# Patient Record
Sex: Female | Born: 1940
Health system: Southern US, Community
[De-identification: ages and names within clinical notes are randomized; demographics above are authoritative.]

## PROBLEM LIST (undated history)

## (undated) DIAGNOSIS — E785 Hyperlipidemia, unspecified: Secondary | ICD-10-CM

## (undated) DIAGNOSIS — I679 Cerebrovascular disease, unspecified: Secondary | ICD-10-CM

## (undated) DIAGNOSIS — I10 Essential (primary) hypertension: Secondary | ICD-10-CM

## (undated) DIAGNOSIS — Z8489 Family history of other specified conditions: Secondary | ICD-10-CM

## (undated) DIAGNOSIS — I2089 Other forms of angina pectoris: Secondary | ICD-10-CM

## (undated) DIAGNOSIS — K640 First degree hemorrhoids: Secondary | ICD-10-CM

## (undated) DIAGNOSIS — I6529 Occlusion and stenosis of unspecified carotid artery: Secondary | ICD-10-CM

## (undated) DIAGNOSIS — H547 Unspecified visual loss: Secondary | ICD-10-CM

## (undated) DIAGNOSIS — M51369 Other intervertebral disc degeneration, lumbar region without mention of lumbar back pain or lower extremity pain: Secondary | ICD-10-CM

## (undated) DIAGNOSIS — I208 Other forms of angina pectoris: Secondary | ICD-10-CM

## (undated) DIAGNOSIS — E7030 Albinism, unspecified: Secondary | ICD-10-CM

## (undated) DIAGNOSIS — Q2112 Patent foramen ovale: Secondary | ICD-10-CM

## (undated) DIAGNOSIS — M5136 Other intervertebral disc degeneration, lumbar region: Secondary | ICD-10-CM

## (undated) DIAGNOSIS — I639 Cerebral infarction, unspecified: Secondary | ICD-10-CM

## (undated) DIAGNOSIS — M5430 Sciatica, unspecified side: Secondary | ICD-10-CM

## (undated) DIAGNOSIS — F419 Anxiety disorder, unspecified: Secondary | ICD-10-CM

## (undated) DIAGNOSIS — I2510 Atherosclerotic heart disease of native coronary artery without angina pectoris: Secondary | ICD-10-CM

## (undated) DIAGNOSIS — M8580 Other specified disorders of bone density and structure, unspecified site: Secondary | ICD-10-CM

## (undated) DIAGNOSIS — G609 Hereditary and idiopathic neuropathy, unspecified: Secondary | ICD-10-CM

## (undated) DIAGNOSIS — K5732 Diverticulitis of large intestine without perforation or abscess without bleeding: Secondary | ICD-10-CM

## (undated) DIAGNOSIS — Q211 Atrial septal defect: Secondary | ICD-10-CM

## (undated) HISTORY — DX: Diverticulitis of large intestine without perforation or abscess without bleeding: K57.32

## (undated) HISTORY — PX: TUBAL LIGATION: SHX77

## (undated) HISTORY — DX: Patent foramen ovale: Q21.12

## (undated) HISTORY — DX: Albinism, unspecified: E70.30

## (undated) HISTORY — DX: Atrial septal defect: Q21.1

## (undated) HISTORY — DX: Other forms of angina pectoris: I20.8

## (undated) HISTORY — DX: Essential (primary) hypertension: I10

## (undated) HISTORY — PX: TONSILLECTOMY: SUR1361

## (undated) HISTORY — DX: Anxiety disorder, unspecified: F41.9

## (undated) HISTORY — DX: First degree hemorrhoids: K64.0

## (undated) HISTORY — DX: Hereditary and idiopathic neuropathy, unspecified: G60.9

## (undated) HISTORY — DX: Occlusion and stenosis of unspecified carotid artery: I65.29

## (undated) HISTORY — DX: Cerebrovascular disease, unspecified: I67.9

## (undated) HISTORY — DX: Other forms of angina pectoris: I20.89

## (undated) HISTORY — DX: Other specified disorders of bone density and structure, unspecified site: M85.80

---

## 1998-04-15 HISTORY — PX: OTHER SURGICAL HISTORY: SHX169

## 2002-07-05 ENCOUNTER — Ambulatory Visit (HOSPITAL_BASED_OUTPATIENT_CLINIC_OR_DEPARTMENT_OTHER): Admission: RE | Admit: 2002-07-05 | Discharge: 2002-07-05 | Payer: Self-pay | Admitting: Otolaryngology

## 2003-01-06 ENCOUNTER — Ambulatory Visit (HOSPITAL_COMMUNITY): Admission: RE | Admit: 2003-01-06 | Discharge: 2003-01-06 | Payer: Self-pay | Admitting: Family Medicine

## 2003-01-06 ENCOUNTER — Encounter: Payer: Self-pay | Admitting: Family Medicine

## 2004-02-21 ENCOUNTER — Other Ambulatory Visit: Admission: RE | Admit: 2004-02-21 | Discharge: 2004-02-21 | Payer: Self-pay | Admitting: Family Medicine

## 2007-10-13 ENCOUNTER — Other Ambulatory Visit: Admission: RE | Admit: 2007-10-13 | Discharge: 2007-10-13 | Payer: Self-pay | Admitting: Family Medicine

## 2010-08-31 NOTE — Op Note (Signed)
NAME:  Kerri Snyder, Kerri Snyder                          ACCOUNT NO.:  [id number]   MEDICAL RECORD NO.:  [id number]                   PATIENT TYPE:  AMB   LOCATION:  DSC                                  FACILITY:  [facility]   PHYSICIAN:  Jefry H. Pollyann Kennedy, M.D.                DATE OF BIRTH:  [birth year]   DATE OF PROCEDURE:  07/05/2002  DATE OF DISCHARGE:                                 OPERATIVE REPORT   PREOPERATIVE DIAGNOSIS:  Right tympanic membrane perforation with conductive  hearing loss.   POSTOPERATIVE DIAGNOSIS:  Right tympanic membrane perforation with  conductive hearing loss.   PROCEDURE:  Right tympanoplasty.   SURGEON:  Jefry H. Pollyann Kennedy, M.D.   ANESTHESIA:  General endotracheal anesthesia.   COMPLICATIONS:  None.   ESTIMATED BLOOD LOSS:  10 cc.   FINDINGS:  Approximately 40% central perforation of the posteroinferior pars  tensa and tympanic membrane. The middle ear was healthy and clear and the  ossicular chamber was intact. The patient tolerated the procedure well, was  awakened, extubated and transferred to the recovery room in stable  condition.   INDICATIONS:  The patient is a 70 year old lady with a long history of  intermittent drainage and significant hearing loss from the right ear.  The  risks, benefits and alternatives and complications of the procedure were  explained to the patient and she seemed to understand.   DESCRIPTION OF PROCEDURE:  The patient was taken to the operating room and  placed on the operating table in the supine position. Following induction of  general endotracheal anesthesia the table was turned 90 degrees. The patient  was prepped and draped in the standard fashion.   The right ear canal was injected with 1% Xylocaine with epinephrine in 4  quadrants with good blanching of  the tympanic membrane.  The postauricular  sulcus was injected as well. A vascular strip was created posteriorly as was  a tympanomeatal flap. The edges of  the  perforation were freshened with an  otologic pick and cup forceps to remove the curled under epithelium.   The postauricular incision was created with a #15 scalpel. The temporalis  areolar loose tissue was harvested, pressed and dried  on the back table. A  self-retaining retractor was used to hold the auricle forward. The linea  temporalis and maxillary periosteum were incised and dissected off of the  bone. This was brought forward with the auricle and lymphovascularly was  stripped.   The ear canal and tympanic membrane were now nicely visualized from the  postauricular  approach. The tympanomeatal flap was developed anteriorly and  cleaned off of the bony annulus. The chorda tympani nerve was identified and  preserved. The flap including the tympanic membrane was dissected sharply  forward off the chorda tympani and off the lateral process of the malleus.  The ossicular chain was inspected and was intact with good mobility.  There  was no erosion. The middle ear was otherwise clear.   The middle ear was packed with saline soaked Gelfoam including the  eustachian tube orifice. The dried graft was trimmed and cut with a notch to  wrap around the malleus. It was then inserted in a medial position and well  suited up against the undersurface of the tympanic membrane remnant. The  remainder of the middle ear was packed with more saline soaked Gelfoam to  provide good support of the graft.   The graft and the tympanomeatal flap were brought back to their normal  position. The graft was well seated with all edges of the perforation in  contact. The ear canal was packed with Cortisporin soaked Gelfoam. The  postauricular incision was reapproximated with subcuticular chromic suture  and Benzoin and Steri-Strips and a mastoid dressing was applied. The patient  was  then awakened, extubated and transferred to the recovery room.                                               Jefry H. Pollyann Kennedy,  M.D.    JHR/MEDQ  D:  07/05/2002  T:  07/05/2002  Job:  [id number]   cc:   Dario Guardian, M.D.  [address]  [place]  [place] [address]  Fax: [phone]

## 2011-03-06 ENCOUNTER — Other Ambulatory Visit: Payer: Self-pay | Admitting: Neurological Surgery

## 2011-03-06 DIAGNOSIS — M5410 Radiculopathy, site unspecified: Secondary | ICD-10-CM

## 2011-03-06 DIAGNOSIS — M47819 Spondylosis without myelopathy or radiculopathy, site unspecified: Secondary | ICD-10-CM

## 2011-03-06 NOTE — Patient Instructions (Signed)

## 2011-03-08 ENCOUNTER — Ambulatory Visit
Admission: RE | Admit: 2011-03-08 | Discharge: 2011-03-08 | Disposition: A | Discharge status: Home / Self Care | Payer: Self-pay | Source: Ambulatory Visit | Attending: Neurological Surgery | Admitting: Neurological Surgery

## 2011-03-08 DIAGNOSIS — M5410 Radiculopathy, site unspecified: Secondary | ICD-10-CM

## 2011-03-08 DIAGNOSIS — M47819 Spondylosis without myelopathy or radiculopathy, site unspecified: Secondary | ICD-10-CM

## 2011-03-08 MED ORDER — DIAZEPAM 5 MG PO TABS
5.0000 mg | ORAL_TABLET | Freq: Once | ORAL | Status: AC
  Administered 2011-03-08: 5 mg via ORAL

## 2011-03-08 MED ORDER — IOHEXOL 180 MG/ML  SOLN
17.0000 mL | Freq: Once | INTRAMUSCULAR | Status: AC | PRN
  Administered 2011-03-08: 17 mL via INTRAVENOUS

## 2011-03-08 NOTE — Progress Notes (Signed)
Son at bedside.  Patient resting quietly without complaint.  jkl

## 2012-04-12 ENCOUNTER — Ambulatory Visit (INDEPENDENT_AMBULATORY_CARE_PROVIDER_SITE_OTHER): Payer: Medicare Other | Admitting: Family Medicine

## 2012-04-12 VITALS — BP 161/81 | HR 89 | Temp 97.3°F | Resp 16 | Ht 64.0 in | Wt 136.0 lb

## 2012-04-12 DIAGNOSIS — M5430 Sciatica, unspecified side: Secondary | ICD-10-CM

## 2012-04-12 DIAGNOSIS — M791 Myalgia, unspecified site: Secondary | ICD-10-CM

## 2012-04-12 DIAGNOSIS — IMO0001 Reserved for inherently not codable concepts without codable children: Secondary | ICD-10-CM

## 2012-04-12 MED ORDER — HYDROCODONE-ACETAMINOPHEN 5-325 MG PO TABS: 1.0000 | ORAL_TABLET | Freq: Four times a day (QID) | ORAL | Status: DC | PRN

## 2012-04-12 MED ORDER — METHOCARBAMOL 500 MG PO TABS: 500.0000 mg | ORAL_TABLET | Freq: Four times a day (QID) | ORAL | Status: DC | PRN

## 2012-04-12 MED ORDER — NAPROXEN 500 MG PO TABS: 500.0000 mg | ORAL_TABLET | Freq: Two times a day (BID) | ORAL | Status: DC
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
## 2012-04-15 DIAGNOSIS — Z0271 Encounter for disability determination: Secondary | ICD-10-CM

## 2013-06-13 DIAGNOSIS — I639 Cerebral infarction, unspecified: Secondary | ICD-10-CM

## 2013-06-13 HISTORY — DX: Cerebral infarction, unspecified: I63.9

## 2013-06-14 ENCOUNTER — Encounter (HOSPITAL_COMMUNITY): Payer: Self-pay | Admitting: Emergency Medicine

## 2013-06-14 ENCOUNTER — Inpatient Hospital Stay (HOSPITAL_COMMUNITY): Payer: Medicare Other

## 2013-06-14 ENCOUNTER — Inpatient Hospital Stay (HOSPITAL_COMMUNITY)
Admission: EM | Admit: 2013-06-14 | Discharge: 2013-06-17 | DRG: 066 | Disposition: A | Discharge status: Home / Self Care | Payer: Medicare Other | Attending: Internal Medicine | Admitting: Internal Medicine

## 2013-06-14 ENCOUNTER — Emergency Department (HOSPITAL_COMMUNITY): Payer: Medicare Other

## 2013-06-14 DIAGNOSIS — M51379 Other intervertebral disc degeneration, lumbosacral region without mention of lumbar back pain or lower extremity pain: Secondary | ICD-10-CM | POA: Diagnosis present

## 2013-06-14 DIAGNOSIS — R791 Abnormal coagulation profile: Secondary | ICD-10-CM | POA: Diagnosis present

## 2013-06-14 DIAGNOSIS — G459 Transient cerebral ischemic attack, unspecified: Secondary | ICD-10-CM | POA: Diagnosis present

## 2013-06-14 DIAGNOSIS — R4789 Other speech disturbances: Secondary | ICD-10-CM | POA: Diagnosis present

## 2013-06-14 DIAGNOSIS — R4781 Slurred speech: Secondary | ICD-10-CM | POA: Diagnosis present

## 2013-06-14 DIAGNOSIS — R030 Elevated blood-pressure reading, without diagnosis of hypertension: Secondary | ICD-10-CM | POA: Diagnosis present

## 2013-06-14 DIAGNOSIS — I6529 Occlusion and stenosis of unspecified carotid artery: Secondary | ICD-10-CM | POA: Diagnosis present

## 2013-06-14 DIAGNOSIS — Z79899 Other long term (current) drug therapy: Secondary | ICD-10-CM

## 2013-06-14 DIAGNOSIS — E785 Hyperlipidemia, unspecified: Secondary | ICD-10-CM | POA: Diagnosis present

## 2013-06-14 DIAGNOSIS — I6350 Cerebral infarction due to unspecified occlusion or stenosis of unspecified cerebral artery: Principal | ICD-10-CM | POA: Diagnosis present

## 2013-06-14 DIAGNOSIS — Z7982 Long term (current) use of aspirin: Secondary | ICD-10-CM

## 2013-06-14 DIAGNOSIS — Z833 Family history of diabetes mellitus: Secondary | ICD-10-CM

## 2013-06-14 DIAGNOSIS — IMO0001 Reserved for inherently not codable concepts without codable children: Secondary | ICD-10-CM | POA: Diagnosis present

## 2013-06-14 DIAGNOSIS — H548 Legal blindness, as defined in USA: Secondary | ICD-10-CM | POA: Diagnosis present

## 2013-06-14 DIAGNOSIS — Z8249 Family history of ischemic heart disease and other diseases of the circulatory system: Secondary | ICD-10-CM

## 2013-06-14 DIAGNOSIS — I739 Peripheral vascular disease, unspecified: Secondary | ICD-10-CM | POA: Diagnosis present

## 2013-06-14 DIAGNOSIS — M5137 Other intervertebral disc degeneration, lumbosacral region: Secondary | ICD-10-CM | POA: Diagnosis present

## 2013-06-14 DIAGNOSIS — E782 Mixed hyperlipidemia: Secondary | ICD-10-CM | POA: Diagnosis present

## 2013-06-14 DIAGNOSIS — I639 Cerebral infarction, unspecified: Secondary | ICD-10-CM

## 2013-06-14 HISTORY — DX: Hyperlipidemia, unspecified: E78.5

## 2013-06-14 HISTORY — DX: Family history of other specified conditions: Z84.89

## 2013-06-14 HISTORY — DX: Other intervertebral disc degeneration, lumbar region without mention of lumbar back pain or lower extremity pain: M51.369

## 2013-06-14 HISTORY — DX: Unspecified visual loss: H54.7

## 2013-06-14 HISTORY — DX: Sciatica, unspecified side: M54.30

## 2013-06-14 HISTORY — DX: Other intervertebral disc degeneration, lumbar region: M51.36

## 2013-06-14 LAB — URINALYSIS, ROUTINE W REFLEX MICROSCOPIC
BILIRUBIN URINE: NEGATIVE
GLUCOSE, UA: NEGATIVE mg/dL
HGB URINE DIPSTICK: NEGATIVE
KETONES UR: NEGATIVE mg/dL
Leukocytes, UA: NEGATIVE
Nitrite: NEGATIVE
PH: 7 (ref 5.0–8.0)
PROTEIN: NEGATIVE mg/dL
Specific Gravity, Urine: 1.016 (ref 1.005–1.030)
Urobilinogen, UA: 0.2 mg/dL (ref 0.0–1.0)

## 2013-06-14 LAB — COMPREHENSIVE METABOLIC PANEL
ALT: 16 U/L (ref 0–35)
AST: 21 U/L (ref 0–37)
Albumin: 4 g/dL (ref 3.5–5.2)
Alkaline Phosphatase: 100 U/L (ref 39–117)
BUN: 19 mg/dL (ref 6–23)
CALCIUM: 9.9 mg/dL (ref 8.4–10.5)
CO2: 24 mEq/L (ref 19–32)
CREATININE: 0.72 mg/dL (ref 0.50–1.10)
Chloride: 103 mEq/L (ref 96–112)
GFR calc non Af Amer: 83 mL/min — ABNORMAL LOW (ref >90)
GLUCOSE: 104 mg/dL — AB (ref 70–99)
Potassium: 4.3 mEq/L (ref 3.7–5.3)
SODIUM: 141 meq/L (ref 137–147)
TOTAL PROTEIN: 7.6 g/dL (ref 6.0–8.3)
Total Bilirubin: 0.3 mg/dL (ref 0.3–1.2)

## 2013-06-14 LAB — DIFFERENTIAL
Basophils Absolute: 0 10*3/uL (ref 0.0–0.1)
Basophils Relative: 0 % (ref 0–1)
EOS ABS: 0.1 10*3/uL (ref 0.0–0.7)
EOS PCT: 1 % (ref 0–5)
LYMPHS ABS: 2.1 10*3/uL (ref 0.7–4.0)
Lymphocytes Relative: 26 % (ref 12–46)
MONO ABS: 0.8 10*3/uL (ref 0.1–1.0)
Monocytes Relative: 10 % (ref 3–12)
Neutro Abs: 5.1 10*3/uL (ref 1.7–7.7)
Neutrophils Relative %: 63 % (ref 43–77)

## 2013-06-14 LAB — RAPID URINE DRUG SCREEN, HOSP PERFORMED
AMPHETAMINES: NOT DETECTED
BENZODIAZEPINES: NOT DETECTED
Barbiturates: NOT DETECTED
COCAINE: NOT DETECTED
OPIATES: NOT DETECTED
Tetrahydrocannabinol: NOT DETECTED

## 2013-06-14 LAB — CBC
HCT: 42.6 % (ref 36.0–46.0)
Hemoglobin: 14.5 g/dL (ref 12.0–15.0)
MCH: 30.1 pg (ref 26.0–34.0)
MCHC: 34 g/dL (ref 30.0–36.0)
MCV: 88.6 fL (ref 78.0–100.0)
Platelets: 281 10*3/uL (ref 150–400)
RBC: 4.81 MIL/uL (ref 3.87–5.11)
RDW: 12.8 % (ref 11.5–15.5)
WBC: 8.1 10*3/uL (ref 4.0–10.5)

## 2013-06-14 LAB — APTT
APTT: 30 s (ref 24–37)
aPTT: >200 seconds (ref 24–37)

## 2013-06-14 LAB — I-STAT CHEM 8, ED
BUN: 20 mg/dL (ref 6–23)
Calcium, Ion: 1.21 mmol/L (ref 1.13–1.30)
Chloride: 107 mEq/L (ref 96–112)
Creatinine, Ser: 0.8 mg/dL (ref 0.50–1.10)
Glucose, Bld: 105 mg/dL — ABNORMAL HIGH (ref 70–99)
HEMATOCRIT: 43 % (ref 36.0–46.0)
HEMOGLOBIN: 14.6 g/dL (ref 12.0–15.0)
POTASSIUM: 3.9 meq/L (ref 3.7–5.3)
SODIUM: 143 meq/L (ref 137–147)
TCO2: 26 mmol/L (ref 0–100)

## 2013-06-14 LAB — PROTIME-INR
INR: >10 (ref 0.00–1.49)
INR: 0.93 (ref 0.00–1.49)
PROTHROMBIN TIME: 12.3 s (ref 11.6–15.2)

## 2013-06-14 LAB — I-STAT TROPONIN, ED: Troponin i, poc: 0 ng/mL (ref 0.00–0.08)

## 2013-06-14 LAB — CBG MONITORING, ED: Glucose-Capillary: 111 mg/dL — ABNORMAL HIGH (ref 70–99)

## 2013-06-14 LAB — ETHANOL: Alcohol, Ethyl (B): <11 mg/dL (ref 0–11)

## 2013-06-14 MED ORDER — ASPIRIN 325 MG PO TABS
325.0000 mg | ORAL_TABLET | Freq: Every day | ORAL | Status: DC
  Administered 2013-06-14 – 2013-06-15 (×2): 325 mg via ORAL
  Filled 2013-06-14 (×2): qty 1

## 2013-06-14 MED ORDER — ACETAMINOPHEN 325 MG PO TABS: 650.0000 mg | ORAL_TABLET | ORAL | Status: DC | PRN

## 2013-06-14 MED ORDER — SENNOSIDES-DOCUSATE SODIUM 8.6-50 MG PO TABS
1.0000 | ORAL_TABLET | Freq: Every evening | ORAL | Status: DC | PRN
  Filled 2013-06-14: qty 1

## 2013-06-14 MED ORDER — ACETAMINOPHEN 650 MG RE SUPP: 650.0000 mg | RECTAL | Status: DC | PRN

## 2013-06-14 MED ORDER — SODIUM CHLORIDE 0.9 % IV SOLN
INTRAVENOUS | Status: DC
  Administered 2013-06-14: 1000 mL via INTRAVENOUS

## 2013-06-14 MED ORDER — ASPIRIN 300 MG RE SUPP
300.0000 mg | Freq: Every day | RECTAL | Status: DC
  Filled 2013-06-14 (×2): qty 1

## 2013-06-14 MED ORDER — ATORVASTATIN CALCIUM 20 MG PO TABS
20.0000 mg | ORAL_TABLET | Freq: Every day | ORAL | Status: DC
  Filled 2013-06-14 (×2): qty 1

## 2013-06-14 NOTE — ED Provider Notes (Signed)
CSN: [id number]     Arrival date & time 06/14/13  1537 History   First MD Initiated Contact with Patient 06/14/13 1559     Chief Complaint  Patient presents with  . Stroke Symptoms     HPI Pt was seen at 1600. Per pt, c/o gradual onset and persistence of constant "slurred speech" since waking up this morning approx 0500/0530. States she noticed her "tongue wasn't working right" when she was saying her morning prayers. States she went to bed last night approx 2230 "just fine." Denies hx of same. Denies focal motor weakness, no tingling/numbness in extremities, no dysphagia/choking, no CP/palpitations, no SOB/cough, no abd pain, no N/V/D.    Past Medical History  Diagnosis Date  . Blind   . Hyperlipidemia   . DDD (degenerative disc disease), lumbar   . Sciatica    Past Surgical History  Procedure Laterality Date  . Tubal ligation    . Cesarean section     Family History  Problem Relation Age of Onset  . Diabetes Father   . Heart disease Father    History  Substance Use Topics  . Smoking status: Never Smoker   . Smokeless tobacco: Not on file  . Alcohol Use: No    Review of Systems ROS: Statement: All systems negative except as marked or noted in the HPI; Constitutional: Negative for fever and chills. ; ; Eyes: Negative for eye pain, redness and discharge. ; ; ENMT: Negative for ear pain, hoarseness, nasal congestion, sinus pressure and sore throat. ; ; Cardiovascular: Negative for chest pain, palpitations, diaphoresis, dyspnea and peripheral edema. ; ; Respiratory: Negative for cough, wheezing and stridor. ; ; Gastrointestinal: Negative for nausea, vomiting, diarrhea, abdominal pain, blood in stool, hematemesis, jaundice and rectal bleeding. . ; ; Genitourinary: Negative for dysuria, flank pain and hematuria. ; ; Musculoskeletal: Negative for back pain and neck pain. Negative for swelling and trauma.; ; Skin: Negative for pruritus, rash, abrasions, blisters, bruising and skin  lesion.; ; Neuro: +slurred speech. Negative for headache, lightheadedness and neck stiffness. Negative for weakness, altered level of consciousness , altered mental status, extremity weakness, paresthesias, involuntary movement, seizure and syncope.     Allergies  Review of patient's allergies indicates no known allergies.  Home Medications   Current Outpatient Rx  Name  Route  Sig  Dispense  Refill  . HYDROcodone-acetaminophen (NORCO/VICODIN) 5-325 MG per tablet   Oral   Take 1 tablet by mouth every 6 (six) hours as needed for pain.   15 tablet   0   . methocarbamol (ROBAXIN) 500 MG tablet   Oral   Take 1 tablet (500 mg total) by mouth 4 (four) times daily as needed (muscle spasm).   60 tablet   0   . naproxen (NAPROSYN) 500 MG tablet   Oral   Take 1 tablet (500 mg total) by mouth 2 (two) times daily with a meal.   60 tablet   0   . rosuvastatin (CRESTOR) 10 MG tablet   Oral   Take 10 mg by mouth daily.          BP 162/77  Pulse 98  Temp(Src) 97.9 F (36.6 C)  Resp 15  SpO2 100% Physical Exam 1605: Physical examination:  Nursing notes reviewed; Vital signs and O2 SAT reviewed;  Constitutional: Well developed, Well nourished, Well hydrated, In no acute distress; Head:  Normocephalic, atraumatic; Eyes: EOMI, PERRL, No scleral icterus; ENMT: Mouth and pharynx normal, Mucous membranes moist; Neck: Supple, Full  range of motion, No lymphadenopathy; Cardiovascular: Regular rate and rhythm, No gallop; Respiratory: Breath sounds clear & equal bilaterally, No rales, rhonchi, wheezes.  Speaking full sentences with ease, Normal respiratory effort/excursion; Chest: Nontender, Movement normal; Abdomen: Soft, Nontender, Nondistended, Normal bowel sounds; Genitourinary: No CVA tenderness; Extremities: Pulses normal, No tenderness, No edema, No calf edema or asymmetry.; Neuro: AA&Ox3, +blind per hx, otherwise major CN grossly intact. Speech mildly slurred.  No facial droop.  +bilateral  nystagmus. Grips unequal: right weaker than left. Strength 4/5 RUE, strength bilat LE's and LLE.  DTR 2/4 equal bilat UE's and LE's.  No gross sensory deficits.  Normal cerebellar testing bilat UE's (finger-nose) and LE's (heel-shin).; Skin: Color normal, Warm, Dry.   ED Course  Procedures     EKG Interpretation None      MDM  MDM Reviewed: previous chart, nursing note and vitals Reviewed previous: labs and ECG Interpretation: labs, ECG, x-ray and CT scan     Results for orders placed during the hospital encounter of 06/14/13  ETHANOL      Result Value Ref Range   Alcohol, Ethyl (B) <11  0 - 11 mg/dL  PROTIME-INR      Result Value Ref Range   Prothrombin Time >90.0 (*) 11.6 - 15.2 seconds   INR >10.00 (*) 0.00 - 1.49  APTT      Result Value Ref Range   aPTT >200 (*) 24 - 37 seconds  CBC      Result Value Ref Range   WBC 8.1  4.0 - 10.5 K/uL   RBC 4.81  3.87 - 5.11 MIL/uL   Hemoglobin 14.5  12.0 - 15.0 g/dL   HCT 16.1  09.6 - 04.5 %   MCV 88.6  78.0 - 100.0 fL   MCH 30.1  26.0 - 34.0 pg   MCHC 34.0  30.0 - 36.0 g/dL   RDW 40.9  81.1 - 91.4 %   Platelets 281  150 - 400 K/uL  DIFFERENTIAL      Result Value Ref Range   Neutrophils Relative % 63  43 - 77 %   Neutro Abs 5.1  1.7 - 7.7 K/uL   Lymphocytes Relative 26  12 - 46 %   Lymphs Abs 2.1  0.7 - 4.0 K/uL   Monocytes Relative 10  3 - 12 %   Monocytes Absolute 0.8  0.1 - 1.0 K/uL   Eosinophils Relative 1  0 - 5 %   Eosinophils Absolute 0.1  0.0 - 0.7 K/uL   Basophils Relative 0  0 - 1 %   Basophils Absolute 0.0  0.0 - 0.1 K/uL  COMPREHENSIVE METABOLIC PANEL      Result Value Ref Range   Sodium 141  137 - 147 mEq/L   Potassium 4.3  3.7 - 5.3 mEq/L   Chloride 103  96 - 112 mEq/L   CO2 24  19 - 32 mEq/L   Glucose, Bld 104 (*) 70 - 99 mg/dL   BUN 19  6 - 23 mg/dL   Creatinine, Ser 7.82  0.50 - 1.10 mg/dL   Calcium 9.9  8.4 - 95.6 mg/dL   Total Protein 7.6  6.0 - 8.3 g/dL   Albumin 4.0  3.5 - 5.2 g/dL   AST  21  0 - 37 U/L   ALT 16  0 - 35 U/L   Alkaline Phosphatase 100  39 - 117 U/L   Total Bilirubin 0.3  0.3 - 1.2 mg/dL   GFR calc  non Af Amer 83 (*) >90 mL/min   GFR calc Af Amer >90  >90 mL/min  I-STAT CHEM 8, ED      Result Value Ref Range   Sodium 143  137 - 147 mEq/L   Potassium 3.9  3.7 - 5.3 mEq/L   Chloride 107  96 - 112 mEq/L   BUN 20  6 - 23 mg/dL   Creatinine, Ser 0.980.80  0.50 - 1.10 mg/dL   Glucose, Bld 119105 (*) 70 - 99 mg/dL   Calcium, Ion 1.471.21  8.291.13 - 1.30 mmol/L   TCO2 26  0 - 100 mmol/L   Hemoglobin 14.6  12.0 - 15.0 g/dL   HCT 56.243.0  13.036.0 - 86.546.0 %  I-STAT TROPOININ, ED      Result Value Ref Range   Troponin i, poc 0.00  0.00 - 0.08 ng/mL   Comment 3           CBG MONITORING, ED      Result Value Ref Range   Glucose-Capillary 111 (*) 70 - 99 mg/dL   Ct Head Wo Contrast 7/8/46963/05/2013   CLINICAL DATA:  Onset of slurred speech this morning  EXAM: CT HEAD WITHOUT CONTRAST  TECHNIQUE: Contiguous axial images were obtained from the base of the skull through the vertex without intravenous contrast.  COMPARISON:  None.  FINDINGS: The ventricles are normal in size and position. There is mild age appropriate diffuse cerebral atrophy. There is no shift of the midline. There is no evidence of an acute intracranial hemorrhage nor of an evolving ischemic infarction. There are no abnormal intracranial calcifications. There are punctate basal ganglia calcifications bilaterally.  At bone window settings there are air-fluid levels within the maxillary and sphenoid sinuses as well as a left ethmoid sinus cell. The frontals sinuses are clear. The mastoid air cells are well pneumatized. There is no evidence of an acute skull fracture.  IMPRESSION: 1. There is mild age appropriate diffuse cerebral and cerebellar atrophy. 2. There is no evidence of an acute ischemic or hemorrhagic infarction. 3. The air-fluid levels and at mucoperiosteal thickening within the maxillary and sphenoid and left ethmoid sinus  cells is consistent with acute sinusitis.   Electronically Signed   By: David  [place]   On: 06/14/2013 16:40    1725:  Pt not code stroke or TPA candidate due to delay in presentation and pt woke up with symptoms/LKW last night.  Neuro exam unchanged. Pt ambulatory with steady gait, easy resps, NAD.  PT/INR/PTT elevated, but pt does not take coumadin; will re-check. MRI brain ordered. Dx and testing d/w pt.  Questions answered.  Verb understanding, agreeable to admit.T/C to [facility] Dr. Janee Mornhompson, case discussed, including:  HPI, pertinent PM/SHx, VS/PE, dx testing, ED course and treatment:  Agreeable to observation admit, requests to write temporary orders, call Neuro MD for consult, obtain tele bed to team 10.  T/C to Neuro MD, case discussed, including:  HPI, pertinent PM/SHx, VS/PE, dx testing, ED course and treatment:  Agreeable to consult.        Laray AngerKathleen M Caedan Sumler, DO 06/16/13 2018

## 2013-06-14 NOTE — ED Notes (Signed)
Pt ambulated to restroom without any difficulty.  Gait steady.

## 2013-06-14 NOTE — ED Notes (Signed)
Dr. Thompson at bedside. 

## 2013-06-14 NOTE — Consult Note (Addendum)
Referring Physician: Janee Mornhompson    Chief Complaint: Slurred speech  HPI: Kerri Snyder is an 73 y.o. female who reports going to bed normal last evening.  She awakened this morning at 0530 and noted slurred speech.  She describes it as feeling as if her tongue would not move fast enough.  She was saying her prayers and noticed that she sounded funny. Patient had breakfast and subsequently went to work.  At work her supervisor also noticed her slurred speech and recommended she present to the ED.  Her symptoms have improved somewhat since presentation.  She does not report any weakness or numbness.  Date last known well: Date: 06/13/2013 Time last known well: Time: 22:30 tPA Given: No: Outside time window  Past Medical History  Diagnosis Date  . Blind   . Hyperlipidemia   . DDD (degenerative disc disease), lumbar   . Sciatica     Past Surgical History  Procedure Laterality Date  . Tubal ligation    . Cesarean section      Family History  Problem Relation Age of Onset  . Diabetes Father   . Heart disease Father    Social History:  reports that she has never smoked. She does not have any smokeless tobacco history on file. She reports that she does not drink alcohol or use illicit drugs.  Allergies: No Known Allergies  Medications:  I have reviewed the patient's current medications. Prior to Admission:  Prescriptions prior to admission  Medication Sig Dispense Refill  . Ascorbic Acid (VITAMIN C PO) Take 1 tablet by mouth every morning.      Marland Kitchen. aspirin 81 MG tablet Take 81 mg by mouth every morning.      . Cholecalciferol (VITAMIN D PO) Take 1 tablet by mouth every morning.      . Cyanocobalamin (B-12 PO) Take 1 tablet by mouth every morning.      . pseudoephedrine (SUDAFED) 30 MG tablet Take 30 mg by mouth every 4 (four) hours as needed for congestion.      . rosuvastatin (CRESTOR) 10 MG tablet Take 10 mg by mouth at bedtime.        Scheduled:  ROS: History obtained from the  patient  General ROS: negative for - chills, fatigue, fever, night sweats, weight gain or weight loss Psychological ROS: negative for - behavioral disorder, hallucinations, memory difficulties, mood swings or suicidal ideation Ophthalmic ROS: poor vision ENT ROS: negative for - epistaxis, nasal discharge, oral lesions, sore throat, tinnitus or vertigo Allergy and Immunology ROS: negative for - hives or itchy/watery eyes Hematological and Lymphatic ROS: negative for - bleeding problems, bruising or swollen lymph nodes Endocrine ROS: negative for - galactorrhea, hair pattern changes, polydipsia/polyuria or temperature intolerance Respiratory ROS: negative for - cough, hemoptysis, shortness of breath or wheezing Cardiovascular ROS: negative for - chest pain, dyspnea on exertion, edema or irregular heartbeat Gastrointestinal ROS: negative for - abdominal pain, diarrhea, hematemesis, nausea/vomiting or stool incontinence Genito-Urinary ROS: negative for - dysuria, hematuria, incontinence or urinary frequency/urgency Musculoskeletal ROS: negative for - joint swelling or muscular weakness Neurological ROS: as noted in HPI Dermatological ROS: negative for rash and skin lesion changes  Physical Examination: Blood pressure 165/69, pulse 87, temperature 98.1 F (36.7 C), temperature source Oral, resp. rate 16, SpO2 99.00%.  Neurologic Examination: Mental Status: Alert, oriented, thought content appropriate.  Speech fluent without evidence of aphasia.  Speech mildly slurred.  Able to follow 3 step commands without difficulty. Cranial Nerves: II: Discs flat  bilaterally; Visual fields grossly normal, pupils equal, round, reactive to light and accommodation III,IV, VI: left ptosis, extra-ocular motions intact bilaterally with nystagmus in all positions V,VII: smile symmetric, facial light touch sensation normal bilaterally VIII: hearing normal bilaterally IX,X: gag reflex present XI: bilateral  shoulder shrug XII: midline tongue extension Motor: Right : Upper extremity   5/5 with pronator drift    Left:     Upper extremity   5/5  Lower extremity   5/5        Lower extremity   5/5 Tone and bulk:normal tone throughout; no atrophy noted Sensory: Pinprick and light touch intact throughout, bilaterally Deep Tendon Reflexes: 3+ and symmetric throughout Plantars: Right: mute   Left: mute Cerebellar: normal finger-to-nose and normal heel-to-shin test Gait: Unable to test CV: pulses palpable throughout    Laboratory Studies:  Basic Metabolic Panel:  Recent Labs Lab 06/14/13 1612 06/14/13 1631  NA 141 143  K 4.3 3.9  CL 103 107  CO2 24  --   GLUCOSE 104* 105*  BUN 19 20  CREATININE 0.72 0.80  CALCIUM 9.9  --     Liver Function Tests:  Recent Labs Lab 06/14/13 1612  AST 21  ALT 16  ALKPHOS 100  BILITOT 0.3  PROT 7.6  ALBUMIN 4.0   No results found for this basename: LIPASE, AMYLASE,  in the last 168 hours No results found for this basename: AMMONIA,  in the last 168 hours  CBC:  Recent Labs Lab 06/14/13 1612 06/14/13 1631  WBC 8.1  --   NEUTROABS 5.1  --   HGB 14.5 14.6  HCT 42.6 43.0  MCV 88.6  --   PLT 281  --     Cardiac Enzymes: No results found for this basename: CKTOTAL, CKMB, CKMBINDEX, TROPONINI,  in the last 168 hours  BNP: No components found with this basename: POCBNP,   CBG:  Recent Labs Lab 06/14/13 1551  GLUCAP 111*    Microbiology: No results found for this or any previous visit.  Coagulation Studies:  Recent Labs  06/14/13 1612 06/14/13 1724  LABPROT >90.0* 12.3  INR >10.00* 0.93    Urinalysis:  Recent Labs Lab 06/14/13 1727  COLORURINE YELLOW  LABSPEC 1.016  PHURINE 7.0  GLUCOSEU NEGATIVE  HGBUR NEGATIVE  BILIRUBINUR NEGATIVE  KETONESUR NEGATIVE  PROTEINUR NEGATIVE  UROBILINOGEN 0.2  NITRITE NEGATIVE  LEUKOCYTESUR NEGATIVE    Lipid Panel: No results found for this basename: chol, trig, hdl,  cholhdl, vldl, ldlcalc    HgbA1C:  No results found for this basename: HGBA1C    Urine Drug Screen:     Component Value Date/Time   LABOPIA NONE DETECTED 06/14/2013 1727   COCAINSCRNUR NONE DETECTED 06/14/2013 1727   LABBENZ NONE DETECTED 06/14/2013 1727   AMPHETMU NONE DETECTED 06/14/2013 1727   THCU NONE DETECTED 06/14/2013 1727   LABBARB NONE DETECTED 06/14/2013 1727    Alcohol Level:  Recent Labs Lab 06/14/13 1612  ETH <11    Other results: EKG: sinus rhythm at 94 bpm.  Imaging: Ct Head Wo Contrast  06/14/2013   CLINICAL DATA:  Onset of slurred speech this morning  EXAM: CT HEAD WITHOUT CONTRAST  TECHNIQUE: Contiguous axial images were obtained from the base of the skull through the vertex without intravenous contrast.  COMPARISON:  None.  FINDINGS: The ventricles are normal in size and position. There is mild age appropriate diffuse cerebral atrophy. There is no shift of the midline. There is no evidence of an acute  intracranial hemorrhage nor of an evolving ischemic infarction. There are no abnormal intracranial calcifications. There are punctate basal ganglia calcifications bilaterally.  At bone window settings there are air-fluid levels within the maxillary and sphenoid sinuses as well as a left ethmoid sinus cell. The frontals sinuses are clear. The mastoid air cells are well pneumatized. There is no evidence of an acute skull fracture.  IMPRESSION: 1. There is mild age appropriate diffuse cerebral and cerebellar atrophy. 2. There is no evidence of an acute ischemic or hemorrhagic infarction. 3. The air-fluid levels and at mucoperiosteal thickening within the maxillary and sphenoid and left ethmoid sinus cells is consistent with acute sinusitis.   Electronically Signed   By: David  [place]   On: 06/14/2013 16:40   Mr Maxine Glenn Head Wo Contrast  06/14/2013   CLINICAL DATA:  Slurred speech and right upper extremity weakness.  EXAM: MRI HEAD WITHOUT CONTRAST  MRA HEAD WITHOUT CONTRAST  TECHNIQUE:  Multiplanar, multiecho pulse sequences of the brain and surrounding structures were obtained without intravenous contrast. Angiographic images of the head were obtained using MRA technique without contrast.  COMPARISON:  Head CT 06/14/2013  FINDINGS: MRI HEAD FINDINGS  There is a small, acute infarct involving the left lentiform nucleus and corona radiata. Linear susceptibility artifact along the superior right cerebellar folia may reflect remote hemorrhage, possibly subarachnoid. There is no other evidence of intracranial hemorrhage.  Mild age-related cerebral atrophy is noted. Small, scattered foci of T2 hyperintensity within the subcortical and deep cerebral white matter are nonspecific but compatible with mild chronic small vessel ischemic disease. There is no evidence of mass, midline shift, or extra-axial fluid collection.  Major intracranial vascular flow voids appear preserved. There are bilateral mastoid effusions. A small amount of fluid is present within both maxillary sinuses. Mild mucosal thickening is present in the maxillary sinuses, sphenoid sinuses, and ethmoid air cells bilaterally. There is slight anterolisthesis of C3 on C4 and C4 on C5.  MRA HEAD FINDINGS  The visualized distal vertebral arteries are patent with the left being dominant. Right PICA origin is patent. Left PICA is not clearly identified. The left AICA is dominant. SCAs are patent. The proximal right PCA is tortuous but otherwise unremarkable. The left P1 segment is hypoplastic with a prominent left posterior communicating artery identified. The left PCA is otherwise unremarkable.  Internal carotid arteries are patent from skullbase to carotid termini. Small inferiorly directed outpouching from the supraclinoid right ICA measuring approximately 2 mm likely represents a P-comm infundibulum. ACAs and MCAs are unremarkable. There is a small, patent anterior communicating artery.  IMPRESSION: 1. Small, acute left basal ganglia infarct.  2. Mild chronic small vessel ischemic disease. 3. Bilateral mastoid effusions and bilateral paranasal sinus mucosal thickening and fluid as above. 4. No evidence of major intracranial arterial occlusion or flow limiting stenosis. 2 mm right P-comm infundibulum favored over small aneurysm.   Electronically Signed   By: Sebastian Ache   On: 06/14/2013 19:50   Mr Brain Wo Contrast  06/14/2013   CLINICAL DATA:  Slurred speech and right upper extremity weakness.  EXAM: MRI HEAD WITHOUT CONTRAST  MRA HEAD WITHOUT CONTRAST  TECHNIQUE: Multiplanar, multiecho pulse sequences of the brain and surrounding structures were obtained without intravenous contrast. Angiographic images of the head were obtained using MRA technique without contrast.  COMPARISON:  Head CT 06/14/2013  FINDINGS: MRI HEAD FINDINGS  There is a small, acute infarct involving the left lentiform nucleus and corona radiata. Linear susceptibility artifact along the  superior right cerebellar folia may reflect remote hemorrhage, possibly subarachnoid. There is no other evidence of intracranial hemorrhage.  Mild age-related cerebral atrophy is noted. Small, scattered foci of T2 hyperintensity within the subcortical and deep cerebral white matter are nonspecific but compatible with mild chronic small vessel ischemic disease. There is no evidence of mass, midline shift, or extra-axial fluid collection.  Major intracranial vascular flow voids appear preserved. There are bilateral mastoid effusions. A small amount of fluid is present within both maxillary sinuses. Mild mucosal thickening is present in the maxillary sinuses, sphenoid sinuses, and ethmoid air cells bilaterally. There is slight anterolisthesis of C3 on C4 and C4 on C5.  MRA HEAD FINDINGS  The visualized distal vertebral arteries are patent with the left being dominant. Right PICA origin is patent. Left PICA is not clearly identified. The left AICA is dominant. SCAs are patent. The proximal right PCA is  tortuous but otherwise unremarkable. The left P1 segment is hypoplastic with a prominent left posterior communicating artery identified. The left PCA is otherwise unremarkable.  Internal carotid arteries are patent from skullbase to carotid termini. Small inferiorly directed outpouching from the supraclinoid right ICA measuring approximately 2 mm likely represents a P-comm infundibulum. ACAs and MCAs are unremarkable. There is a small, patent anterior communicating artery.  IMPRESSION: 1. Small, acute left basal ganglia infarct. 2. Mild chronic small vessel ischemic disease. 3. Bilateral mastoid effusions and bilateral paranasal sinus mucosal thickening and fluid as above. 4. No evidence of major intracranial arterial occlusion or flow limiting stenosis. 2 mm right P-comm infundibulum favored over small aneurysm.   Electronically Signed   By: Sebastian Ache   On: 06/14/2013 19:50    Assessment: 73 y.o. female presenting with a complaint of slurred speech.  Neurological examination is otherwise unremarkable other than a right pronator drift.  Patient outside time window for tPA.  Head CT reviewed and shows no acute changes.  MRI of the brain reviewed as well and shows an acute left basal ganglia infarct.  MRA shows no significant stenosis.  Further work up recommended.  Stroke Risk Factors - hyperlipidemia  Plan: 1. HgbA1c, fasting lipid panel 2. PT consult, OT consult, Speech consult 3. Echocardiogram 4. Carotid dopplers 5. Prophylactic therapy-Antiplatelet med: Aspirin - dose 325mg  daily 6. Risk factor modification 7. Telemetry monitoring 8. Frequent neuro checks   Thana Farr, MD [facility] [phone] 06/14/2013, 8:38 PM

## 2013-06-14 NOTE — ED Notes (Signed)
Kim RN from floor aware pt will go to MRI then to floor. MRI aware to contact ED staff when pt completed MRI to take to floor. Pt belongings with patient to MRI. Pt tele transported to MRI by Lafe GarinBarb J. RN

## 2013-06-14 NOTE — ED Notes (Signed)
CBG 111 

## 2013-06-14 NOTE — ED Notes (Signed)
Lab called for redraw PT/PTT/INR results sts appx 20 min to result. Dr. Janee Mornhompson made aware sts patient can still go up to floor. sts patient can have heart healthy diet.   MRI called sts can send for patient now.

## 2013-06-14 NOTE — ED Notes (Signed)
CRITICAL LAB VALUES  PT >90  INR >10  PTT >200  Dr. Clarene DukeMcmanus made aware.

## 2013-06-14 NOTE — H&P (Signed)
[facility] History and Physical  Kerri CaffeyDoris J Snyder ZOX:[id number] DOB: Aug 17, 1940 DOA: 06/14/2013  Referring physician: Dr. Clarene DukeMcManus PCP: Allean FoundSMITH,CANDACE THIELE, MD   Chief Complaint: Slurred speech  HPI: Kerri CaffeyDoris J Snyder is a 73 y.o. female  With history of legal blindness, hyperlipidemia, degenerative disc disease who presents to the ED with slurred speech. The patient stated she went to bed around 10:30 PM the night prior to admission and awoke up around 5 AM on the morning of admission. Patient stated that was saying her prayers she noticed she had slurred speech and sounded funny. Patient stated she breakfast subsequently went to work and was at work the supervisor also noticed a slurred speech and recommended she present to the ED. Patient denies any asymmetric weakness. No facial droop. No weakness. No other associated symptoms. Patient denies any fevers, no chills, no chest pain, no shortness of breath, no nausea, no vomiting, no abdominal pain, no diarrhea, no constipation, no dysuria, no weakness, no cough. Patient states he takes a baby aspirin intermittently. Patient states has had some improvement with speech. Patient was seen in the ED secondary to delay in presentation was deemed not a TPA candidate. CT of the head which was obtained was negative for any acute abnormalities. Compressive metabolic profile obtained was unremarkable. CBC obtained was unremarkable. INR obtained was greater than 10. APTT was greater than 200. Urinalysis was negative. We were called to admit the patient for further evaluation and management.   Review of Systems: As per history of present illness otherwise negative. Constitutional:  No weight loss, night sweats, Fevers, chills, fatigue.  HEENT:  No headaches, Difficulty swallowing,Tooth/dental problems,Sore throat,  No sneezing, itching, ear ache, nasal congestion, post nasal drip,  Cardio-vascular:  No chest pain, Orthopnea, PND, swelling in lower  extremities, anasarca, dizziness, palpitations  GI:  No heartburn, indigestion, abdominal pain, nausea, vomiting, diarrhea, change in bowel habits, loss of appetite  Resp:  No shortness of breath with exertion or at rest. No excess mucus, no productive cough, No non-productive cough, No coughing up of blood.No change in color of mucus.No wheezing.No chest wall deformity  Skin:  no rash or lesions.  GU:  no dysuria, change in color of urine, no urgency or frequency. No flank pain.  Musculoskeletal:  No joint pain or swelling. No decreased range of motion. No back pain.  Psych:  No change in mood or affect. No depression or anxiety. No memory loss.   Past Medical History  Diagnosis Date  . Blind   . Hyperlipidemia   . DDD (degenerative disc disease), lumbar   . Sciatica    Past Surgical History  Procedure Laterality Date  . Tubal ligation    . Cesarean section     Social History:  reports that she has never smoked. She does not have any smokeless tobacco history on file. She reports that she does not drink alcohol or use illicit drugs.  No Known Allergies  Family History  Problem Relation Age of Onset  . Diabetes Father   . Heart disease Father      Prior to Admission medications   Medication Sig Start Date End Date Taking? Authorizing Provider  Ascorbic Acid (VITAMIN C PO) Take 1 tablet by mouth every morning.   Yes Historical Provider, MD  aspirin 81 MG tablet Take 81 mg by mouth every morning.   Yes Historical Provider, MD  Cholecalciferol (VITAMIN D PO) Take 1 tablet by mouth every morning.   Yes Historical Provider, MD  Cyanocobalamin (B-12 PO) Take 1 tablet by mouth every morning.   Yes Historical Provider, MD  pseudoephedrine (SUDAFED) 30 MG tablet Take 30 mg by mouth every 4 (four) hours as needed for congestion.   Yes Historical Provider, MD  rosuvastatin (CRESTOR) 10 MG tablet Take 10 mg by mouth at bedtime.    Yes Historical Provider, MD   Physical Exam: Filed  Vitals:   06/14/13 1816  BP: 165/69  Pulse:   Temp:   Resp: 16    BP 165/69  Pulse 87  Temp(Src) 98.1 F (36.7 C) (Oral)  Resp 16  SpO2 99%  General:  Appears calm and comfortable. Sitting on gurney in no acute distress with some slight dysarthria. Eyes: PERRLA, EOMI, normal lids, irises & conjunctiva ENT: grossly normal hearing, lips & tongue Neck: no LAD, masses or thyromegaly Cardiovascular: RRR, no m/r/g. No LE edema. Telemetry: SR, no arrhythmias  Respiratory: CTA bilaterally, no w/r/r. Normal respiratory effort. Abdomen: soft, ntnd, positive bowel sounds, no rebound, no guarding. Skin: no rash or induration seen on limited exam Musculoskeletal: grossly normal tone BUE/BLE Psychiatric: grossly normal mood and affect, speech fluent and appropriate Neurologic: Alert and oriented x3. Cranial nerves II through XII are grossly intact. Sensation is intact. 2+ bilateral brachial radialis reflexes. 2+ bilateral radial reflexes. Patellar reflexes are hyperactive. Unable to obtain Achilles reflexes symmetrically. Unable to assess visual field secondary to patient's blindness. Gait not tested secondary to safety.          Labs on Admission:  Basic Metabolic Panel:  Recent Labs Lab 06/14/13 1612 06/14/13 1631  NA 141 143  K 4.3 3.9  CL 103 107  CO2 24  --   GLUCOSE 104* 105*  BUN 19 20  CREATININE 0.72 0.80  CALCIUM 9.9  --    Liver Function Tests:  Recent Labs Lab 06/14/13 1612  AST 21  ALT 16  ALKPHOS 100  BILITOT 0.3  PROT 7.6  ALBUMIN 4.0   No results found for this basename: LIPASE, AMYLASE,  in the last 168 hours No results found for this basename: AMMONIA,  in the last 168 hours CBC:  Recent Labs Lab 06/14/13 1612 06/14/13 1631  WBC 8.1  --   NEUTROABS 5.1  --   HGB 14.5 14.6  HCT 42.6 43.0  MCV 88.6  --   PLT 281  --    Cardiac Enzymes: No results found for this basename: CKTOTAL, CKMB, CKMBINDEX, TROPONINI,  in the last 168 hours  BNP  (last 3 results) No results found for this basename: PROBNP,  in the last 8760 hours CBG:  Recent Labs Lab 06/14/13 1551  GLUCAP 111*    Radiological Exams on Admission: Ct Head Wo Contrast  06/14/2013   CLINICAL DATA:  Onset of slurred speech this morning  EXAM: CT HEAD WITHOUT CONTRAST  TECHNIQUE: Contiguous axial images were obtained from the base of the skull through the vertex without intravenous contrast.  COMPARISON:  None.  FINDINGS: The ventricles are normal in size and position. There is mild age appropriate diffuse cerebral atrophy. There is no shift of the midline. There is no evidence of an acute intracranial hemorrhage nor of an evolving ischemic infarction. There are no abnormal intracranial calcifications. There are punctate basal ganglia calcifications bilaterally.  At bone window settings there are air-fluid levels within the maxillary and sphenoid sinuses as well as a left ethmoid sinus cell. The frontals sinuses are clear. The mastoid air cells are well pneumatized. There is no  evidence of an acute skull fracture.  IMPRESSION: 1. There is mild age appropriate diffuse cerebral and cerebellar atrophy. 2. There is no evidence of an acute ischemic or hemorrhagic infarction. 3. The air-fluid levels and at mucoperiosteal thickening within the maxillary and sphenoid and left ethmoid sinus cells is consistent with acute sinusitis.   Electronically Signed   By: David  [place]   On: 06/14/2013 16:40    EKG: Independently reviewed. NSR  Assessment/Plan Principal Problem:   Slurred speech Active Problems:   TIA (transient ischemic attack)   Elevated blood pressure   Hyperlipidemia   Supratherapeutic INR  #1 slurred speech Patient presented with slurred speech with delayed presentation and a such not a TPA candidate. Patient with history of hyperlipidemia. CT of the head was negative. We'll admit patient to telemetry floor. On the motor stroke workup including MRI MRA of the head.  Carotid Dopplers. 2-D echo. PT/OT/ST. Check a fasting lipid panel. Check a hemoglobin A1c. Continue home dose statin. Place on full dose aspirin. Neurology consultation pending.  #2 hyperlipidemia Check a fasting lipid panel. Continue home dose statin.  #3 supratherapeutic INR Questionable etiology. Likely an error. Patient is not on anti-coagulation. Patient's LFTs are within normal limits. Repeat PT/INR.  #4 elevated blood pressure Patient with no history of hypertension. We'll monitor for now. No need for any antihypertensives at this time.  #5 prophylaxis SCDs for DVT prophylaxis.  Code Status: Full Family Communication: Updated patient no family present. Disposition Plan: Admit to telemetry  Time spent: 47 MINS  [facility] MD [facility] Pager [phone]

## 2013-06-14 NOTE — ED Notes (Signed)
Pt sts she went to bed last night at about 1030 with normal speech and awoke with slurred speech at 5 am.  Pt sts speech still does not sound normal.  Pt with slurred speech on arrival.  Nystagmus noted by EMS, pt is legally blind.

## 2013-06-15 ENCOUNTER — Observation Stay (HOSPITAL_COMMUNITY): Payer: Medicare Other

## 2013-06-15 DIAGNOSIS — I059 Rheumatic mitral valve disease, unspecified: Secondary | ICD-10-CM

## 2013-06-15 DIAGNOSIS — I6350 Cerebral infarction due to unspecified occlusion or stenosis of unspecified cerebral artery: Secondary | ICD-10-CM

## 2013-06-15 DIAGNOSIS — R4789 Other speech disturbances: Secondary | ICD-10-CM

## 2013-06-15 DIAGNOSIS — G459 Transient cerebral ischemic attack, unspecified: Secondary | ICD-10-CM

## 2013-06-15 LAB — CBC
HCT: 37.8 % (ref 36.0–46.0)
HEMOGLOBIN: 12.7 g/dL (ref 12.0–15.0)
MCH: 29.7 pg (ref 26.0–34.0)
MCHC: 33.6 g/dL (ref 30.0–36.0)
MCV: 88.5 fL (ref 78.0–100.0)
Platelets: 258 10*3/uL (ref 150–400)
RBC: 4.27 MIL/uL (ref 3.87–5.11)
RDW: 12.8 % (ref 11.5–15.5)
WBC: 6.2 10*3/uL (ref 4.0–10.5)

## 2013-06-15 LAB — COMPREHENSIVE METABOLIC PANEL
ALT: 11 U/L (ref 0–35)
AST: 16 U/L (ref 0–37)
Albumin: 3.2 g/dL — ABNORMAL LOW (ref 3.5–5.2)
Alkaline Phosphatase: 81 U/L (ref 39–117)
BILIRUBIN TOTAL: 0.4 mg/dL (ref 0.3–1.2)
BUN: 15 mg/dL (ref 6–23)
CALCIUM: 9.2 mg/dL (ref 8.4–10.5)
CHLORIDE: 107 meq/L (ref 96–112)
CO2: 23 mEq/L (ref 19–32)
Creatinine, Ser: 0.63 mg/dL (ref 0.50–1.10)
GFR, EST NON AFRICAN AMERICAN: 86 mL/min — AB (ref >90)
GLUCOSE: 91 mg/dL (ref 70–99)
Potassium: 3.9 mEq/L (ref 3.7–5.3)
Sodium: 144 mEq/L (ref 137–147)
Total Protein: 6.5 g/dL (ref 6.0–8.3)

## 2013-06-15 LAB — LIPID PANEL
CHOLESTEROL: 176 mg/dL (ref 0–200)
HDL: 59 mg/dL (ref >39)
LDL Cholesterol: 104 mg/dL — ABNORMAL HIGH (ref 0–99)
Total CHOL/HDL Ratio: 3 RATIO
Triglycerides: 64 mg/dL (ref <150)
VLDL: 13 mg/dL (ref 0–40)

## 2013-06-15 LAB — PROTIME-INR
INR: 1.04 (ref 0.00–1.49)
Prothrombin Time: 13.4 seconds (ref 11.6–15.2)

## 2013-06-15 LAB — HEMOGLOBIN A1C
HEMOGLOBIN A1C: 5.8 % — AB (ref <5.7)
MEAN PLASMA GLUCOSE: 120 mg/dL — AB (ref <117)

## 2013-06-15 MED ORDER — ENOXAPARIN SODIUM 40 MG/0.4ML ~~LOC~~ SOLN
40.0000 mg | SUBCUTANEOUS | Status: DC
  Administered 2013-06-15 – 2013-06-16 (×2): 40 mg via SUBCUTANEOUS
  Filled 2013-06-15 (×3): qty 0.4

## 2013-06-15 MED ORDER — CLOPIDOGREL BISULFATE 75 MG PO TABS
75.0000 mg | ORAL_TABLET | Freq: Every day | ORAL | Status: DC
  Administered 2013-06-16 – 2013-06-17 (×2): 75 mg via ORAL
  Filled 2013-06-15 (×2): qty 1

## 2013-06-15 MED ORDER — STROKE: EARLY STAGES OF RECOVERY BOOK
Freq: Once | Status: AC
  Administered 2013-06-15: 08:00:00
  Filled 2013-06-15: qty 1

## 2013-06-15 NOTE — Progress Notes (Signed)
  Echocardiogram 2D Echocardiogram has been performed.  Kerri Snyder [name] 06/15/2013, 11:04 AM

## 2013-06-15 NOTE — Evaluation (Signed)
Speech Language Pathology Evaluation Patient Details Name: Kerri CaffeyDoris J Eardley MRN: [id number] DOB: 05/24/1940 Today's Date: 06/15/2013 Time: 1530-     Problem List:  Patient Active Problem List   Diagnosis Date Noted  . TIA (transient ischemic attack) 06/14/2013  . Slurred speech 06/14/2013  . Elevated blood pressure 06/14/2013  . Hyperlipidemia 06/14/2013  . Supratherapeutic INR 06/14/2013  . CVA (cerebral infarction) 06/14/2013   Past Medical History:  Past Medical History  Diagnosis Date  . Blind   . Hyperlipidemia   . DDD (degenerative disc disease), lumbar   . Sciatica    Past Surgical History:  Past Surgical History  Procedure Laterality Date  . Tubal ligation    . Cesarean section     HPI:   Kerri CaffeyDoris J Mun is an 73 y.o. female who reports going to bed normal last evening.  She awakened this morning at 0530 and noted slurred speech.  She describes it as feeling as if her tongue would not move fast enough.  She was saying her prayers and noticed that she sounded funny. Patient had breakfast and subsequently went to work.  At work her supervisor also noticed her slurred speech and recommended she present to the ED.  Her symptoms have improved somewhat since presentation.  She does not report any weakness or numbness   Assessment / Plan / Recommendation Clinical Impression  Patient with only occassional mild slurring noted during conversational speech, which appears to be resolving.  Speech is 100% intelligible in conversation, and no receptive or expressive aphasia is evident.  Pt. is very independent, despite being legally blind.  She works 40 hours per week for [employer], with her son driving her to work.  Pt. takes the bus home.  Compensatory strategies were reviewed, and pt. was able to demonstrate understanding (slow rate, over-articulate, and increase volume).  Pt. appears to be near baseline at this time.  No f/u ST needed (patient agrees).    SLP  Assessment  Patient does not need any further Speech Lanaguage Pathology Services    Follow Up Recommendations       Frequency and Duration        Pertinent Vitals/Pain n/a   SLP Goals     SLP Evaluation Prior Functioning  Cognitive/Linguistic Baseline: Within functional limits Type of Home: House Available Help at Discharge: Family;Available PRN/intermittently Vocation: Full time employment   Cognition  Overall Cognitive Status: Within Functional Limits for tasks assessed Arousal/Alertness: Awake/alert Orientation Level: Oriented X4 Memory: Appears intact Awareness: Appears intact Problem Solving: Appears intact Safety/Judgment: Appears intact    Comprehension  Auditory Comprehension Overall Auditory Comprehension: Appears within functional limits for tasks assessed    Expression Expression Primary Mode of Expression: Verbal Verbal Expression Overall Verbal Expression: Appears within functional limits for tasks assessed Initiation: No impairment Repetition: No impairment Naming: No impairment Pragmatics: No impairment Effective Techniques: Open ended questions Non-Verbal Means of Communication: Not applicable Written Expression Dominant Hand: Right Written Expression: Not tested   Oral / Motor Oral Motor/Sensory Function Overall Oral Motor/Sensory Function: Appears within functional limits for tasks assessed Motor Speech Overall Motor Speech: Impaired Respiration: Within functional limits Phonation: Normal Resonance: Within functional limits Articulation: Impaired Level of Impairment: Conversation Intelligibility: Intelligible Motor Planning: Witnin functional limits Motor Speech Errors: Not applicable Effective Techniques: Slow rate;Increased vocal intensity;Over-articulate   GO     Maryjo RochesterWillis, Analynn Daum T 06/15/2013, 4:02 PM

## 2013-06-15 NOTE — Progress Notes (Signed)
Utilization review completed.  

## 2013-06-15 NOTE — Progress Notes (Addendum)
Stroke Team Progress Note  HISTORY Kerri Snyder is an 73 y.o. female who reports going to bed normal last evening. She awakened this morning at 0530 and noted slurred speech. She describes it as feeling as if her tongue would not move fast enough. She was saying her prayers and noticed that she sounded funny. Patient had breakfast and subsequently went to work. At work her supervisor also noticed her slurred speech and recommended she present to the ED. Her symptoms have improved somewhat since presentation. She does not report any weakness or numbness.  last known well: 06/13/2013 at 22:30 Patient was not administerd TPA secondary to delay in arrival. She was admitted for further evaluation and treatment.  SUBJECTIVE No family is at the bedside.  Overall she feels her condition is stable. She woke with slurred speech and did not come to the ED. Her supervisor talked her into coming to the hospital.  OBJECTIVE Most recent Vital Signs: Filed Vitals:   06/15/13 0000 06/15/13 0200 06/15/13 0355 06/15/13 0600  BP: 129/93 142/60 151/73 144/56  Pulse: 79 75 78 85  Temp: 97.9 F (36.6 C) 98.2 F (36.8 C) 98 F (36.7 C) 98 F (36.7 C)  TempSrc:      Resp: 16 16 16 16   Height:      Weight:      SpO2: 99% 98% 98% 98%   CBG (last 3)   Recent Labs  06/14/13 1551  GLUCAP 111*    IV Fluid Intake:   . sodium chloride 1,000 mL (06/14/13 2231)    MEDICATIONS  .  stroke: mapping our early stages of recovery book   Does not apply Once  . aspirin  300 mg Rectal Daily   Or  . aspirin  325 mg Oral Daily  . atorvastatin  20 mg Oral q1800   PRN:  acetaminophen, acetaminophen, senna-docusate  Diet:  Cardiac thin liquids Activity:  OOB with assistance DVT Prophylaxis:  Lovenox 40 mg sq daily   CLINICALLY SIGNIFICANT STUDIES Basic Metabolic Panel:  Recent Labs Lab 06/14/13 1612 06/14/13 1631 06/15/13 0250  NA 141 143 144  K 4.3 3.9 3.9  CL 103 107 107  CO2 24  --  23  GLUCOSE 104*  105* 91  BUN 19 20 15   CREATININE 0.72 0.80 0.63  CALCIUM 9.9  --  9.2   Liver Function Tests:  Recent Labs Lab 06/14/13 1612 06/15/13 0250  AST 21 16  ALT 16 11  ALKPHOS 100 81  BILITOT 0.3 0.4  PROT 7.6 6.5  ALBUMIN 4.0 3.2*   CBC:  Recent Labs Lab 06/14/13 1612 06/14/13 1631 06/15/13 0250  WBC 8.1  --  6.2  NEUTROABS 5.1  --   --   HGB 14.5 14.6 12.7  HCT 42.6 43.0 37.8  MCV 88.6  --  88.5  PLT 281  --  258   Coagulation:  Recent Labs Lab 06/14/13 1612 06/14/13 1724 06/15/13 0250  LABPROT >90.0* 12.3 13.4  INR >10.00* 0.93 1.04   Cardiac Enzymes: No results found for this basename: CKTOTAL, CKMB, CKMBINDEX, TROPONINI,  in the last 168 hours Urinalysis:  Recent Labs Lab 06/14/13 1727  COLORURINE YELLOW  LABSPEC 1.016  PHURINE 7.0  GLUCOSEU NEGATIVE  HGBUR NEGATIVE  BILIRUBINUR NEGATIVE  KETONESUR NEGATIVE  PROTEINUR NEGATIVE  UROBILINOGEN 0.2  NITRITE NEGATIVE  LEUKOCYTESUR NEGATIVE   Lipid Panel    Component Value Date/Time   CHOL 176 06/15/2013 0250   TRIG 64 06/15/2013 0250  HDL 59 06/15/2013 0250   CHOLHDL 3.0 06/15/2013 0250   VLDL 13 06/15/2013 0250   LDLCALC 104* 06/15/2013 0250   HgbA1C  No results found for this basename: HGBA1C    Urine Drug Screen:     Component Value Date/Time   LABOPIA NONE DETECTED 06/14/2013 1727   COCAINSCRNUR NONE DETECTED 06/14/2013 1727   LABBENZ NONE DETECTED 06/14/2013 1727   AMPHETMU NONE DETECTED 06/14/2013 1727   THCU NONE DETECTED 06/14/2013 1727   LABBARB NONE DETECTED 06/14/2013 1727    Alcohol Level:  Recent Labs Lab 06/14/13 1612  ETH <11    CT of the brain  06/14/2013   1. There is mild age appropriate diffuse cerebral and cerebellar atrophy. 2. There is no evidence of an acute ischemic or hemorrhagic infarction. 3. The air-fluid levels and at mucoperiosteal thickening within the maxillary and sphenoid and left ethmoid sinus cells is consistent with acute sinusitis.     MRI of the brain  06/14/2013   1.  Small, acute left basal ganglia infarct. 2. Mild chronic small vessel ischemic disease. 3. Bilateral mastoid effusions and bilateral paranasal sinus mucosal thickening and fluid   MRA of the brain  06/14/2013   No evidence of major intracranial arterial occlusion or flow limiting stenosis. 2 mm right P-comm infundibulum favored over small aneurysm.     2D Echocardiogram    Carotid Doppler    CXR  06/15/2013   There is hyperinflation consistent with COPD. There is no evidence of atelectasis, pneumonia, CHF, or other acute cardiopulmonary disease.     EKG  normal sinus rhythm. For complete results please see formal report.   Therapy Recommendations no PT  Physical Exam   Awake alert. Afebrile. Head is nontraumatic. Neck is supple without bruit. Hearing is normal. Cardiac exam no murmur or gallop. Lungs are clear to auscultation. Distal pulses are well felt. Neurological Exam ;  Awake  Alert oriented x 3. Normal speech and language.eye movements full without nystagmus.fundi were not visualized. Vision acuity and fields appear normal. Hearing is normal. Palatal movements are normal. Face symmetric. Tongue midline. Normal strength, tone, reflexes and coordination. Normal sensation. Gait deferred. ASSESSMENT Ms. Kerri CaffeyDoris J Snyder is a 73 y.o. female presenting with slurred speech.  Imaging confirms a left basal ganglia infarct. Given location, infarct most likely thrombotic secondary to small vessel disease.  On aspirin 81 mg x 2 po daily prior to admission. Now on aspirin 325 mg orally every day for secondary stroke prevention. Patient with resultant slurred speech resolved. Work up underway.  Hyperlipidemia, LDL 104, on crestor PTA, now on crestor, goal LDL < 100   Legally blind 16/10920/200  [place]  Hospital day # 1  TREATMENT/PLAN  Change aspirin to clopidogrel 75 mg orally every day for secondary stroke prevention.  Follow up 2D echo and carotid doppler  Therapy evals  Annie MainSHARON BIBY, MSN, RN,  ANVP-BC, ANP-BC, GNP-BC [facility] Pager: [phone] 06/15/2013 9:41 AM  I have personally obtained a history, examined the patient, evaluated imaging results, and formulated the assessment and plan of care. I agree with the above. Delia HeadyPramod Izayiah Tibbitts, MD

## 2013-06-15 NOTE — Progress Notes (Signed)
VASCULAR LAB PRELIMINARY  PRELIMINARY  PRELIMINARY  PRELIMINARY  Carotid duplex completed.    Preliminary report:  Right:  40-59% internal carotid artery stenosis.  Left:  60-79% internal carotid artery stenosis.  Bilateral vertebral artery flow is antegrade.  Leslye Puccini, RVS 06/15/2013, 4:56 PM

## 2013-06-15 NOTE — Progress Notes (Signed)
Attempted to see pt.  Pt at baseline except for slurred speech.  No OT needs.  Family and pt educated on stroke signs and symptoms Tory EmeraldHolly Monya Kozakiewicz, OTR/L [phone]

## 2013-06-15 NOTE — Progress Notes (Signed)
[facility] PROGRESS NOTE  Kerri CaffeyDoris J Snyder [id number] DOB: [birth year] DOA: 06/14/2013 PCP: Allean FoundSMITH,CANDACE THIELE, MD  Assessment/Plan: 1. CVA -Small, acute left basal ganglia infarct on MRI brain -MRA unremarkable -ECHO/carotid duplex pending -LDL 104, continue crestor, now atorvastatin -Hbaic 5.8 -Neuro following  2. Dyslipidemia -continue statin  DVT  proph: lovenox  Code Status: Full Code Family Communication: no family at bedside Disposition Plan: home pending complete workup   Consultants:  Neuro  HPI/Subjective: Feels like her speech is close to baseline now  Objective: Filed Vitals:   06/15/13 1100  BP: 156/66  Pulse: 82  Temp: 98.2 F (36.8 C)  Resp: 14   No intake or output data in the 24 hours ending 06/15/13 1544 Filed Weights   06/14/13 2000  Weight: 63.64 kg (140 lb 4.8 oz)    Exam:   General:  AAOx3, no distress, no dysarthria   Cardiovascular: S1S2/RRR  Respiratory: CTAB  Abdomen: soft, Nt, BS present  Musculoskeletal: no edema c/c  Neuro: non focal   Data Reviewed: Basic Metabolic Panel:  Recent Labs Lab 06/14/13 1612 06/14/13 1631 06/15/13 0250  NA 141 143 144  K 4.3 3.9 3.9  CL 103 107 107  CO2 24  --  23  GLUCOSE 104* 105* 91  BUN 19 20 15   CREATININE 0.72 0.80 0.63  CALCIUM 9.9  --  9.2   Liver Function Tests:  Recent Labs Lab 06/14/13 1612 06/15/13 0250  AST 21 16  ALT 16 11  ALKPHOS 100 81  BILITOT 0.3 0.4  PROT 7.6 6.5  ALBUMIN 4.0 3.2*   No results found for this basename: LIPASE, AMYLASE,  in the last 168 hours No results found for this basename: AMMONIA,  in the last 168 hours CBC:  Recent Labs Lab 06/14/13 1612 06/14/13 1631 06/15/13 0250  WBC 8.1  --  6.2  NEUTROABS 5.1  --   --   HGB 14.5 14.6 12.7  HCT 42.6 43.0 37.8  MCV 88.6  --  88.5  PLT 281  --  258   Cardiac Enzymes: No results found for this basename: CKTOTAL, CKMB, CKMBINDEX, TROPONINI,  in the last 168 hours BNP  (last 3 results) No results found for this basename: PROBNP,  in the last 8760 hours CBG:  Recent Labs Lab 06/14/13 1551  GLUCAP 111*    No results found for this or any previous visit (from the past 240 hour(s)).   Studies: Dg Chest 2 View  06/15/2013   CLINICAL DATA:  History of a stroke and mild weakness  EXAM: CHEST  2 VIEW  COMPARISON:  None.  FINDINGS: The lungs are hyperinflated and clear. There is an increased AP dimension of the thorax. The cardiac silhouette is normal in size. The pulmonary vascularity is not engorged. There is no pleural effusion. The observed portions of the bony thorax exhibit no acute abnormalities.  IMPRESSION: There is hyperinflation consistent with COPD. There is no evidence of atelectasis, pneumonia, CHF, or other acute cardiopulmonary disease.   Electronically Signed   By: David  Swazil[place]   On: 06/15/2013 09:00   Ct Head Wo Contrast  06/14/2013   CLINICAL DATA:  Onset of slurred speech this morning  EXAM: CT HEAD WITHOUT CONTRAST  TECHNIQUE: Contiguous axial images were obtained from the base of the skull through the vertex without intravenous contrast.  COMPARISON:  None.  FINDINGS: The ventricles are normal in size and position. There is mild age appropriate diffuse cerebral atrophy. There is no shift  of the midline. There is no evidence of an acute intracranial hemorrhage nor of an evolving ischemic infarction. There are no abnormal intracranial calcifications. There are punctate basal ganglia calcifications bilaterally.  At bone window settings there are air-fluid levels within the maxillary and sphenoid sinuses as well as a left ethmoid sinus cell. The frontals sinuses are clear. The mastoid air cells are well pneumatized. There is no evidence of an acute skull fracture.  IMPRESSION: 1. There is mild age appropriate diffuse cerebral and cerebellar atrophy. 2. There is no evidence of an acute ischemic or hemorrhagic infarction. 3. The air-fluid levels and at  mucoperiosteal thickening within the maxillary and sphenoid and left ethmoid sinus cells is consistent with acute sinusitis.   Electronically Signed   By: David  [place]   On: 06/14/2013 16:40   Mr Maxine Glenn Head Wo Contrast  06/14/2013   CLINICAL DATA:  Slurred speech and right upper extremity weakness.  EXAM: MRI HEAD WITHOUT CONTRAST  MRA HEAD WITHOUT CONTRAST  TECHNIQUE: Multiplanar, multiecho pulse sequences of the brain and surrounding structures were obtained without intravenous contrast. Angiographic images of the head were obtained using MRA technique without contrast.  COMPARISON:  Head CT 06/14/2013  FINDINGS: MRI HEAD FINDINGS  There is a small, acute infarct involving the left lentiform nucleus and corona radiata. Linear susceptibility artifact along the superior right cerebellar folia may reflect remote hemorrhage, possibly subarachnoid. There is no other evidence of intracranial hemorrhage.  Mild age-related cerebral atrophy is noted. Small, scattered foci of T2 hyperintensity within the subcortical and deep cerebral white matter are nonspecific but compatible with mild chronic small vessel ischemic disease. There is no evidence of mass, midline shift, or extra-axial fluid collection.  Major intracranial vascular flow voids appear preserved. There are bilateral mastoid effusions. A small amount of fluid is present within both maxillary sinuses. Mild mucosal thickening is present in the maxillary sinuses, sphenoid sinuses, and ethmoid air cells bilaterally. There is slight anterolisthesis of C3 on C4 and C4 on C5.  MRA HEAD FINDINGS  The visualized distal vertebral arteries are patent with the left being dominant. Right PICA origin is patent. Left PICA is not clearly identified. The left AICA is dominant. SCAs are patent. The proximal right PCA is tortuous but otherwise unremarkable. The left P1 segment is hypoplastic with a prominent left posterior communicating artery identified. The left PCA is otherwise  unremarkable.  Internal carotid arteries are patent from skullbase to carotid termini. Small inferiorly directed outpouching from the supraclinoid right ICA measuring approximately 2 mm likely represents a P-comm infundibulum. ACAs and MCAs are unremarkable. There is a small, patent anterior communicating artery.  IMPRESSION: 1. Small, acute left basal ganglia infarct. 2. Mild chronic small vessel ischemic disease. 3. Bilateral mastoid effusions and bilateral paranasal sinus mucosal thickening and fluid as above. 4. No evidence of major intracranial arterial occlusion or flow limiting stenosis. 2 mm right P-comm infundibulum favored over small aneurysm.   Electronically Signed   By: Sebastian Ache   On: 06/14/2013 19:50   Mr Brain Wo Contrast  06/14/2013   CLINICAL DATA:  Slurred speech and right upper extremity weakness.  EXAM: MRI HEAD WITHOUT CONTRAST  MRA HEAD WITHOUT CONTRAST  TECHNIQUE: Multiplanar, multiecho pulse sequences of the brain and surrounding structures were obtained without intravenous contrast. Angiographic images of the head were obtained using MRA technique without contrast.  COMPARISON:  Head CT 06/14/2013  FINDINGS: MRI HEAD FINDINGS  There is a small, acute infarct involving the left  lentiform nucleus and corona radiata. Linear susceptibility artifact along the superior right cerebellar folia may reflect remote hemorrhage, possibly subarachnoid. There is no other evidence of intracranial hemorrhage.  Mild age-related cerebral atrophy is noted. Small, scattered foci of T2 hyperintensity within the subcortical and deep cerebral white matter are nonspecific but compatible with mild chronic small vessel ischemic disease. There is no evidence of mass, midline shift, or extra-axial fluid collection.  Major intracranial vascular flow voids appear preserved. There are bilateral mastoid effusions. A small amount of fluid is present within both maxillary sinuses. Mild mucosal thickening is present in  the maxillary sinuses, sphenoid sinuses, and ethmoid air cells bilaterally. There is slight anterolisthesis of C3 on C4 and C4 on C5.  MRA HEAD FINDINGS  The visualized distal vertebral arteries are patent with the left being dominant. Right PICA origin is patent. Left PICA is not clearly identified. The left AICA is dominant. SCAs are patent. The proximal right PCA is tortuous but otherwise unremarkable. The left P1 segment is hypoplastic with a prominent left posterior communicating artery identified. The left PCA is otherwise unremarkable.  Internal carotid arteries are patent from skullbase to carotid termini. Small inferiorly directed outpouching from the supraclinoid right ICA measuring approximately 2 mm likely represents a P-comm infundibulum. ACAs and MCAs are unremarkable. There is a small, patent anterior communicating artery.  IMPRESSION: 1. Small, acute left basal ganglia infarct. 2. Mild chronic small vessel ischemic disease. 3. Bilateral mastoid effusions and bilateral paranasal sinus mucosal thickening and fluid as above. 4. No evidence of major intracranial arterial occlusion or flow limiting stenosis. 2 mm right P-comm infundibulum favored over small aneurysm.   Electronically Signed   By: Sebastian Ache   On: 06/14/2013 19:50    Scheduled Meds: . aspirin  300 mg Rectal Daily   Or  . aspirin  325 mg Oral Daily  . atorvastatin  20 mg Oral q1800   Continuous Infusions: . sodium chloride 1,000 mL (06/14/13 2231)   Antibiotics Given (last 72 hours)   None      Principal Problem:   Slurred speech Active Problems:   TIA (transient ischemic attack)   Elevated blood pressure   Hyperlipidemia   Supratherapeutic INR   CVA (cerebral infarction)    Time spent:    [facility]  [facility] Pager [phone]. If 7PM-7AM, please contact night-coverage at [email or web address], password [facility] 06/15/2013, 3:44 PM  LOS: 1 day

## 2013-06-15 NOTE — Progress Notes (Signed)
Pt would not take Lipitor, she said that she was on it before and it was giving her muscles and stomach pain. Because of that her PCP switched her to Crestor. Md notified, Lipitor held.  Colleen Canesar Ruthetta Koopmann, RN

## 2013-06-15 NOTE — Evaluation (Signed)
Physical Therapy Evaluation Patient Details Name: Kerri Snyder MRN: [id number] DOB: 11/28/1940 Today's Date: 06/15/2013 Time: 8295-6213 PT Time Calculation (min): 18 min  PT Assessment / Plan / Recommendation History of Present Illness  73 y.o. female presenting with a complaint of slurred speech.  Neurological examination is otherwise unremarkable other than a right pronator drift.  Patient outside time window for tPA.  Head CT reviewed and shows no acute changes.  MRI of the brain reviewed as well and shows an acute left basal ganglia infarct.  MRA shows no significant stenosis.    Clinical Impression  Pt. Admitted for slurred speech.  MRI revealed L basal ganglia infarct.  Pt. At baseline for functional mobility with no further PT needs indicated.  PT to sign off.    PT Assessment  Patent does not need any further PT services    Follow Up Recommendations  No PT follow up;Supervision - Intermittent    Does the patient have the potential to tolerate intense rehabilitation      Barriers to Discharge        Equipment Recommendations  None recommended by PT    Recommendations for Other Services     Frequency      Precautions / Restrictions Precautions Precautions: Other (comment) (legally blind) Restrictions Weight Bearing Restrictions: No   Pertinent Vitals/Pain No c/o pain      Mobility  Bed Mobility Overal bed mobility: Independent General bed mobility comments: bed flat, no rails Transfers Overall transfer level: Independent Equipment used: None Ambulation/Gait Ambulation/Gait assistance: Independent (only needed cues 2/2 visual impairment) Ambulation Distance (Feet): 300 Feet Assistive device: None Gait Pattern/deviations: WFL(Within Functional Limits) Gait velocity interpretation: at or above normal speed for age/gender Modified Rankin (Stroke Patients Only) Pre-Morbid Rankin Score: No symptoms Modified Rankin: Slight disability    Exercises     PT  Diagnosis:    PT Problem List:   PT Treatment Interventions:       PT Goals(Current goals can be found in the care plan section) Acute Rehab PT Goals Patient Stated Goal: ready to go home; wants to improve speech PT Goal Formulation: No goals set, d/c therapy  Visit Information  Last PT Received On: 06/15/13 Assistance Needed: +1 History of Present Illness: 73 y.o. female presenting with a complaint of slurred speech.  Neurological examination is otherwise unremarkable other than a right pronator drift.  Patient outside time window for tPA.  Head CT reviewed and shows no acute changes.  MRI of the brain reviewed as well and shows an acute left basal ganglia infarct.  MRA shows no significant stenosis.         Prior Functioning  Home Living Family/patient expects to be discharged to:: Private residence Living Arrangements: Children Available Help at Discharge: Family;Available PRN/intermittently Type of Home: House Home Access: Stairs to enter [employer] of Steps: 1 Entrance Stairs-Rails: None Home Layout: One level Home Equipment: None Prior Function Level of Independence: Independent Comments: pt. legally blind however able to see well enough to function without assistance Communication Communication: Expressive difficulties (mild) Dominant Hand: Right    Cognition  Cognition Arousal/Alertness: Awake/alert Behavior During Therapy: WFL for tasks assessed/performed Overall Cognitive Status: Within Functional Limits for tasks assessed    Extremity/Trunk Assessment Upper Extremity Assessment Upper Extremity Assessment: Defer to OT evaluation Lower Extremity Assessment Lower Extremity Assessment: Overall WFL for tasks assessed Cervical / Trunk Assessment Cervical / Trunk Assessment: Normal   Balance Balance Overall balance assessment: Independent;No apparent balance deficits (not formally  assessed)  End of Session PT - End of Session Equipment Utilized During  Treatment: Gait belt Activity Tolerance: Patient tolerated treatment well Patient left: in chair;with call bell/phone within reach Nurse Communication: Mobility status  GP Functional Assessment Tool Used: clinical judgement Functional Limitation: Mobility: Walking and moving around Mobility: Walking and Moving Around Current Status ([id number]([id number]): 0 percent impaired, limited or restricted Mobility: Walking and Moving Around Goal Status [phone](G8979): 0 percent impaired, limited or restricted Mobility: Walking and Moving Around Discharge Status [phone](G8980): 0 percent impaired, limited or restricted   Moshe CiproMatthews, Ishmeal Rorie K 06/15/2013, 8:22 AM  Clarita CraneStephanie F Rafe Mackowski, PT, DPT [phone]

## 2013-06-16 ENCOUNTER — Inpatient Hospital Stay (HOSPITAL_COMMUNITY): Payer: Medicare Other

## 2013-06-16 DIAGNOSIS — I6529 Occlusion and stenosis of unspecified carotid artery: Secondary | ICD-10-CM

## 2013-06-16 DIAGNOSIS — I6350 Cerebral infarction due to unspecified occlusion or stenosis of unspecified cerebral artery: Principal | ICD-10-CM

## 2013-06-16 MED ORDER — ATORVASTATIN CALCIUM 10 MG PO TABS
10.0000 mg | ORAL_TABLET | Freq: Every day | ORAL | Status: DC
  Filled 2013-06-16 (×2): qty 1

## 2013-06-16 MED ORDER — IOHEXOL 350 MG/ML SOLN
50.0000 mL | Freq: Once | INTRAVENOUS | Status: AC | PRN
  Administered 2013-06-16: 50 mL via INTRAVENOUS

## 2013-06-16 NOTE — Consult Note (Signed)
[facility] NOTE   MRN : [id number]  Reason for Consult: Kerri Snyder, carotid stenosis Referring Physician: Dr. Flora Snyder  History of Present Illness: 73 y/o female presented with slurred speech 06/14/2013.  She denies other symptoms such as weakness in extremities or difficulty swallowing.   She is legal blind.  Her medical history includes hyperlipidemia. She takes crestor daily.      Current Facility-Administered Medications  Medication Dose Route Frequency Provider Last Rate Last Dose  . acetaminophen (TYLENOL) tablet 650 mg  650 mg Oral Q4H PRN Rodolph Bong, MD       Or  . acetaminophen (TYLENOL) suppository 650 mg  650 mg Rectal Q4H PRN Rodolph Bong, MD      . atorvastatin (LIPITOR) tablet 10 mg  10 mg Oral q1800 Belkys A Regalado, MD      . clopidogrel (PLAVIX) tablet 75 mg  75 mg Oral Q breakfast Layne Benton, NP   75 mg at 06/16/13 0755  . enoxaparin (LOVENOX) injection 40 mg  40 mg Subcutaneous Q24H Zannie Cove, MD   40 mg at 06/15/13 1747  . senna-docusate (Senokot-S) tablet 1 tablet  1 tablet Oral QHS PRN Rodolph Bong, MD        Pt meds include: Statin :Yes Betablocker: No ASA: Yes Other anticoagulants/antiplatelets:   Past Medical History  Diagnosis Date  . Blind   . Hyperlipidemia   . DDD (degenerative disc disease), lumbar   . Sciatica     Past Surgical History  Procedure Laterality Date  . Tubal ligation    . Cesarean section      Social History History  Substance Use Topics  . Smoking status: Never Smoker   . Smokeless tobacco: Not on file  . Alcohol Use: No    Family History Family History  Problem Relation Age of Onset  . Diabetes Father   . Heart disease Father     No Known Allergies   REVIEW OF SYSTEMS  General: [ ]  Weight loss, [ ]  Fever, [ ]  chills Neurologic: [ ]  Dizziness, [ ]  Blackouts, [ ]  Seizure [ ]  Stroke, [ ]  "Mini stroke", [x ] Slurred speech, [ ]  Temporary blindness; [ ]   weakness in arms or legs, [ ]  Hoarseness [ ]  Dysphagia [x]  leaglly blind Cardiac: [ ]  Chest pain/pressure, [ ]  Shortness of breath at rest [ ]  Shortness of breath with exertion, [ ]  Atrial fibrillation or irregular heartbeat  Vascular: [ ]  Pain in legs with walking, [ ]  Pain in legs at rest, [ ]  Pain in legs at night,  [ ]  Non-healing ulcer, [ ]  Blood clot in vein/DVT,   Pulmonary: [ ]  Home oxygen, [ ]  Productive cough, [ ]  Coughing up blood, [ ]  Asthma,  [ ]  Wheezing [ ]  COPD Musculoskeletal:  [ ]  Arthritis, [ ]  Low back pain, [ ]  Joint pain Hematologic: [ ]  Easy Bruising, [ ]  Anemia; [ ]  Hepatitis Gastrointestinal: [ ]  Blood in stool, [ ]  Gastroesophageal Reflux/heartburn, Urinary: [ ]  chronic Kidney disease, [ ]  on HD - [ ]  MWF or [ ]  TTHS, [ ]  Burning with urination, [ ]  Difficulty urinating Skin: [ ]  Rashes, [ ]  Wounds Psychological: [ ]  Anxiety, [ ]  Depression  Physical Examination Filed Vitals:   06/16/13 0004 06/16/13 0415 06/16/13 0818 06/16/13 0900  BP: 122/44 134/62 127/45 99/71  Pulse: 81 74 84   Temp: 97.8 F (36.6 C) 97.8 F (36.6 C) 97.8 F (36.6  C)   TempSrc: Oral Oral Oral   Resp: 18 18 18    Height:      Weight:      SpO2: 97% 95% 96%    Body mass index is 23.35 kg/(m^2).  General:  WDWN in NAD Gait: Normal HENT: WNL Eyes: Pupils equal Pulmonary: normal non-labored breathing , without Rales, rhonchi,  wheezing Cardiac: RRR, without  Murmurs, rubs or gallops; Positive bil. carotid bruits Abdomen: soft, NT, no masses Skin: no rashes, ulcers noted;  no Gangrene , no cellulitis; no open wounds;   Vascular Exam/Pulses:Palpable radial, brachial, femoral, DP/PT pulses intact equal bil.   Musculoskeletal: no muscle wasting or atrophy; no edema  Neurologic: A&O X 3; Appropriate Affect ;  SENSATION: normal; MOTOR FUNCTION: 5/5 Symmetric Speech is fluent/normal   Significant Diagnostic Studies: CBC Lab Results  Component Value Date   WBC 6.2 06/15/2013   HGB  12.7 06/15/2013   HCT 37.8 06/15/2013   MCV 88.5 06/15/2013   PLT 258 06/15/2013    BMET    Component Value Date/Time   NA 144 06/15/2013 0250   K 3.9 06/15/2013 0250   CL 107 06/15/2013 0250   CO2 23 06/15/2013 0250   GLUCOSE 91 06/15/2013 0250   BUN 15 06/15/2013 0250   CREATININE 0.63 06/15/2013 0250   CALCIUM 9.2 06/15/2013 0250   GFRNONAA 86* 06/15/2013 0250   GFRAA >90 06/15/2013 0250   Estimated Creatinine Clearance: 55.5 ml/min (by C-G formula based on Cr of 0.63).  COAG Lab Results  Component Value Date   INR 1.04 06/15/2013   INR 0.93 06/14/2013   INR >10.00* 06/14/2013     Non-Invasive Vascular Imaging:  CTA w/ contrast Right carotid: Calcified plaque in the right common carotid artery  without significant stenosis. Circumferential calcification  involving the proximal right internal carotid artery. There is a  critical stenosis at the upper aspect of this plaque narrowing the  lumen by approximately 90% diameter stenosis. No dissection.  Remainder of the right internal carotid artery is patent to the  skullbase. Atherosclerotic calcification in the cavernous carotid is  noted. 85% diameter stenosis of the proximal right external carotid  artery.  Left carotid: Calcified plaque along the common carotid artery  without significant stenosis. Atherosclerotic plaque in the carotid  bulb narrowing the lumen by approximately 70% diameter stenosis. 70%  diameter stenosis left external carotid artery at the origin.   ASSESSMENT/PLAN:  Carotid artery stenosis  She has narrowing in right > left carotid arteries. She will need carotid endarterectomy surgery in the future  Her stroke was on the left MRI Small, acute left basal ganglia infarct.    Kerri GallantCOLLINS, Kerri Snyder [facility] HospitalMAUREEN 06/16/2013 3:18 PM

## 2013-06-16 NOTE — Progress Notes (Signed)
    [facility] has been requested to perform a transesophageal echocardiogram on 3/5 for stroke and mitral valve abnormality on 2D echocardiogram.  After careful review of history and examination, the risks and benefits of transesophageal echocardiogram have been explained including risks of esophageal damage, perforation (1:10,000 risk), bleeding, pharyngeal hematoma as well as other potential complications associated with conscious sedation including aspiration, arrhythmia, respiratory failure and death. Alternatives to treatment were discussed, questions were answered. Patient is willing to proceed. Scheduled for 9am tomorrow with Dr. Elease HashimotoNahser.  Ronie Spiesayna Jamaurion Slemmer, PA-C 06/16/2013 5:06 PM

## 2013-06-16 NOTE — Progress Notes (Signed)
Stroke Team Progress Note  HISTORY Kerri Snyder is an 73 y.o. female who reports going to bed normal last evening. She awakened this morning at 0530 and noted slurred speech. She describes it as feeling as if her tongue would not move fast enough. She was saying her prayers and noticed that she sounded funny. Patient had breakfast and subsequently went to work. At work her supervisor also noticed her slurred speech and recommended she present to the ED. Her symptoms have improved somewhat since presentation. She does not report any weakness or numbness.  last known well: 06/13/2013 at 22:30 Patient was not administerd TPA secondary to delay in arrival. She was admitted for further evaluation and treatment.  SUBJECTIVE No family is at the bedside.  Overall she feels her condition is stable. She woke with slurred speech and did not come to the ED. Her supervisor talked her into coming to the hospital.  OBJECTIVE Most recent Vital Signs: Filed Vitals:   06/15/13 1631 06/15/13 2018 06/16/13 0004 06/16/13 0415  BP: 161/71 155/74 122/44 134/62  Pulse: 81 94 81 74  Temp: 98.3 F (36.8 C) 97.7 F (36.5 C) 97.8 F (36.6 C) 97.8 F (36.6 C)  TempSrc: Oral Oral Oral Oral  Resp: 17 20 18 18   Height:      Weight:      SpO2: 100% 96% 97% 95%   CBG (last 3)   Recent Labs  06/14/13 1551  GLUCAP 111*    IV Fluid Intake:      MEDICATIONS  . atorvastatin  20 mg Oral q1800  . clopidogrel  75 mg Oral Q breakfast  . enoxaparin (LOVENOX) injection  40 mg Subcutaneous Q24H   PRN:  acetaminophen, acetaminophen, senna-docusate  Diet:  Cardiac thin liquids Activity:  OOB with assistance DVT Prophylaxis:  Lovenox 40 mg sq daily   CLINICALLY SIGNIFICANT STUDIES Basic Metabolic Panel:   Recent Labs Lab 06/14/13 1612 06/14/13 1631 06/15/13 0250  NA 141 143 144  K 4.3 3.9 3.9  CL 103 107 107  CO2 24  --  23  GLUCOSE 104* 105* 91  BUN 19 20 15   CREATININE 0.72 0.80 0.63  CALCIUM 9.9  --   9.2   Liver Function Tests:   Recent Labs Lab 06/14/13 1612 06/15/13 0250  AST 21 16  ALT 16 11  ALKPHOS 100 81  BILITOT 0.3 0.4  PROT 7.6 6.5  ALBUMIN 4.0 3.2*   CBC:   Recent Labs Lab 06/14/13 1612 06/14/13 1631 06/15/13 0250  WBC 8.1  --  6.2  NEUTROABS 5.1  --   --   HGB 14.5 14.6 12.7  HCT 42.6 43.0 37.8  MCV 88.6  --  88.5  PLT 281  --  258   Coagulation:   Recent Labs Lab 06/14/13 1612 06/14/13 1724 06/15/13 0250  LABPROT >90.0* 12.3 13.4  INR >10.00* 0.93 1.04   Cardiac Enzymes: No results found for this basename: CKTOTAL, CKMB, CKMBINDEX, TROPONINI,  in the last 168 hours Urinalysis:   Recent Labs Lab 06/14/13 1727  COLORURINE YELLOW  LABSPEC 1.016  PHURINE 7.0  GLUCOSEU NEGATIVE  HGBUR NEGATIVE  BILIRUBINUR NEGATIVE  KETONESUR NEGATIVE  PROTEINUR NEGATIVE  UROBILINOGEN 0.2  NITRITE NEGATIVE  LEUKOCYTESUR NEGATIVE   Lipid Panel    Component Value Date/Time   CHOL 176 06/15/2013 0250   TRIG 64 06/15/2013 0250   HDL 59 06/15/2013 0250   CHOLHDL 3.0 06/15/2013 0250   VLDL 13 06/15/2013 0250   LDLCALC  104* 06/15/2013 0250   HgbA1C  Lab Results  Component Value Date   HGBA1C 5.8* 06/15/2013    Urine Drug Screen:     Component Value Date/Time   LABOPIA NONE DETECTED 06/14/2013 1727   COCAINSCRNUR NONE DETECTED 06/14/2013 1727   LABBENZ NONE DETECTED 06/14/2013 1727   AMPHETMU NONE DETECTED 06/14/2013 1727   THCU NONE DETECTED 06/14/2013 1727   LABBARB NONE DETECTED 06/14/2013 1727    Alcohol Level:   Recent Labs Lab 06/14/13 1612  ETH <11    CT of the brain  06/14/2013   1. There is mild age appropriate diffuse cerebral and cerebellar atrophy. 2. There is no evidence of an acute ischemic or hemorrhagic infarction. 3. The air-fluid levels and at mucoperiosteal thickening within the maxillary and sphenoid and left ethmoid sinus cells is consistent with acute sinusitis.     MRI of the brain  06/14/2013   1. Small, acute left basal ganglia infarct. 2.  Mild chronic small vessel ischemic disease. 3. Bilateral mastoid effusions and bilateral paranasal sinus mucosal thickening and fluid   MRA of the brain  06/14/2013   No evidence of major intracranial arterial occlusion or flow limiting stenosis. 2 mm right P-comm infundibulum favored over small aneurysm.     2D Echocardiogram  EF 60-65% with no source of embolus. . There is a focal rounded density on the tip of the  \anterior mitral valve leaflet that most likely represents focal calcification but cannot rule out vegetation by this study.  Carotid Doppler  Right: 40-59% internal carotid artery stenosis. Left: 60-79% internal carotid artery stenosis. Bilateral vertebral artery flow is antegrade.  CXR  06/15/2013   There is hyperinflation consistent with COPD. There is no evidence of atelectasis, pneumonia, CHF, or other acute cardiopulmonary disease.     EKG  normal sinus rhythm. For complete results please see formal report.   Therapy Recommendations no PT or OT  Physical Exam   Awake alert. Afebrile. Head is nontraumatic. Neck is supple without bruit. Hearing is normal. Cardiac exam no murmur or gallop. Lungs are clear to auscultation. Distal pulses are well felt. Neurological Exam ;  Awake  Alert oriented x 3. Normal speech and language.eye movements full without nystagmus.fundi were not visualized. Vision acuity and fields appear normal. Hearing is normal. Palatal movements are normal. Face symmetric. Tongue midline. Normal strength, tone, reflexes and coordination. Normal sensation. Gait deferred.  ASSESSMENT [name] is a 73 y.o. female presenting with slurred speech.  Imaging confirms a small left basal ganglia infarct. Given location, infarct most likely thrombotic secondary to small vessel disease. Do not feel abnormal finding on 2D echo is associated with current stroke.  On aspirin 81 mg x 2 po daily prior to admission. Now on aspirin 325 mg orally every day for secondary stroke  prevention. Patient with resultant slurred speech resolved. Work up underway.  Hyperlipidemia, LDL 104, on crestor PTA, now on crestor, goal LDL < 100   Legally blind 40/98120/200  [place]  ICA stenosis: Right: 40-59%, Left: 60-79%   Mitral valve with focal rounded density on the tip of the anterior leaflet, likely calcification  Hospital day # 2  TREATMENT/PLAN  Change aspirin to clopidogrel 75 mg orally every day for secondary stroke prevention.  Last ate at 8a.  CT angio neck to look at left ICA stenosis to eval for need for possible OR TEE to evaluate mitral valve abnormality. Arranged with [facility] for tomorrow.  If  positive for PFO (patent foramen ovale), check bilateral lower extremity venous dopplers to rule out DVT as possible source of stroke. (I have made patient NPO after midnight tonight).  Discussed with pt.  Annie Main, MSN, RN, ANVP-BC, ANP-BC, [facility] Pager: [phone] 06/16/2013 8:17 AM  I have personally obtained a history, examined the patient, evaluated imaging results, and formulated the assessment and plan of care. I agree with the above. Delia Heady, MD

## 2013-06-16 NOTE — Progress Notes (Signed)
[facility] PROGRESS NOTE  Kerri Snyder ZOX:[id number] DOB: 02/10/1941 DOA: 06/14/2013 PCP: Allean Found, MD  Assessment/Plan: 1. CVA -Small, acute left basal ganglia infarct on MRI brain -MRA unremarkable -ECHO; There is a focal rounded density on the tip of the anterior mitral valve leaflet that most likely represents focal calcification but cannot rule out vegetation by this study. -carotid duplex: Right: 40-59% internal carotid artery stenosis. Left: 60-79% internal carotid artery stenosis. Bilateral vertebral artery flow is antegrade. -Ct Neck ordered. Vascular consulted.  -LDL 104, continue crestor, now atorvastatin -Hbaic 5.8 -Neuro following -Plan for TEE tomorrow.  -on plavix for secondary stroke prevention.   2. Dyslipidemia -continue statin  DVT  proph: lovenox  Code Status: Full Code Family Communication: no family at bedside Disposition Plan: home pending complete workup   Consultants:  Neuro  HPI/Subjective: Speech better. No complaints.   Objective: Filed Vitals:   06/16/13 0900  BP: 99/71  Pulse:   Temp:   Resp:    No intake or output data in the 24 hours ending 06/16/13 1441 Filed Weights   06/14/13 2000  Weight: 63.64 kg (140 lb 4.8 oz)    Exam:   General:  AAOx3, no distress, no dysarthria   Cardiovascular: S1S2/RRR  Respiratory: CTAB  Abdomen: soft, Nt, BS present  Musculoskeletal: no edema c/c  Neuro: non focal   Data Reviewed: Basic Metabolic Panel:  Recent Labs Lab 06/14/13 1612 06/14/13 1631 06/15/13 0250  NA 141 143 144  K 4.3 3.9 3.9  CL 103 107 107  CO2 24  --  23  GLUCOSE 104* 105* 91  BUN 19 20 15   CREATININE 0.72 0.80 0.63  CALCIUM 9.9  --  9.2   Liver Function Tests:  Recent Labs Lab 06/14/13 1612 06/15/13 0250  AST 21 16  ALT 16 11  ALKPHOS 100 81  BILITOT 0.3 0.4  PROT 7.6 6.5  ALBUMIN 4.0 3.2*   No results found for this basename: LIPASE, AMYLASE,  in the last 168 hours No  results found for this basename: AMMONIA,  in the last 168 hours CBC:  Recent Labs Lab 06/14/13 1612 06/14/13 1631 06/15/13 0250  WBC 8.1  --  6.2  NEUTROABS 5.1  --   --   HGB 14.5 14.6 12.7  HCT 42.6 43.0 37.8  MCV 88.6  --  88.5  PLT 281  --  258   Cardiac Enzymes: No results found for this basename: CKTOTAL, CKMB, CKMBINDEX, TROPONINI,  in the last 168 hours BNP (last 3 results) No results found for this basename: PROBNP,  in the last 8760 hours CBG:  Recent Labs Lab 06/14/13 1551  GLUCAP 111*    No results found for this or any previous visit (from the past 240 hour(s)).   Studies: Dg Chest 2 View  06/15/2013   CLINICAL DATA:  History of a stroke and mild weakness  EXAM: CHEST  2 VIEW  COMPARISON:  None.  FINDINGS: The lungs are hyperinflated and clear. There is an increased AP dimension of the thorax. The cardiac silhouette is normal in size. The pulmonary vascularity is not engorged. There is no pleural effusion. The observed portions of the bony thorax exhibit no acute abnormalities.  IMPRESSION: There is hyperinflation consistent with COPD. There is no evidence of atelectasis, pneumonia, CHF, or other acute cardiopulmonary disease.   Electronically Signed   By: David  [place]   On: 06/15/2013 09:00   Ct Head Wo Contrast  06/14/2013   CLINICAL DATA:  Onset of slurred speech this morning  EXAM: CT HEAD WITHOUT CONTRAST  TECHNIQUE: Contiguous axial images were obtained from the base of the skull through the vertex without intravenous contrast.  COMPARISON:  None.  FINDINGS: The ventricles are normal in size and position. There is mild age appropriate diffuse cerebral atrophy. There is no shift of the midline. There is no evidence of an acute intracranial hemorrhage nor of an evolving ischemic infarction. There are no abnormal intracranial calcifications. There are punctate basal ganglia calcifications bilaterally.  At bone window settings there are air-fluid levels within the  maxillary and sphenoid sinuses as well as a left ethmoid sinus cell. The frontals sinuses are clear. The mastoid air cells are well pneumatized. There is no evidence of an acute skull fracture.  IMPRESSION: 1. There is mild age appropriate diffuse cerebral and cerebellar atrophy. 2. There is no evidence of an acute ischemic or hemorrhagic infarction. 3. The air-fluid levels and at mucoperiosteal thickening within the maxillary and sphenoid and left ethmoid sinus cells is consistent with acute sinusitis.   Electronically Signed   By: David  [place]   On: 06/14/2013 16:40   Ct Angio Neck W/cm &/or Wo/cm  06/16/2013   CLINICAL DATA:  Stroke.  Carotid stenosis  EXAM: CT ANGIOGRAPHY NECK  TECHNIQUE: Multidetector CT imaging of the neck was performed using the standard protocol during bolus administration of intravenous contrast. Multiplanar CT image reconstructions and MIPs were obtained to evaluate the vascular anatomy. Carotid stenosis measurements (when applicable) are obtained utilizing NASCET criteria, using the distal internal carotid diameter as the denominator.  CONTRAST:  50mL OMNIPAQUE IOHEXOL 350 MG/ML SOLN  COMPARISON:  MRI head 06/14/2013  FINDINGS: Apical lung scarring bilaterally involving the pleura and parenchyma. Negative for mass or adenopathy in the neck. Cervical spondylosis and facet degeneration. 2 mm anterior slip C4-5 appears degenerative.  Standard 3 vessel aortic arch with mild atherosclerotic disease. Proximal great vessels show mild atherosclerotic change without significant stenosis.  Right carotid: Calcified plaque in the right common carotid artery without significant stenosis. Circumferential calcification involving the proximal right internal carotid artery. There is a critical stenosis at the upper aspect of this plaque narrowing the lumen by approximately 90% diameter stenosis. No dissection. Remainder of the right internal carotid artery is patent to the skullbase. Atherosclerotic  calcification in the cavernous carotid is noted. 85% diameter stenosis of the proximal right external carotid artery.  Left carotid: Calcified plaque along the common carotid artery without significant stenosis. Atherosclerotic plaque in the carotid bulb narrowing the lumen by approximately 70% diameter stenosis. 70% diameter stenosis left external carotid artery at the origin.  Both vertebral arteries are patent to the basilar without stenosis.  Air-fluid levels are noted in the maxillary sinus bilaterally. These were also present on 06/14/2013  Review of the MIP images confirms the above findings.  IMPRESSION: Critical stenosis of the proximal right internal carotid artery. Circumferential calcification is present within 90% stenosis at the distal aspect of the plaque in the internal carotid artery. There is also an 85% diameter stenosis of the proximal right external carotid artery.  70% diameter stenosis of the proximal left internal carotid artery and left external carotid artery.  Both vertebral arteries are patent without significant stenosis.   Electronically Signed   By: Marlan Palau M.D.   On: 06/16/2013 10:44   Mr Maxine Glenn Head Wo Contrast  06/14/2013   CLINICAL DATA:  Slurred speech and right upper extremity weakness.  EXAM: MRI HEAD WITHOUT CONTRAST  MRA HEAD WITHOUT CONTRAST  TECHNIQUE: Multiplanar, multiecho pulse sequences of the brain and surrounding structures were obtained without intravenous contrast. Angiographic images of the head were obtained using MRA technique without contrast.  COMPARISON:  Head CT 06/14/2013  FINDINGS: MRI HEAD FINDINGS  There is a small, acute infarct involving the left lentiform nucleus and corona radiata. Linear susceptibility artifact along the superior right cerebellar folia may reflect remote hemorrhage, possibly subarachnoid. There is no other evidence of intracranial hemorrhage.  Mild age-related cerebral atrophy is noted. Small, scattered foci of T2 hyperintensity  within the subcortical and deep cerebral white matter are nonspecific but compatible with mild chronic small vessel ischemic disease. There is no evidence of mass, midline shift, or extra-axial fluid collection.  Major intracranial vascular flow voids appear preserved. There are bilateral mastoid effusions. A small amount of fluid is present within both maxillary sinuses. Mild mucosal thickening is present in the maxillary sinuses, sphenoid sinuses, and ethmoid air cells bilaterally. There is slight anterolisthesis of C3 on C4 and C4 on C5.  MRA HEAD FINDINGS  The visualized distal vertebral arteries are patent with the left being dominant. Right PICA origin is patent. Left PICA is not clearly identified. The left AICA is dominant. SCAs are patent. The proximal right PCA is tortuous but otherwise unremarkable. The left P1 segment is hypoplastic with a prominent left posterior communicating artery identified. The left PCA is otherwise unremarkable.  Internal carotid arteries are patent from skullbase to carotid termini. Small inferiorly directed outpouching from the supraclinoid right ICA measuring approximately 2 mm likely represents a P-comm infundibulum. ACAs and MCAs are unremarkable. There is a small, patent anterior communicating artery.  IMPRESSION: 1. Small, acute left basal ganglia infarct. 2. Mild chronic small vessel ischemic disease. 3. Bilateral mastoid effusions and bilateral paranasal sinus mucosal thickening and fluid as above. 4. No evidence of major intracranial arterial occlusion or flow limiting stenosis. 2 mm right P-comm infundibulum favored over small aneurysm.   Electronically Signed   By: Sebastian Ache   On: 06/14/2013 19:50   Mr Brain Wo Contrast  06/14/2013   CLINICAL DATA:  Slurred speech and right upper extremity weakness.  EXAM: MRI HEAD WITHOUT CONTRAST  MRA HEAD WITHOUT CONTRAST  TECHNIQUE: Multiplanar, multiecho pulse sequences of the brain and surrounding structures were obtained  without intravenous contrast. Angiographic images of the head were obtained using MRA technique without contrast.  COMPARISON:  Head CT 06/14/2013  FINDINGS: MRI HEAD FINDINGS  There is a small, acute infarct involving the left lentiform nucleus and corona radiata. Linear susceptibility artifact along the superior right cerebellar folia may reflect remote hemorrhage, possibly subarachnoid. There is no other evidence of intracranial hemorrhage.  Mild age-related cerebral atrophy is noted. Small, scattered foci of T2 hyperintensity within the subcortical and deep cerebral white matter are nonspecific but compatible with mild chronic small vessel ischemic disease. There is no evidence of mass, midline shift, or extra-axial fluid collection.  Major intracranial vascular flow voids appear preserved. There are bilateral mastoid effusions. A small amount of fluid is present within both maxillary sinuses. Mild mucosal thickening is present in the maxillary sinuses, sphenoid sinuses, and ethmoid air cells bilaterally. There is slight anterolisthesis of C3 on C4 and C4 on C5.  MRA HEAD FINDINGS  The visualized distal vertebral arteries are patent with the left being dominant. Right PICA origin is patent. Left PICA is not clearly identified. The left AICA is dominant. SCAs are patent. The proximal right PCA is tortuous  but otherwise unremarkable. The left P1 segment is hypoplastic with a prominent left posterior communicating artery identified. The left PCA is otherwise unremarkable.  Internal carotid arteries are patent from skullbase to carotid termini. Small inferiorly directed outpouching from the supraclinoid right ICA measuring approximately 2 mm likely represents a P-comm infundibulum. ACAs and MCAs are unremarkable. There is a small, patent anterior communicating artery.  IMPRESSION: 1. Small, acute left basal ganglia infarct. 2. Mild chronic small vessel ischemic disease. 3. Bilateral mastoid effusions and bilateral  paranasal sinus mucosal thickening and fluid as above. 4. No evidence of major intracranial arterial occlusion or flow limiting stenosis. 2 mm right P-comm infundibulum favored over small aneurysm.   Electronically Signed   By: Sebastian AcheAllen  Grady   On: 06/14/2013 19:50    Scheduled Meds: . atorvastatin  20 mg Oral q1800  . clopidogrel  75 mg Oral Q breakfast  . enoxaparin (LOVENOX) injection  40 mg Subcutaneous Q24H   Continuous Infusions:   Antibiotics Given (last 72 hours)   None      Principal Problem:   Slurred speech Active Problems:   TIA (transient ischemic attack)   Elevated blood pressure   Hyperlipidemia   Supratherapeutic INR   CVA (cerebral infarction)    Time spent: 25min    Jeziah Kretschmer  [facility] Pager [phone]. If 7PM-7AM, please contact night-coverage at [email or web address], password [email or web address] 06/16/2013, 2:41 PM  LOS: 2 days

## 2013-06-16 NOTE — Consult Note (Signed)
Very pleasant 73 year old admitted with an episode of a fascia. Patient specifically denies any prior neurologic deficits. No prior achalasia events or weakness. She is legally blind lifelong. She reports that her speech is near baseline.  Extensive workup reviewed. This did reveal bilateral moderate-to-severe carotid stenoses by duplex. Subsequent CT angiogram reveals greater than 70% left carotid stenosis which is calcified and irregular and greater than 90% right carotid stenosis which is calcified and very irregular.  MR shows a acute left basal ganglion stroke.  TEE is pending.  Impression and plan: Symptomatic left carotid stenosis and asymptomatic 90% right internal carotid artery stenosis. I have recommended staged bilateral endarterectomies. Discuss this at length with the patient. I recommended left endarterectomy first since this is her symptomatic side and she is left brain dominant. Will await results of TEE. If TEE is normal, would recommend discharge to home and plan readmission next week for left carotid endarterectomy. Will follow with you.

## 2013-06-17 ENCOUNTER — Encounter (HOSPITAL_COMMUNITY): Payer: Self-pay | Admitting: *Deleted

## 2013-06-17 ENCOUNTER — Encounter (HOSPITAL_COMMUNITY)
Admission: EM | Disposition: A | Discharge status: Home / Self Care | Payer: Self-pay | Source: Home / Self Care | Attending: Internal Medicine

## 2013-06-17 DIAGNOSIS — I6789 Other cerebrovascular disease: Secondary | ICD-10-CM

## 2013-06-17 DIAGNOSIS — I6350 Cerebral infarction due to unspecified occlusion or stenosis of unspecified cerebral artery: Secondary | ICD-10-CM

## 2013-06-17 HISTORY — PX: TEE WITHOUT CARDIOVERSION: SHX5443

## 2013-06-17 SURGERY — ECHOCARDIOGRAM, TRANSESOPHAGEAL
Anesthesia: Moderate Sedation

## 2013-06-17 MED ORDER — FENTANYL CITRATE 0.05 MG/ML IJ SOLN
INTRAMUSCULAR | Status: AC
  Filled 2013-06-17: qty 2

## 2013-06-17 MED ORDER — CLOPIDOGREL BISULFATE 75 MG PO TABS
75.0000 mg | ORAL_TABLET | Freq: Every day | ORAL | Status: AC
Start: 2013-06-17 — End: ?

## 2013-06-17 MED ORDER — BUTAMBEN-TETRACAINE-BENZOCAINE 2-2-14 % EX AERO
INHALATION_SPRAY | CUTANEOUS | Status: DC | PRN
  Administered 2013-06-17: 2 via TOPICAL

## 2013-06-17 MED ORDER — SODIUM CHLORIDE 0.9 % IV SOLN
INTRAVENOUS | Status: DC
  Administered 2013-06-17: 500 mL via INTRAVENOUS

## 2013-06-17 MED ORDER — MIDAZOLAM HCL 5 MG/ML IJ SOLN
INTRAMUSCULAR | Status: AC
  Filled 2013-06-17: qty 2

## 2013-06-17 MED ORDER — MIDAZOLAM HCL 10 MG/2ML IJ SOLN
INTRAMUSCULAR | Status: DC | PRN
  Administered 2013-06-17: 2 mg via INTRAVENOUS
  Administered 2013-06-17: 1 mg via INTRAVENOUS

## 2013-06-17 MED ORDER — FENTANYL CITRATE 0.05 MG/ML IJ SOLN
INTRAMUSCULAR | Status: DC | PRN
  Administered 2013-06-17 (×2): 25 ug via INTRAVENOUS

## 2013-06-17 NOTE — Interval H&P Note (Signed)
History and Physical Interval Note:  06/17/2013 8:55 AM  Lynnette Caffeyoris J Fleming  has presented today for surgery, with the diagnosis of stroke  The various methods of treatment have been discussed with the patient and family. After consideration of risks, benefits and other options for treatment, the patient has consented to  Procedure(s): TRANSESOPHAGEAL ECHOCARDIOGRAM (TEE) (N/A) as a surgical intervention .  The patient's history has been reviewed, patient examined, no change in status, stable for surgery.  I have reviewed the patient's chart and labs.  Questions were answered to the patient's satisfaction.     Elyn AquasPhilip Tiajah Oyster Jr.

## 2013-06-17 NOTE — CV Procedure (Signed)
    Transesophageal Echocardiogram Note  Lynnette CaffeyDoris J Delatte [id number] Jul 25, 1940  Procedure: Transesophageal Echocardiogram Indications: CVA  Procedure Details Consent: Obtained Time Out: Verified patient identification, verified procedure, site/side was marked, verified correct patient position, special equipment/implants available, Radiology Safety Procedures followed,  medications/allergies/relevent history reviewed, required imaging and test results available.  Performed  Medications: Fentanyl: 50 mcg iv Versed: 3 mg iv  Left Ventrical:  Normal LV function  Mitral Valve: normal MV, no MR  Aortic Valve: normal AV  Tricuspid Valve: normal TV  Pulmonic Valve: normal   Left Atrium/ Left atrial appendage: no thrombus  Atrial septum: + PFO by bubble contrast  Aorta: mild calcification,    Complications: No apparent complications Patient did tolerate procedure well.   Vesta MixerPhilip J. Ricki Clack, Montez HagemanJr., MD, [facility] CenterFACC 06/17/2013, 9:13 AM

## 2013-06-17 NOTE — Progress Notes (Signed)
Patient ID: Kerri Snyder, female   DOB: 12/30/1940, 73 y.o.   MRN: [id number] Comfortable this morning. It feels that her speech is back to baseline. No new neurologic deficits.  4 TEE today to rule out cardiac source for embolus.  Assuming no new difficulty demonstrated from cardiac standpoint, we'll plan staged bilateral carotid endarterectomies with left carotid endarterectomy next week and right endarterectomy when she is fully recovered. Okay for discharge to home at any point. We will schedule readmission next week for surgery

## 2013-06-17 NOTE — Discharge Summary (Signed)
Physician Discharge Summary  PERL FOLMAR ZOX:[id number] DOB: Jul 06, 1940 DOA: 06/14/2013  PCP: Allean Found, MD  Admit date: 06/14/2013 Discharge date: 06/17/2013  Time spent: 35  minutes  Recommendations for Outpatient Follow-up:  1. Follow up with Dr Early for carotid endarterectomy.  2. Follow up with Dr Pearlean Brownie for further care stroke.   Discharge Diagnoses:   CVA (cerebral infarction)   Elevated blood pressure   Hyperlipidemia   Supratherapeutic INR      Discharge Condition: Stable.   Diet recommendation: Heart Healthy  Filed Weights   06/14/13 2000  Weight: 63.64 kg (140 lb 4.8 oz)    History of present illness:  With history of legal blindness, hyperlipidemia, degenerative disc disease who presents to the ED with slurred speech. The patient stated she went to bed around 10:30 PM the night prior to admission and awoke up around 5 AM on the morning of admission. Patient stated that was saying her prayers she noticed she had slurred speech and sounded funny. Patient stated she breakfast subsequently went to work and was at work the supervisor also noticed a slurred speech and recommended she present to the ED. Patient denies any asymmetric weakness. No facial droop. No weakness. No other associated symptoms. Patient denies any fevers, no chills, no chest pain, no shortness of breath, no nausea, no vomiting, no abdominal pain, no diarrhea, no constipation, no dysuria, no weakness, no cough. Patient states he takes a baby aspirin intermittently. Patient states has had some improvement with speech. Patient was seen in the ED secondary to delay in presentation was deemed not a TPA candidate. CT of the head which was obtained was negative for any acute abnormalities. Compressive metabolic profile obtained was unremarkable. CBC obtained was unremarkable. INR obtained was greater than 10. APTT was greater than 200. Urinalysis was negative. We were called to admit the patient for further  evaluation and management.   Hospital Course:  1-CVA -Small, acute left basal ganglia infarct on MRI brain  -MRA unremarkable  -ECHO; There is a focal rounded density on the tip of the anterior mitral valve leaflet that most likely represents focal calcification but cannot rule out vegetation by this study.  -carotid duplex: Right: 40-59% internal carotid artery stenosis. Left: 60-79% internal carotid artery stenosis. Bilateral vertebral artery flow is antegrade.  -Ct Neck confirm carotid stenosis, bilateral. Vascular consulted.  -LDL 104, continue crestor, now atorvastatin  -Hbaic 5.8  -Neuro following -TEE negative for source of embolism. Positive for PFO. Doppler LE negative for DVT.  -on plavix for secondary stroke prevention.  2. Dyslipidemia  -continue statin  Procedures:    Consultations:  Neurology  Vascular  Discharge Exam: Filed Vitals:   06/17/13 0950  BP: 156/68  Pulse: 84  Temp:   Resp: 18    General: No distress.  Cardiovascular: S 1. S 2 RRR Respiratory: CTA  Discharge Instructions  Discharge Orders   Future Orders Complete By Expires   Diet - low sodium heart healthy  As directed    Increase activity slowly  As directed        Medication List    STOP taking these medications       aspirin 81 MG tablet     pseudoephedrine 30 MG tablet  Commonly known as:  SUDAFED      TAKE these medications       B-12 PO  Take 1 tablet by mouth every morning.     clopidogrel 75 MG tablet  Commonly known as:  PLAVIX  Take 1 tablet (75 mg total) by mouth daily with breakfast.     rosuvastatin 10 MG tablet  Commonly known as:  CRESTOR  Take 10 mg by mouth at bedtime.     VITAMIN C PO  Take 1 tablet by mouth every morning.     VITAMIN D PO  Take 1 tablet by mouth every morning.       No Known Allergies     Follow-up Information   Follow up with EARLY, TODD, MD In 1 week.   Specialty:  Vascular Surgery   Contact information:   [address] [place] [address] [phone]       Follow up with Gates Rigg, MD In 2 months.   Specialties:  Neurology, Radiology   Contact information:   [address] [place] [address] [phone]        The results of significant diagnostics from this hospitalization (including imaging, microbiology, ancillary and laboratory) are listed below for reference.    Significant Diagnostic Studies: Dg Chest 2 View  06/15/2013   CLINICAL DATA:  History of a stroke and mild weakness  EXAM: CHEST  2 VIEW  COMPARISON:  None.  FINDINGS: The lungs are hyperinflated and clear. There is an increased AP dimension of the thorax. The cardiac silhouette is normal in size. The pulmonary vascularity is not engorged. There is no pleural effusion. The observed portions of the bony thorax exhibit no acute abnormalities.  IMPRESSION: There is hyperinflation consistent with COPD. There is no evidence of atelectasis, pneumonia, CHF, or other acute cardiopulmonary disease.   Electronically Signed   By: David  [place]   On: 06/15/2013 09:00   Ct Head Wo Contrast  06/14/2013   CLINICAL DATA:  Onset of slurred speech this morning  EXAM: CT HEAD WITHOUT CONTRAST  TECHNIQUE: Contiguous axial images were obtained from the base of the skull through the vertex without intravenous contrast.  COMPARISON:  None.  FINDINGS: The ventricles are normal in size and position. There is mild age appropriate diffuse cerebral atrophy. There is no shift of the midline. There is no evidence of an acute intracranial hemorrhage nor of an evolving ischemic infarction. There are no abnormal intracranial calcifications. There are punctate basal ganglia calcifications bilaterally.  At bone window settings there are air-fluid levels within the maxillary and sphenoid sinuses as well as a left ethmoid sinus cell. The frontals sinuses are clear. The mastoid air cells are well pneumatized. There is no evidence of an acute skull  fracture.  IMPRESSION: 1. There is mild age appropriate diffuse cerebral and cerebellar atrophy. 2. There is no evidence of an acute ischemic or hemorrhagic infarction. 3. The air-fluid levels and at mucoperiosteal thickening within the maxillary and sphenoid and left ethmoid sinus cells is consistent with acute sinusitis.   Electronically Signed   By: David  [place]   On: 06/14/2013 16:40   Ct Angio Neck W/cm &/or Wo/cm  06/16/2013   CLINICAL DATA:  Stroke.  Carotid stenosis  EXAM: CT ANGIOGRAPHY NECK  TECHNIQUE: Multidetector CT imaging of the neck was performed using the standard protocol during bolus administration of intravenous contrast. Multiplanar CT image reconstructions and MIPs were obtained to evaluate the vascular anatomy. Carotid stenosis measurements (when applicable) are obtained utilizing NASCET criteria, using the distal internal carotid diameter as the denominator.  CONTRAST:  50mL OMNIPAQUE IOHEXOL 350 MG/ML SOLN  COMPARISON:  MRI head 06/14/2013  FINDINGS: Apical lung scarring bilaterally involving the pleura and parenchyma.  Negative for mass or adenopathy in the neck. Cervical spondylosis and facet degeneration. 2 mm anterior slip C4-5 appears degenerative.  Standard 3 vessel aortic arch with mild atherosclerotic disease. Proximal great vessels show mild atherosclerotic change without significant stenosis.  Right carotid: Calcified plaque in the right common carotid artery without significant stenosis. Circumferential calcification involving the proximal right internal carotid artery. There is a critical stenosis at the upper aspect of this plaque narrowing the lumen by approximately 90% diameter stenosis. No dissection. Remainder of the right internal carotid artery is patent to the skullbase. Atherosclerotic calcification in the cavernous carotid is noted. 85% diameter stenosis of the proximal right external carotid artery.  Left carotid: Calcified plaque along the common carotid artery  without significant stenosis. Atherosclerotic plaque in the carotid bulb narrowing the lumen by approximately 70% diameter stenosis. 70% diameter stenosis left external carotid artery at the origin.  Both vertebral arteries are patent to the basilar without stenosis.  Air-fluid levels are noted in the maxillary sinus bilaterally. These were also present on 06/14/2013  Review of the MIP images confirms the above findings.  IMPRESSION: Critical stenosis of the proximal right internal carotid artery. Circumferential calcification is present within 90% stenosis at the distal aspect of the plaque in the internal carotid artery. There is also an 85% diameter stenosis of the proximal right external carotid artery.  70% diameter stenosis of the proximal left internal carotid artery and left external carotid artery.  Both vertebral arteries are patent without significant stenosis.   Electronically Signed   By: Marlan Palauharles  Clark M.D.   On: 06/16/2013 10:44   Mr Maxine GlennMra Head Wo Contrast  06/14/2013   CLINICAL DATA:  Slurred speech and right upper extremity weakness.  EXAM: MRI HEAD WITHOUT CONTRAST  MRA HEAD WITHOUT CONTRAST  TECHNIQUE: Multiplanar, multiecho pulse sequences of the brain and surrounding structures were obtained without intravenous contrast. Angiographic images of the head were obtained using MRA technique without contrast.  COMPARISON:  Head CT 06/14/2013  FINDINGS: MRI HEAD FINDINGS  There is a small, acute infarct involving the left lentiform nucleus and corona radiata. Linear susceptibility artifact along the superior right cerebellar folia may reflect remote hemorrhage, possibly subarachnoid. There is no other evidence of intracranial hemorrhage.  Mild age-related cerebral atrophy is noted. Small, scattered foci of T2 hyperintensity within the subcortical and deep cerebral white matter are nonspecific but compatible with mild chronic small vessel ischemic disease. There is no evidence of mass, midline shift, or  extra-axial fluid collection.  Major intracranial vascular flow voids appear preserved. There are bilateral mastoid effusions. A small amount of fluid is present within both maxillary sinuses. Mild mucosal thickening is present in the maxillary sinuses, sphenoid sinuses, and ethmoid air cells bilaterally. There is slight anterolisthesis of C3 on C4 and C4 on C5.  MRA HEAD FINDINGS  The visualized distal vertebral arteries are patent with the left being dominant. Right PICA origin is patent. Left PICA is not clearly identified. The left AICA is dominant. SCAs are patent. The proximal right PCA is tortuous but otherwise unremarkable. The left P1 segment is hypoplastic with a prominent left posterior communicating artery identified. The left PCA is otherwise unremarkable.  Internal carotid arteries are patent from skullbase to carotid termini. Small inferiorly directed outpouching from the supraclinoid right ICA measuring approximately 2 mm likely represents a P-comm infundibulum. ACAs and MCAs are unremarkable. There is a small, patent anterior communicating artery.  IMPRESSION: 1. Small, acute left basal ganglia infarct. 2. Mild  chronic small vessel ischemic disease. 3. Bilateral mastoid effusions and bilateral paranasal sinus mucosal thickening and fluid as above. 4. No evidence of major intracranial arterial occlusion or flow limiting stenosis. 2 mm right P-comm infundibulum favored over small aneurysm.   Electronically Signed   By: Sebastian Ache   On: 06/14/2013 19:50   Mr Brain Wo Contrast  06/14/2013   CLINICAL DATA:  Slurred speech and right upper extremity weakness.  EXAM: MRI HEAD WITHOUT CONTRAST  MRA HEAD WITHOUT CONTRAST  TECHNIQUE: Multiplanar, multiecho pulse sequences of the brain and surrounding structures were obtained without intravenous contrast. Angiographic images of the head were obtained using MRA technique without contrast.  COMPARISON:  Head CT 06/14/2013  FINDINGS: MRI HEAD FINDINGS  There  is a small, acute infarct involving the left lentiform nucleus and corona radiata. Linear susceptibility artifact along the superior right cerebellar folia may reflect remote hemorrhage, possibly subarachnoid. There is no other evidence of intracranial hemorrhage.  Mild age-related cerebral atrophy is noted. Small, scattered foci of T2 hyperintensity within the subcortical and deep cerebral white matter are nonspecific but compatible with mild chronic small vessel ischemic disease. There is no evidence of mass, midline shift, or extra-axial fluid collection.  Major intracranial vascular flow voids appear preserved. There are bilateral mastoid effusions. A small amount of fluid is present within both maxillary sinuses. Mild mucosal thickening is present in the maxillary sinuses, sphenoid sinuses, and ethmoid air cells bilaterally. There is slight anterolisthesis of C3 on C4 and C4 on C5.  MRA HEAD FINDINGS  The visualized distal vertebral arteries are patent with the left being dominant. Right PICA origin is patent. Left PICA is not clearly identified. The left AICA is dominant. SCAs are patent. The proximal right PCA is tortuous but otherwise unremarkable. The left P1 segment is hypoplastic with a prominent left posterior communicating artery identified. The left PCA is otherwise unremarkable.  Internal carotid arteries are patent from skullbase to carotid termini. Small inferiorly directed outpouching from the supraclinoid right ICA measuring approximately 2 mm likely represents a P-comm infundibulum. ACAs and MCAs are unremarkable. There is a small, patent anterior communicating artery.  IMPRESSION: 1. Small, acute left basal ganglia infarct. 2. Mild chronic small vessel ischemic disease. 3. Bilateral mastoid effusions and bilateral paranasal sinus mucosal thickening and fluid as above. 4. No evidence of major intracranial arterial occlusion or flow limiting stenosis. 2 mm right P-comm infundibulum favored over  small aneurysm.   Electronically Signed   By: Sebastian Ache   On: 06/14/2013 19:50    Microbiology: No results found for this or any previous visit (from the past 240 hour(s)).   Labs: Basic Metabolic Panel:  Recent Labs Lab 06/14/13 1612 06/14/13 1631 06/15/13 0250  NA 141 143 144  K 4.3 3.9 3.9  CL 103 107 107  CO2 24  --  23  GLUCOSE 104* 105* 91  BUN 19 20 15   CREATININE 0.72 0.80 4.09  CALCIUM 9.9  --  9.2   Liver Function Tests:  Recent Labs Lab 06/14/13 1612 06/15/13 0250  AST 21 16  ALT 16 11  ALKPHOS 100 81  BILITOT 0.3 0.4  PROT 7.6 6.5  ALBUMIN 4.0 3.2*   No results found for this basename: LIPASE, AMYLASE,  in the last 168 hours No results found for this basename: AMMONIA,  in the last 168 hours CBC:  Recent Labs Lab 06/14/13 1612 06/14/13 1631 06/15/13 0250  WBC 8.1  --  6.2  NEUTROABS 5.1  --   --  HGB 14.5 14.6 12.7  HCT 42.6 43.0 37.8  MCV 88.6  --  88.5  PLT 281  --  258   Cardiac Enzymes: No results found for this basename: CKTOTAL, CKMB, CKMBINDEX, TROPONINI,  in the last 168 hours BNP: BNP (last 3 results) No results found for this basename: PROBNP,  in the last 8760 hours CBG:  Recent Labs Lab 06/14/13 1551  GLUCAP 111*       Signed:  REGALADO,BELKYS  [facility] 06/17/2013, 3:11 PM

## 2013-06-17 NOTE — Progress Notes (Signed)
VASCULAR LAB PRELIMINARY  PRELIMINARY  PRELIMINARY  PRELIMINARY  Bilateral lower extremity venous Dopplers completed.    Preliminary report:  There is no DVT or SVT noted in the bilateral lower extremities.  Kainalu Heggs, RVT 06/17/2013, 2:04 PM

## 2013-06-17 NOTE — Progress Notes (Signed)
Stroke Team Progress Note  HISTORY Kerri Snyder is an 73 y.o. female who reports going to bed normal last evening. She awakened this morning at 0530 and noted slurred speech. She describes it as feeling as if her tongue would not move fast enough. She was saying her prayers and noticed that she sounded funny. Patient had breakfast and subsequently went to work. At work her supervisor also noticed her slurred speech and recommended she present to the ED. Her symptoms have improved somewhat since presentation. She does not report any weakness or numbness.  last known well: 06/13/2013 at 22:30 Patient was not administerd TPA secondary to delay in arrival. She was admitted for further evaluation and treatment.  SUBJECTIVE Patient just back to room post TEE. She is aware of the "hole in her heart".   OBJECTIVE Most recent Vital Signs: Filed Vitals:   06/17/13 0921 06/17/13 0930 06/17/13 0940 06/17/13 0950  BP:  129/67 158/80 156/68  Pulse: 76 90 93 84  Temp:      TempSrc:      Resp: 11 18 15 18   Height:      Weight:      SpO2: 99% 99% 99% 99%   CBG (last 3)   Recent Labs  06/14/13 1551  GLUCAP 111*    IV Fluid Intake:      MEDICATIONS  . atorvastatin  10 mg Oral q1800  . clopidogrel  75 mg Oral Q breakfast  . enoxaparin (LOVENOX) injection  40 mg Subcutaneous Q24H   PRN:  acetaminophen, acetaminophen, senna-docusate  Diet:  Cardiac thin liquids Activity:  OOB with assistance DVT Prophylaxis:  Lovenox 40 mg sq daily   CLINICALLY SIGNIFICANT STUDIES Basic Metabolic Panel:   Recent Labs Lab 06/14/13 1612 06/14/13 1631 06/15/13 0250  NA 141 143 144  K 4.3 3.9 3.9  CL 103 107 107  CO2 24  --  23  GLUCOSE 104* 105* 91  BUN 19 20 15   CREATININE 0.72 0.80 0.63  CALCIUM 9.9  --  9.2   Liver Function Tests:   Recent Labs Lab 06/14/13 1612 06/15/13 0250  AST 21 16  ALT 16 11  ALKPHOS 100 81  BILITOT 0.3 0.4  PROT 7.6 6.5  ALBUMIN 4.0 3.2*   CBC:   Recent  Labs Lab 06/14/13 1612 06/14/13 1631 06/15/13 0250  WBC 8.1  --  6.2  NEUTROABS 5.1  --   --   HGB 14.5 14.6 12.7  HCT 42.6 43.0 37.8  MCV 88.6  --  88.5  PLT 281  --  258   Coagulation:   Recent Labs Lab 06/14/13 1612 06/14/13 1724 06/15/13 0250  LABPROT >90.0* 12.3 13.4  INR >10.00* 0.93 1.04   Cardiac Enzymes: No results found for this basename: CKTOTAL, CKMB, CKMBINDEX, TROPONINI,  in the last 168 hours Urinalysis:   Recent Labs Lab 06/14/13 1727  COLORURINE YELLOW  LABSPEC 1.016  PHURINE 7.0  GLUCOSEU NEGATIVE  HGBUR NEGATIVE  BILIRUBINUR NEGATIVE  KETONESUR NEGATIVE  PROTEINUR NEGATIVE  UROBILINOGEN 0.2  NITRITE NEGATIVE  LEUKOCYTESUR NEGATIVE   Lipid Panel    Component Value Date/Time   CHOL 176 06/15/2013 0250   TRIG 64 06/15/2013 0250   HDL 59 06/15/2013 0250   CHOLHDL 3.0 06/15/2013 0250   VLDL 13 06/15/2013 0250   LDLCALC 104* 06/15/2013 0250   HgbA1C  Lab Results  Component Value Date   HGBA1C 5.8* 06/15/2013    Urine Drug Screen:     Component Value  Date/Time   LABOPIA NONE DETECTED 06/14/2013 1727   COCAINSCRNUR NONE DETECTED 06/14/2013 1727   LABBENZ NONE DETECTED 06/14/2013 1727   AMPHETMU NONE DETECTED 06/14/2013 1727   THCU NONE DETECTED 06/14/2013 1727   LABBARB NONE DETECTED 06/14/2013 1727    Alcohol Level:   Recent Labs Lab 06/14/13 1612  ETH <11    CT of the brain  06/14/2013   1. There is mild age appropriate diffuse cerebral and cerebellar atrophy. 2. There is no evidence of an acute ischemic or hemorrhagic infarction. 3. The air-fluid levels and at mucoperiosteal thickening within the maxillary and sphenoid and left ethmoid sinus cells is consistent with acute sinusitis.     CT angio neck 06/16/2013 Critical stenosis of the proximal right internal carotid artery.  Circumferential calcification is present within 90% stenosis at the distal aspect of the plaque in the internal carotid artery. There is  also an 85% diameter stenosis of the proximal  right external carotid artery. 70% diameter stenosis of the proximal left internal carotid artery and left external carotid artery. Both vertebral arteries are patent without significant stenosis.  MRI of the brain  06/14/2013   1. Small, acute left basal ganglia infarct. 2. Mild chronic small vessel ischemic disease. 3. Bilateral mastoid effusions and bilateral paranasal sinus mucosal thickening and fluid   MRA of the brain  06/14/2013   No evidence of major intracranial arterial occlusion or flow limiting stenosis. 2 mm right P-comm infundibulum favored over small aneurysm.     2D Echocardiogram  EF 60-65% with no source of embolus. . There is a focal rounded density on the tip of the  \anterior mitral valve leaflet that most likely represents focal calcification but cannot rule out vegetation by this study.  Carotid Doppler  Right: 40-59% internal carotid artery stenosis. Left: 60-79% internal carotid artery stenosis. Bilateral vertebral artery flow is antegrade.  TEE no thrombus. + PFO by bubble contrast  CXR  06/15/2013   There is hyperinflation consistent with COPD. There is no evidence of atelectasis, pneumonia, CHF, or other acute cardiopulmonary disease.     EKG  normal sinus rhythm. For complete results please see formal report.   Therapy Recommendations no PT or OT  Physical Exam   Awake alert. Afebrile. Head is nontraumatic. Neck is supple without bruit. Hearing is normal. Cardiac exam no murmur or gallop. Lungs are clear to auscultation. Distal pulses are well felt. Neurological Exam ;  Awake  Alert oriented x 3. Normal speech and language.eye movements full without nystagmus.fundi were not visualized. Vision acuity and fields appear normal. Hearing is normal. Palatal movements are normal. Face symmetric. Tongue midline. Normal strength, tone, reflexes and coordination. Normal sensation. Gait deferred.  ASSESSMENT [name] is a 73 y.o. female presenting with slurred speech.   Imaging confirms a small left basal ganglia infarct. Given location, infarct most likely thrombotic secondary to small vessel disease. Do not feel abnormal finding on 2D echo is associated with current stroke. Valves normal on TEE; PFO likely an incidental finding. Will check LE  Venous dopplers.   On aspirin 81 mg x 2 po daily prior to admission. Now on clopidogrel 75 mg orally every day for secondary stroke prevention. Patient with resultant slurred speech resolved. Stroke work up completed.  Hyperlipidemia, LDL 104, on crestor PTA, now on crestor, goal LDL < 100   Legally blind 21/308  [place]  ICA stenosis: Right: 40-59%, Left: 60-79%; CTA confirmed critical stenosis proximal R ICA 90, 85%  proximal R ECA, and 70% proximal L ICA and L ECA. VVS consulted (Early). recommended staged bilateral endarterectomies. left first since this is her symptomatic side and she is left brain dominant. Plan discharge to home with readmission next week for left carotid endarterectomy.   Hospital day # 3  TREATMENT/PLAN  Continue clopidogrel 75 mg orally every day for secondary stroke prevention. check bilateral lower extremity venous dopplers to rule out DVT - I ordered Agree with planned staged CEAs, L then R. Ok for discharge from neuro perspective.  Follow up Dr. Pearlean BrownieSethi in 2 months.  Annie MainSHARON BIBY, MSN, RN, ANVP-BC, ANP-BC, Lawernce IonGNP-BC [facility] Pager: [phone] 06/17/2013 10:38 AM  I have personally obtained a history, examined the patient, evaluated imaging results, and formulated the assessment and plan of care. I agree with the above.  Delia HeadyPramod Sethi, MD

## 2013-06-17 NOTE — H&P (View-Only) (Signed)
[facility] PROGRESS NOTE  Kerri Snyder ZOX:[id number] DOB: 02/10/1941 DOA: 06/14/2013 PCP: Allean Found, MD  Assessment/Plan: 1. CVA -Small, acute left basal ganglia infarct on MRI brain -MRA unremarkable -ECHO; There is a focal rounded density on the tip of the anterior mitral valve leaflet that most likely represents focal calcification but cannot rule out vegetation by this study. -carotid duplex: Right: 40-59% internal carotid artery stenosis. Left: 60-79% internal carotid artery stenosis. Bilateral vertebral artery flow is antegrade. -Ct Neck ordered. Vascular consulted.  -LDL 104, continue crestor, now atorvastatin -Hbaic 5.8 -Neuro following -Plan for TEE tomorrow.  -on plavix for secondary stroke prevention.   2. Dyslipidemia -continue statin  DVT  proph: lovenox  Code Status: Full Code Family Communication: no family at bedside Disposition Plan: home pending complete workup   Consultants:  Neuro  HPI/Subjective: Speech better. No complaints.   Objective: Filed Vitals:   06/16/13 0900  BP: 99/71  Pulse:   Temp:   Resp:    No intake or output data in the 24 hours ending 06/16/13 1441 Filed Weights   06/14/13 2000  Weight: 63.64 kg (140 lb 4.8 oz)    Exam:   General:  AAOx3, no distress, no dysarthria   Cardiovascular: S1S2/RRR  Respiratory: CTAB  Abdomen: soft, Nt, BS present  Musculoskeletal: no edema c/c  Neuro: non focal   Data Reviewed: Basic Metabolic Panel:  Recent Labs Lab 06/14/13 1612 06/14/13 1631 06/15/13 0250  NA 141 143 144  K 4.3 3.9 3.9  CL 103 107 107  CO2 24  --  23  GLUCOSE 104* 105* 91  BUN 19 20 15   CREATININE 0.72 0.80 0.63  CALCIUM 9.9  --  9.2   Liver Function Tests:  Recent Labs Lab 06/14/13 1612 06/15/13 0250  AST 21 16  ALT 16 11  ALKPHOS 100 81  BILITOT 0.3 0.4  PROT 7.6 6.5  ALBUMIN 4.0 3.2*   No results found for this basename: LIPASE, AMYLASE,  in the last 168 hours No  results found for this basename: AMMONIA,  in the last 168 hours CBC:  Recent Labs Lab 06/14/13 1612 06/14/13 1631 06/15/13 0250  WBC 8.1  --  6.2  NEUTROABS 5.1  --   --   HGB 14.5 14.6 12.7  HCT 42.6 43.0 37.8  MCV 88.6  --  88.5  PLT 281  --  258   Cardiac Enzymes: No results found for this basename: CKTOTAL, CKMB, CKMBINDEX, TROPONINI,  in the last 168 hours BNP (last 3 results) No results found for this basename: PROBNP,  in the last 8760 hours CBG:  Recent Labs Lab 06/14/13 1551  GLUCAP 111*    No results found for this or any previous visit (from the past 240 hour(s)).   Studies: Dg Chest 2 View  06/15/2013   CLINICAL DATA:  History of a stroke and mild weakness  EXAM: CHEST  2 VIEW  COMPARISON:  None.  FINDINGS: The lungs are hyperinflated and clear. There is an increased AP dimension of the thorax. The cardiac silhouette is normal in size. The pulmonary vascularity is not engorged. There is no pleural effusion. The observed portions of the bony thorax exhibit no acute abnormalities.  IMPRESSION: There is hyperinflation consistent with COPD. There is no evidence of atelectasis, pneumonia, CHF, or other acute cardiopulmonary disease.   Electronically Signed   By: David  [place]   On: 06/15/2013 09:00   Ct Head Wo Contrast  06/14/2013   CLINICAL DATA:  Onset of slurred speech this morning  EXAM: CT HEAD WITHOUT CONTRAST  TECHNIQUE: Contiguous axial images were obtained from the base of the skull through the vertex without intravenous contrast.  COMPARISON:  None.  FINDINGS: The ventricles are normal in size and position. There is mild age appropriate diffuse cerebral atrophy. There is no shift of the midline. There is no evidence of an acute intracranial hemorrhage nor of an evolving ischemic infarction. There are no abnormal intracranial calcifications. There are punctate basal ganglia calcifications bilaterally.  At bone window settings there are air-fluid levels within the  maxillary and sphenoid sinuses as well as a left ethmoid sinus cell. The frontals sinuses are clear. The mastoid air cells are well pneumatized. There is no evidence of an acute skull fracture.  IMPRESSION: 1. There is mild age appropriate diffuse cerebral and cerebellar atrophy. 2. There is no evidence of an acute ischemic or hemorrhagic infarction. 3. The air-fluid levels and at mucoperiosteal thickening within the maxillary and sphenoid and left ethmoid sinus cells is consistent with acute sinusitis.   Electronically Signed   By: David  [place]   On: 06/14/2013 16:40   Ct Angio Neck W/cm &/or Wo/cm  06/16/2013   CLINICAL DATA:  Stroke.  Carotid stenosis  EXAM: CT ANGIOGRAPHY NECK  TECHNIQUE: Multidetector CT imaging of the neck was performed using the standard protocol during bolus administration of intravenous contrast. Multiplanar CT image reconstructions and MIPs were obtained to evaluate the vascular anatomy. Carotid stenosis measurements (when applicable) are obtained utilizing NASCET criteria, using the distal internal carotid diameter as the denominator.  CONTRAST:  50mL OMNIPAQUE IOHEXOL 350 MG/ML SOLN  COMPARISON:  MRI head 06/14/2013  FINDINGS: Apical lung scarring bilaterally involving the pleura and parenchyma. Negative for mass or adenopathy in the neck. Cervical spondylosis and facet degeneration. 2 mm anterior slip C4-5 appears degenerative.  Standard 3 vessel aortic arch with mild atherosclerotic disease. Proximal great vessels show mild atherosclerotic change without significant stenosis.  Right carotid: Calcified plaque in the right common carotid artery without significant stenosis. Circumferential calcification involving the proximal right internal carotid artery. There is a critical stenosis at the upper aspect of this plaque narrowing the lumen by approximately 90% diameter stenosis. No dissection. Remainder of the right internal carotid artery is patent to the skullbase. Atherosclerotic  calcification in the cavernous carotid is noted. 85% diameter stenosis of the proximal right external carotid artery.  Left carotid: Calcified plaque along the common carotid artery without significant stenosis. Atherosclerotic plaque in the carotid bulb narrowing the lumen by approximately 70% diameter stenosis. 70% diameter stenosis left external carotid artery at the origin.  Both vertebral arteries are patent to the basilar without stenosis.  Air-fluid levels are noted in the maxillary sinus bilaterally. These were also present on 06/14/2013  Review of the MIP images confirms the above findings.  IMPRESSION: Critical stenosis of the proximal right internal carotid artery. Circumferential calcification is present within 90% stenosis at the distal aspect of the plaque in the internal carotid artery. There is also an 85% diameter stenosis of the proximal right external carotid artery.  70% diameter stenosis of the proximal left internal carotid artery and left external carotid artery.  Both vertebral arteries are patent without significant stenosis.   Electronically Signed   By: Marlan Palau M.D.   On: 06/16/2013 10:44   Mr Maxine Glenn Head Wo Contrast  06/14/2013   CLINICAL DATA:  Slurred speech and right upper extremity weakness.  EXAM: MRI HEAD WITHOUT CONTRAST  MRA HEAD WITHOUT CONTRAST  TECHNIQUE: Multiplanar, multiecho pulse sequences of the brain and surrounding structures were obtained without intravenous contrast. Angiographic images of the head were obtained using MRA technique without contrast.  COMPARISON:  Head CT 06/14/2013  FINDINGS: MRI HEAD FINDINGS  There is a small, acute infarct involving the left lentiform nucleus and corona radiata. Linear susceptibility artifact along the superior right cerebellar folia may reflect remote hemorrhage, possibly subarachnoid. There is no other evidence of intracranial hemorrhage.  Mild age-related cerebral atrophy is noted. Small, scattered foci of T2 hyperintensity  within the subcortical and deep cerebral white matter are nonspecific but compatible with mild chronic small vessel ischemic disease. There is no evidence of mass, midline shift, or extra-axial fluid collection.  Major intracranial vascular flow voids appear preserved. There are bilateral mastoid effusions. A small amount of fluid is present within both maxillary sinuses. Mild mucosal thickening is present in the maxillary sinuses, sphenoid sinuses, and ethmoid air cells bilaterally. There is slight anterolisthesis of C3 on C4 and C4 on C5.  MRA HEAD FINDINGS  The visualized distal vertebral arteries are patent with the left being dominant. Right PICA origin is patent. Left PICA is not clearly identified. The left AICA is dominant. SCAs are patent. The proximal right PCA is tortuous but otherwise unremarkable. The left P1 segment is hypoplastic with a prominent left posterior communicating artery identified. The left PCA is otherwise unremarkable.  Internal carotid arteries are patent from skullbase to carotid termini. Small inferiorly directed outpouching from the supraclinoid right ICA measuring approximately 2 mm likely represents a P-comm infundibulum. ACAs and MCAs are unremarkable. There is a small, patent anterior communicating artery.  IMPRESSION: 1. Small, acute left basal ganglia infarct. 2. Mild chronic small vessel ischemic disease. 3. Bilateral mastoid effusions and bilateral paranasal sinus mucosal thickening and fluid as above. 4. No evidence of major intracranial arterial occlusion or flow limiting stenosis. 2 mm right P-comm infundibulum favored over small aneurysm.   Electronically Signed   By: Sebastian Ache   On: 06/14/2013 19:50   Mr Brain Wo Contrast  06/14/2013   CLINICAL DATA:  Slurred speech and right upper extremity weakness.  EXAM: MRI HEAD WITHOUT CONTRAST  MRA HEAD WITHOUT CONTRAST  TECHNIQUE: Multiplanar, multiecho pulse sequences of the brain and surrounding structures were obtained  without intravenous contrast. Angiographic images of the head were obtained using MRA technique without contrast.  COMPARISON:  Head CT 06/14/2013  FINDINGS: MRI HEAD FINDINGS  There is a small, acute infarct involving the left lentiform nucleus and corona radiata. Linear susceptibility artifact along the superior right cerebellar folia may reflect remote hemorrhage, possibly subarachnoid. There is no other evidence of intracranial hemorrhage.  Mild age-related cerebral atrophy is noted. Small, scattered foci of T2 hyperintensity within the subcortical and deep cerebral white matter are nonspecific but compatible with mild chronic small vessel ischemic disease. There is no evidence of mass, midline shift, or extra-axial fluid collection.  Major intracranial vascular flow voids appear preserved. There are bilateral mastoid effusions. A small amount of fluid is present within both maxillary sinuses. Mild mucosal thickening is present in the maxillary sinuses, sphenoid sinuses, and ethmoid air cells bilaterally. There is slight anterolisthesis of C3 on C4 and C4 on C5.  MRA HEAD FINDINGS  The visualized distal vertebral arteries are patent with the left being dominant. Right PICA origin is patent. Left PICA is not clearly identified. The left AICA is dominant. SCAs are patent. The proximal right PCA is tortuous  but otherwise unremarkable. The left P1 segment is hypoplastic with a prominent left posterior communicating artery identified. The left PCA is otherwise unremarkable.  Internal carotid arteries are patent from skullbase to carotid termini. Small inferiorly directed outpouching from the supraclinoid right ICA measuring approximately 2 mm likely represents a P-comm infundibulum. ACAs and MCAs are unremarkable. There is a small, patent anterior communicating artery.  IMPRESSION: 1. Small, acute left basal ganglia infarct. 2. Mild chronic small vessel ischemic disease. 3. Bilateral mastoid effusions and bilateral  paranasal sinus mucosal thickening and fluid as above. 4. No evidence of major intracranial arterial occlusion or flow limiting stenosis. 2 mm right P-comm infundibulum favored over small aneurysm.   Electronically Signed   By: Sebastian AcheAllen  Grady   On: 06/14/2013 19:50    Scheduled Meds: . atorvastatin  20 mg Oral q1800  . clopidogrel  75 mg Oral Q breakfast  . enoxaparin (LOVENOX) injection  40 mg Subcutaneous Q24H   Continuous Infusions:   Antibiotics Given (last 72 hours)   None      Principal Problem:   Slurred speech Active Problems:   TIA (transient ischemic attack)   Elevated blood pressure   Hyperlipidemia   Supratherapeutic INR   CVA (cerebral infarction)    Time spent: 25min    Lai Hendriks  [facility] Pager [phone]. If 7PM-7AM, please contact night-coverage at [email or web address], password [email or web address] 06/16/2013, 2:41 PM  LOS: 2 days

## 2013-06-17 NOTE — Progress Notes (Signed)
  Echocardiogram Echocardiogram Transesophageal has been performed.  Margreta JourneyLOMBARDO, Honey Zakarian 06/17/2013, 9:39 AM

## 2013-06-18 ENCOUNTER — Other Ambulatory Visit: Payer: Self-pay | Admitting: *Deleted

## 2013-06-18 ENCOUNTER — Telehealth: Payer: Self-pay | Admitting: Neurology

## 2013-06-18 NOTE — Telephone Encounter (Signed)
Patient calling wanting clearance letter to go back to work doing light duty -[employer] [address] [phone].  Please call to advise.

## 2013-06-21 ENCOUNTER — Encounter (HOSPITAL_COMMUNITY): Payer: Self-pay | Admitting: Cardiovascular Disease

## 2013-06-21 ENCOUNTER — Telehealth: Payer: Self-pay | Admitting: *Deleted

## 2013-06-21 NOTE — Telephone Encounter (Signed)
Patient calling requesting clearance to go back to work. Please advise

## 2013-06-21 NOTE — Telephone Encounter (Signed)
Okay to go back to work without restrictions

## 2013-06-21 NOTE — Telephone Encounter (Signed)
Patient has been discharged to go back to work by Dr. Arbie CookeyEarly and needs Dr. Pearlean BrownieSethi to say she can go back to work at [employer].  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  Patient states she would not be doing any lifting.  Please advise.

## 2013-06-21 NOTE — Telephone Encounter (Signed)
Patient calling to follow up on whether or not she can go back to work, says she has been cleared by Dr. Arbie CookeyEarly but that she needs permission from Dr. Pearlean BrownieSethi as well. Please call patient and advise.

## 2013-06-24 ENCOUNTER — Encounter (HOSPITAL_COMMUNITY)
Admission: RE | Admit: 2013-06-24 | Discharge: 2013-06-24 | Disposition: A | Discharge status: Home / Self Care | Payer: Medicare Other | Source: Ambulatory Visit | Attending: Vascular Surgery | Admitting: Vascular Surgery

## 2013-06-24 ENCOUNTER — Encounter (HOSPITAL_COMMUNITY): Payer: Self-pay

## 2013-06-24 LAB — COMPREHENSIVE METABOLIC PANEL
ALBUMIN: 3.9 g/dL (ref 3.5–5.2)
ALT: 16 U/L (ref 0–35)
AST: 20 U/L (ref 0–37)
Alkaline Phosphatase: 90 U/L (ref 39–117)
BILIRUBIN TOTAL: 0.4 mg/dL (ref 0.3–1.2)
BUN: 14 mg/dL (ref 6–23)
CHLORIDE: 100 meq/L (ref 96–112)
CO2: 27 mEq/L (ref 19–32)
CREATININE: 0.64 mg/dL (ref 0.50–1.10)
Calcium: 10 mg/dL (ref 8.4–10.5)
GFR calc non Af Amer: 86 mL/min — ABNORMAL LOW (ref >90)
GLUCOSE: 90 mg/dL (ref 70–99)
Potassium: 5.1 mEq/L (ref 3.7–5.3)
Sodium: 140 mEq/L (ref 137–147)
Total Protein: 7 g/dL (ref 6.0–8.3)

## 2013-06-24 LAB — PROTIME-INR
INR: 0.93 (ref 0.00–1.49)
PROTHROMBIN TIME: 12.3 s (ref 11.6–15.2)

## 2013-06-24 LAB — CBC
HCT: 39.8 % (ref 36.0–46.0)
HEMOGLOBIN: 13.6 g/dL (ref 12.0–15.0)
MCH: 30.2 pg (ref 26.0–34.0)
MCHC: 34.2 g/dL (ref 30.0–36.0)
MCV: 88.2 fL (ref 78.0–100.0)
Platelets: 243 10*3/uL (ref 150–400)
RBC: 4.51 MIL/uL (ref 3.87–5.11)
RDW: 12.8 % (ref 11.5–15.5)
WBC: 6.6 10*3/uL (ref 4.0–10.5)

## 2013-06-24 LAB — TYPE AND SCREEN
ABO/RH(D): O NEG
Antibody Screen: NEGATIVE

## 2013-06-24 LAB — URINALYSIS, ROUTINE W REFLEX MICROSCOPIC
BILIRUBIN URINE: NEGATIVE
Glucose, UA: NEGATIVE mg/dL
HGB URINE DIPSTICK: NEGATIVE
KETONES UR: NEGATIVE mg/dL
Leukocytes, UA: NEGATIVE
Nitrite: NEGATIVE
PH: 6.5 (ref 5.0–8.0)
Protein, ur: NEGATIVE mg/dL
Specific Gravity, Urine: 1.02 (ref 1.005–1.030)
Urobilinogen, UA: 0.2 mg/dL (ref 0.0–1.0)

## 2013-06-24 LAB — SURGICAL PCR SCREEN
MRSA, PCR: NEGATIVE
Staphylococcus aureus: POSITIVE — AB

## 2013-06-24 LAB — ABO/RH: ABO/RH(D): O NEG

## 2013-06-24 LAB — APTT: aPTT: 29 seconds (ref 24–37)

## 2013-06-24 MED ORDER — SODIUM CHLORIDE 0.9 % IV SOLN: INTRAVENOUS | Status: DC

## 2013-06-24 MED ORDER — DEXTROSE 5 % IV SOLN
1.5000 g | INTRAVENOUS | Status: AC
  Administered 2013-06-25: 1.5 g via INTRAVENOUS
  Filled 2013-06-24: qty 1.5

## 2013-06-24 NOTE — Progress Notes (Signed)
Please treat pt with Mupirocin on day of procedure for positive Staph result. Pharmacy closes at 8:00 PM and pt may not make it in time to pick up prescription.

## 2013-06-24 NOTE — Pre-Procedure Instructions (Signed)
Kerri Snyder  06/24/2013   Your procedure is scheduled on:  Friday March 13 th at 0800 AM  Report to [facility] Main Entrance "A" at 0600 AM.  Call this number if you have problems the morning of surgery: [phone]   Remember:   Do not eat food or drink liquids after midnight.   Take these medicines the morning of surgery with A SIP OF WATER: None Plavix was stopped on 06/21/13.  Do not wear jewelry, make-up or nail polish.  Do not wear lotions, powders, or perfumes. You may wear deodorant.  Do not shave 48 hours prior to surgery.   Do not bring valuables to the hospital.  [facility] is not responsible   for any belongings or valuables.               Contacts, dentures or bridgework may not be worn into surgery.  Leave suitcase in the car. After surgery it may be brought to your room.  For patients admitted to the hospital, discharge time is determined by your  treatment team.               Patients discharged the day of surgery will not be allowed to drive home.    Special Instructions: Kerri Snyder - Preparing for Surgery  Before surgery, you can play an important role.  Because skin is not sterile, your skin needs to be as free of germs as possible.  You can reduce the number of germs on you skin by washing with CHG (chlorahexidine gluconate) soap before surgery.  CHG is an antiseptic cleaner which kills germs and bonds with the skin to continue killing germs even after washing.  Please DO NOT use if you have an allergy to CHG or antibacterial soaps.  If your skin becomes reddened/irritated stop using the CHG and inform your nurse when you arrive at Short Stay.  Do not shave (including legs and underarms) for at least 48 hours prior to the first CHG shower.  You may shave your face.  Please follow these instructions carefully:   1.  Shower with CHG Soap the night before surgery and the                                morning of Surgery.  2.  If you choose to wash your hair,  wash your hair first as usual with your       normal shampoo.  3.  After you shampoo, rinse your hair and body thoroughly to remove the                      Shampoo.  4.  Use CHG as you would any other liquid soap.  You can apply chg directly       to the skin and wash gently with scrungie or a clean washcloth.  5.  Apply the CHG Soap to your body ONLY FROM THE NECK DOWN.        Do not use on open wounds or open sores.  Avoid contact with your eyes,       ears, mouth and genitals (private parts).  Wash genitals (private parts)       with your normal soap.  6.  Wash thoroughly, paying special attention to the area where your surgery        will be performed.  7.  Thoroughly rinse your body with  warm water from the neck down.  8.  DO NOT shower/wash with your normal soap after using and rinsing off       the CHG Soap.  9.  Pat yourself dry with a clean towel.            10.  Wear clean pajamas.            11.  Place clean sheets on your bed the night of your first shower and do not        sleep with pets.  Day of Surgery  Do not apply any lotions/deoderants the morning of surgery.  Please wear clean clothes to the hospital/surgery center.      Please read over the following fact sheets that you were given: Pain Booklet, Coughing and Deep Breathing, Blood Transfusion Information, MRSA Information and Surgical Site Infection Prevention

## 2013-06-25 ENCOUNTER — Encounter (HOSPITAL_COMMUNITY)
Admission: RE | Disposition: A | Discharge status: Home / Self Care | Payer: Self-pay | Source: Ambulatory Visit | Attending: Vascular Surgery

## 2013-06-25 ENCOUNTER — Inpatient Hospital Stay (HOSPITAL_COMMUNITY)
Admission: RE | Admit: 2013-06-25 | Discharge: 2013-06-26 | DRG: 039 | Disposition: A | Discharge status: Home / Self Care | Payer: Medicare Other | Source: Ambulatory Visit | Attending: Vascular Surgery | Admitting: Vascular Surgery

## 2013-06-25 ENCOUNTER — Encounter (HOSPITAL_COMMUNITY): Payer: Medicare Other | Admitting: Certified Registered Nurse Anesthetist

## 2013-06-25 ENCOUNTER — Inpatient Hospital Stay (HOSPITAL_COMMUNITY): Payer: Medicare Other | Admitting: Certified Registered Nurse Anesthetist

## 2013-06-25 ENCOUNTER — Encounter (HOSPITAL_COMMUNITY): Payer: Self-pay | Admitting: Anesthesiology

## 2013-06-25 DIAGNOSIS — H548 Legal blindness, as defined in USA: Secondary | ICD-10-CM | POA: Diagnosis present

## 2013-06-25 DIAGNOSIS — M51379 Other intervertebral disc degeneration, lumbosacral region without mention of lumbar back pain or lower extremity pain: Secondary | ICD-10-CM | POA: Diagnosis present

## 2013-06-25 DIAGNOSIS — I6529 Occlusion and stenosis of unspecified carotid artery: Secondary | ICD-10-CM

## 2013-06-25 DIAGNOSIS — Z8673 Personal history of transient ischemic attack (TIA), and cerebral infarction without residual deficits: Secondary | ICD-10-CM

## 2013-06-25 DIAGNOSIS — M5137 Other intervertebral disc degeneration, lumbosacral region: Secondary | ICD-10-CM | POA: Diagnosis present

## 2013-06-25 DIAGNOSIS — E785 Hyperlipidemia, unspecified: Secondary | ICD-10-CM | POA: Diagnosis present

## 2013-06-25 DIAGNOSIS — Z833 Family history of diabetes mellitus: Secondary | ICD-10-CM

## 2013-06-25 DIAGNOSIS — Z8249 Family history of ischemic heart disease and other diseases of the circulatory system: Secondary | ICD-10-CM

## 2013-06-25 HISTORY — PX: ENDARTERECTOMY: SHX5162

## 2013-06-25 HISTORY — PX: CAROTID ENDARTERECTOMY: SUR193

## 2013-06-25 LAB — CBC
HEMATOCRIT: 32.9 % — AB (ref 36.0–46.0)
Hemoglobin: 11.3 g/dL — ABNORMAL LOW (ref 12.0–15.0)
MCH: 30.2 pg (ref 26.0–34.0)
MCHC: 34.3 g/dL (ref 30.0–36.0)
MCV: 88 fL (ref 78.0–100.0)
Platelets: 170 10*3/uL (ref 150–400)
RBC: 3.74 MIL/uL — ABNORMAL LOW (ref 3.87–5.11)
RDW: 13 % (ref 11.5–15.5)
WBC: 10.5 10*3/uL (ref 4.0–10.5)

## 2013-06-25 LAB — CREATININE, SERUM
Creatinine, Ser: 0.54 mg/dL (ref 0.50–1.10)
GFR calc non Af Amer: >90 mL/min (ref >90)

## 2013-06-25 SURGERY — ENDARTERECTOMY, CAROTID
Anesthesia: General | Site: Neck | Laterality: Left

## 2013-06-25 MED ORDER — ALUM & MAG HYDROXIDE-SIMETH 200-200-20 MG/5ML PO SUSP: 15.0000 mL | ORAL | Status: DC | PRN

## 2013-06-25 MED ORDER — CEFUROXIME SODIUM 1.5 G IJ SOLR
1.5000 g | Freq: Two times a day (BID) | INTRAMUSCULAR | Status: AC
  Administered 2013-06-25 – 2013-06-26 (×2): 1.5 g via INTRAVENOUS
  Filled 2013-06-25 (×2): qty 1.5

## 2013-06-25 MED ORDER — STERILE WATER FOR INJECTION IJ SOLN
INTRAMUSCULAR | Status: AC
  Filled 2013-06-25: qty 10

## 2013-06-25 MED ORDER — MIDAZOLAM HCL 2 MG/2ML IJ SOLN
INTRAMUSCULAR | Status: AC
  Filled 2013-06-25: qty 2

## 2013-06-25 MED ORDER — LIDOCAINE HCL (CARDIAC) 20 MG/ML IV SOLN
INTRAVENOUS | Status: AC
  Filled 2013-06-25: qty 5

## 2013-06-25 MED ORDER — PHENYLEPHRINE HCL 10 MG/ML IJ SOLN
INTRAMUSCULAR | Status: DC | PRN
  Administered 2013-06-25 (×2): 80 ug via INTRAVENOUS
  Administered 2013-06-25: 40 ug via INTRAVENOUS

## 2013-06-25 MED ORDER — DOPAMINE-DEXTROSE 3.2-5 MG/ML-% IV SOLN: 3.0000 ug/kg/min | INTRAVENOUS | Status: DC

## 2013-06-25 MED ORDER — HYDRALAZINE HCL 20 MG/ML IJ SOLN: 10.0000 mg | INTRAMUSCULAR | Status: DC | PRN

## 2013-06-25 MED ORDER — ONDANSETRON HCL 4 MG/2ML IJ SOLN: 4.0000 mg | Freq: Four times a day (QID) | INTRAMUSCULAR | Status: DC | PRN

## 2013-06-25 MED ORDER — ROCURONIUM BROMIDE 50 MG/5ML IV SOLN
INTRAVENOUS | Status: AC
  Filled 2013-06-25: qty 1

## 2013-06-25 MED ORDER — MAGNESIUM SULFATE 40 MG/ML IJ SOLN: 2.0000 g | Freq: Every day | INTRAMUSCULAR | Status: DC | PRN

## 2013-06-25 MED ORDER — HYDROMORPHONE HCL PF 1 MG/ML IJ SOLN
0.2500 mg | INTRAMUSCULAR | Status: DC | PRN
  Administered 2013-06-25: 0.5 mg via INTRAVENOUS
  Administered 2013-06-25 (×2): 0.25 mg via INTRAVENOUS

## 2013-06-25 MED ORDER — METOPROLOL TARTRATE 1 MG/ML IV SOLN: 2.0000 mg | INTRAVENOUS | Status: DC | PRN

## 2013-06-25 MED ORDER — HEPARIN SODIUM (PORCINE) 1000 UNIT/ML IJ SOLN
INTRAMUSCULAR | Status: DC | PRN
  Administered 2013-06-25: 6 mL via INTRAVENOUS

## 2013-06-25 MED ORDER — SODIUM CHLORIDE 0.9 % IR SOLN
Status: DC | PRN
  Administered 2013-06-25: 08:00:00

## 2013-06-25 MED ORDER — FENTANYL CITRATE 0.05 MG/ML IJ SOLN
INTRAMUSCULAR | Status: AC
  Filled 2013-06-25: qty 5

## 2013-06-25 MED ORDER — LABETALOL HCL 5 MG/ML IV SOLN: 10.0000 mg | INTRAVENOUS | Status: DC | PRN

## 2013-06-25 MED ORDER — ASPIRIN 81 MG PO TABS: 81.0000 mg | ORAL_TABLET | Freq: Every day | ORAL | Status: DC

## 2013-06-25 MED ORDER — SODIUM CHLORIDE 0.9 % IV SOLN
INTRAVENOUS | Status: DC
  Administered 2013-06-25: 18:00:00 via INTRAVENOUS

## 2013-06-25 MED ORDER — NEOSTIGMINE METHYLSULFATE 1 MG/ML IJ SOLN
INTRAMUSCULAR | Status: DC | PRN
  Administered 2013-06-25: 4 mg via INTRAVENOUS

## 2013-06-25 MED ORDER — SENNOSIDES-DOCUSATE SODIUM 8.6-50 MG PO TABS
1.0000 | ORAL_TABLET | Freq: Every evening | ORAL | Status: DC | PRN
  Filled 2013-06-25: qty 1

## 2013-06-25 MED ORDER — MORPHINE SULFATE 2 MG/ML IJ SOLN
2.0000 mg | INTRAMUSCULAR | Status: DC | PRN
  Administered 2013-06-25: 2 mg via INTRAVENOUS
  Filled 2013-06-25: qty 1

## 2013-06-25 MED ORDER — FENTANYL CITRATE 0.05 MG/ML IJ SOLN
INTRAMUSCULAR | Status: DC | PRN
Start: 2013-06-25 — End: 2013-06-25
  Administered 2013-06-25: 50 ug via INTRAVENOUS
  Administered 2013-06-25: 25 ug via INTRAVENOUS
  Administered 2013-06-25: 100 ug via INTRAVENOUS

## 2013-06-25 MED ORDER — LIDOCAINE HCL (PF) 1 % IJ SOLN
INTRAMUSCULAR | Status: AC
  Filled 2013-06-25: qty 30

## 2013-06-25 MED ORDER — MUPIROCIN 2 % EX OINT
TOPICAL_OINTMENT | Freq: Two times a day (BID) | CUTANEOUS | Status: DC
  Administered 2013-06-25: 1 via NASAL
  Administered 2013-06-25 – 2013-06-26 (×2): via NASAL
  Filled 2013-06-25 (×2): qty 22

## 2013-06-25 MED ORDER — CHLORHEXIDINE GLUCONATE 4 % EX LIQD
60.0000 mL | Freq: Once | CUTANEOUS | Status: DC
  Filled 2013-06-25: qty 60

## 2013-06-25 MED ORDER — ONDANSETRON HCL 4 MG/2ML IJ SOLN
INTRAMUSCULAR | Status: AC
  Filled 2013-06-25: qty 2

## 2013-06-25 MED ORDER — OXYCODONE-ACETAMINOPHEN 5-325 MG PO TABS: 1.0000 | ORAL_TABLET | ORAL | Status: DC | PRN

## 2013-06-25 MED ORDER — 0.9 % SODIUM CHLORIDE (POUR BTL) OPTIME
TOPICAL | Status: DC | PRN
  Administered 2013-06-25: 1000 mL

## 2013-06-25 MED ORDER — ACETAMINOPHEN 650 MG RE SUPP: 325.0000 mg | RECTAL | Status: DC | PRN

## 2013-06-25 MED ORDER — ONDANSETRON HCL 4 MG/2ML IJ SOLN: 4.0000 mg | Freq: Once | INTRAMUSCULAR | Status: DC | PRN

## 2013-06-25 MED ORDER — MUPIROCIN 2 % EX OINT
TOPICAL_OINTMENT | CUTANEOUS | Status: AC
  Filled 2013-06-25: qty 22

## 2013-06-25 MED ORDER — ACETAMINOPHEN 325 MG PO TABS: 325.0000 mg | ORAL_TABLET | ORAL | Status: DC | PRN

## 2013-06-25 MED ORDER — GLYCOPYRROLATE 0.2 MG/ML IJ SOLN
INTRAMUSCULAR | Status: DC | PRN
  Administered 2013-06-25: .7 mg via INTRAVENOUS

## 2013-06-25 MED ORDER — PROPOFOL 10 MG/ML IV BOLUS
INTRAVENOUS | Status: AC
  Filled 2013-06-25: qty 20

## 2013-06-25 MED ORDER — ONDANSETRON HCL 4 MG/2ML IJ SOLN
INTRAMUSCULAR | Status: DC | PRN
  Administered 2013-06-25: 4 mg via INTRAVENOUS

## 2013-06-25 MED ORDER — LACTATED RINGERS IV SOLN
INTRAVENOUS | Status: DC | PRN
  Administered 2013-06-25 (×2): via INTRAVENOUS

## 2013-06-25 MED ORDER — EPHEDRINE SULFATE 50 MG/ML IJ SOLN
INTRAMUSCULAR | Status: AC
  Filled 2013-06-25: qty 1

## 2013-06-25 MED ORDER — PROPOFOL 10 MG/ML IV BOLUS
INTRAVENOUS | Status: DC | PRN
  Administered 2013-06-25: 200 mg via INTRAVENOUS
  Administered 2013-06-25: 20 mg via INTRAVENOUS

## 2013-06-25 MED ORDER — PHENYLEPHRINE HCL 10 MG/ML IJ SOLN
10.0000 mg | INTRAVENOUS | Status: DC | PRN
  Administered 2013-06-25: 50 ug/min via INTRAVENOUS

## 2013-06-25 MED ORDER — GLYCOPYRROLATE 0.2 MG/ML IJ SOLN
INTRAMUSCULAR | Status: AC
Start: 2013-06-25 — End: 2013-06-25
  Filled 2013-06-25: qty 4

## 2013-06-25 MED ORDER — SODIUM CHLORIDE 0.9 % IV SOLN
500.0000 mL | Freq: Once | INTRAVENOUS | Status: AC | PRN
Start: 2013-06-25 — End: 2013-06-25

## 2013-06-25 MED ORDER — PHENYLEPHRINE 40 MCG/ML (10ML) SYRINGE FOR IV PUSH (FOR BLOOD PRESSURE SUPPORT)
PREFILLED_SYRINGE | INTRAVENOUS | Status: AC
  Filled 2013-06-25: qty 10

## 2013-06-25 MED ORDER — LIDOCAINE HCL (CARDIAC) 20 MG/ML IV SOLN
INTRAVENOUS | Status: DC | PRN
  Administered 2013-06-25: 100 mg via INTRATRACHEAL
  Administered 2013-06-25 (×2): 100 mg via INTRAVENOUS

## 2013-06-25 MED ORDER — ENOXAPARIN SODIUM 40 MG/0.4ML ~~LOC~~ SOLN
40.0000 mg | SUBCUTANEOUS | Status: DC
  Administered 2013-06-26: 40 mg via SUBCUTANEOUS
  Filled 2013-06-25 (×2): qty 0.4

## 2013-06-25 MED ORDER — ATORVASTATIN CALCIUM 20 MG PO TABS
20.0000 mg | ORAL_TABLET | Freq: Every day | ORAL | Status: DC
  Filled 2013-06-25 (×2): qty 1

## 2013-06-25 MED ORDER — OXYCODONE-ACETAMINOPHEN 5-325 MG PO TABS
1.0000 | ORAL_TABLET | Freq: Four times a day (QID) | ORAL | Status: DC | PRN
Start: 2013-06-25 — End: 2013-08-04

## 2013-06-25 MED ORDER — POTASSIUM CHLORIDE CRYS ER 20 MEQ PO TBCR: 20.0000 meq | EXTENDED_RELEASE_TABLET | Freq: Every day | ORAL | Status: DC | PRN

## 2013-06-25 MED ORDER — GUAIFENESIN-DM 100-10 MG/5ML PO SYRP: 15.0000 mL | ORAL_SOLUTION | ORAL | Status: DC | PRN

## 2013-06-25 MED ORDER — PROTAMINE SULFATE 10 MG/ML IV SOLN
INTRAVENOUS | Status: DC | PRN
  Administered 2013-06-25: 10 mg via INTRAVENOUS
  Administered 2013-06-25 (×2): 20 mg via INTRAVENOUS

## 2013-06-25 MED ORDER — PHENOL 1.4 % MT LIQD: 1.0000 | OROMUCOSAL | Status: DC | PRN

## 2013-06-25 MED ORDER — CLOPIDOGREL BISULFATE 75 MG PO TABS
75.0000 mg | ORAL_TABLET | Freq: Every day | ORAL | Status: DC
  Administered 2013-06-26: 75 mg via ORAL
  Filled 2013-06-25 (×2): qty 1

## 2013-06-25 MED ORDER — PANTOPRAZOLE SODIUM 40 MG PO TBEC
40.0000 mg | DELAYED_RELEASE_TABLET | Freq: Every day | ORAL | Status: DC
  Filled 2013-06-25 (×2): qty 1

## 2013-06-25 MED ORDER — NEOSTIGMINE METHYLSULFATE 1 MG/ML IJ SOLN
INTRAMUSCULAR | Status: AC
  Filled 2013-06-25: qty 10

## 2013-06-25 MED ORDER — DOCUSATE SODIUM 100 MG PO CAPS
100.0000 mg | ORAL_CAPSULE | Freq: Every day | ORAL | Status: DC
  Administered 2013-06-26: 100 mg via ORAL
  Filled 2013-06-25: qty 1

## 2013-06-25 MED ORDER — ASPIRIN EC 81 MG PO TBEC
81.0000 mg | DELAYED_RELEASE_TABLET | Freq: Every day | ORAL | Status: DC
  Administered 2013-06-26: 81 mg via ORAL
  Filled 2013-06-25: qty 1

## 2013-06-25 MED ORDER — ROCURONIUM BROMIDE 100 MG/10ML IV SOLN
INTRAVENOUS | Status: DC | PRN
  Administered 2013-06-25: 40 mg via INTRAVENOUS

## 2013-06-25 MED ORDER — HYDROMORPHONE HCL PF 1 MG/ML IJ SOLN
INTRAMUSCULAR | Status: AC
  Administered 2013-06-25: 0.25 mg via INTRAVENOUS
  Filled 2013-06-25: qty 1

## 2013-06-25 SURGICAL SUPPLY — 52 items
BENZOIN TINCTURE PRP APPL 2/3 (GAUZE/BANDAGES/DRESSINGS) ×3 IMPLANT
CANISTER SUCTION 2500CC (MISCELLANEOUS) ×3 IMPLANT
CATH ROBINSON RED A/P 18FR (CATHETERS) ×3 IMPLANT
CLIP LIGATING EXTRA MED SLVR (CLIP) ×3 IMPLANT
CLIP LIGATING EXTRA SM BLUE (MISCELLANEOUS) ×3 IMPLANT
CLOSURE WOUND 1/2 X4 (GAUZE/BANDAGES/DRESSINGS) ×1
COVER SURGICAL LIGHT HANDLE (MISCELLANEOUS) ×3 IMPLANT
CRADLE DONUT ADULT HEAD (MISCELLANEOUS) ×3 IMPLANT
DECANTER SPIKE VIAL GLASS SM (MISCELLANEOUS) IMPLANT
DERMABOND ADHESIVE PROPEN (GAUZE/BANDAGES/DRESSINGS) ×2
DERMABOND ADVANCED .7 DNX6 (GAUZE/BANDAGES/DRESSINGS) ×1 IMPLANT
DRAIN HEMOVAC 1/8 X 5 (WOUND CARE) IMPLANT
DRAPE WARM FLUID 44X44 (DRAPE) ×3 IMPLANT
DRSG COVADERM 4X6 (GAUZE/BANDAGES/DRESSINGS) ×3 IMPLANT
ELECT REM PT RETURN 9FT ADLT (ELECTROSURGICAL) ×3
ELECTRODE REM PT RTRN 9FT ADLT (ELECTROSURGICAL) ×1 IMPLANT
EVACUATOR SILICONE 100CC (DRAIN) IMPLANT
GEL ULTRASOUND 20GR AQUASONIC (MISCELLANEOUS) IMPLANT
GLOVE BIO SURGEON STRL SZ 6.5 (GLOVE) ×6 IMPLANT
GLOVE BIO SURGEONS STRL SZ 6.5 (GLOVE) ×3
GLOVE BIOGEL PI IND STRL 6.5 (GLOVE) ×2 IMPLANT
GLOVE BIOGEL PI IND STRL 7.5 (GLOVE) ×1 IMPLANT
GLOVE BIOGEL PI INDICATOR 6.5 (GLOVE) ×4
GLOVE BIOGEL PI INDICATOR 7.5 (GLOVE) ×2
GLOVE SS BIOGEL STRL SZ 7 (GLOVE) ×1 IMPLANT
GLOVE SS BIOGEL STRL SZ 7.5 (GLOVE) ×1 IMPLANT
GLOVE SUPERSENSE BIOGEL SZ 7 (GLOVE) ×2
GLOVE SUPERSENSE BIOGEL SZ 7.5 (GLOVE) ×2
GOWN STRL REUS W/ TWL LRG LVL3 (GOWN DISPOSABLE) ×4 IMPLANT
GOWN STRL REUS W/TWL LRG LVL3 (GOWN DISPOSABLE) ×8
KIT BASIN OR (CUSTOM PROCEDURE TRAY) ×3 IMPLANT
KIT ROOM TURNOVER OR (KITS) ×3 IMPLANT
KIT SHUNT ARGYLE CAROTID ART 6 (VASCULAR PRODUCTS) ×3 IMPLANT
NEEDLE 22X1 1/2 (OR ONLY) (NEEDLE) IMPLANT
NS IRRIG 1000ML POUR BTL (IV SOLUTION) ×6 IMPLANT
PACK CAROTID (CUSTOM PROCEDURE TRAY) ×3 IMPLANT
PAD ARMBOARD 7.5X6 YLW CONV (MISCELLANEOUS) ×6 IMPLANT
PATCH HEMASHIELD 8X150 (Vascular Products) ×3 IMPLANT
SHUNT CAROTID BYPASS 10 (VASCULAR PRODUCTS) IMPLANT
SHUNT CAROTID BYPASS 12FRX15.5 (VASCULAR PRODUCTS) IMPLANT
STRIP CLOSURE SKIN 1/2X4 (GAUZE/BANDAGES/DRESSINGS) ×2 IMPLANT
SUT ETHILON 3 0 PS 1 (SUTURE) IMPLANT
SUT PROLENE 6 0 CC (SUTURE) ×15 IMPLANT
SUT SILK 3 0 (SUTURE)
SUT SILK 3-0 18XBRD TIE 12 (SUTURE) IMPLANT
SUT VIC AB 3-0 SH 27 (SUTURE) ×4
SUT VIC AB 3-0 SH 27X BRD (SUTURE) ×2 IMPLANT
SUT VICRYL 4-0 PS2 18IN ABS (SUTURE) ×3 IMPLANT
SYR CONTROL 10ML LL (SYRINGE) IMPLANT
TOWEL OR 17X24 6PK STRL BLUE (TOWEL DISPOSABLE) ×3 IMPLANT
TOWEL OR 17X26 10 PK STRL BLUE (TOWEL DISPOSABLE) ×3 IMPLANT
WATER STERILE IRR 1000ML POUR (IV SOLUTION) ×3 IMPLANT

## 2013-06-25 NOTE — Anesthesia Procedure Notes (Signed)
Procedure Name: Intubation Date/Time: 06/25/2013 8:07 AM Performed by: FLOWERS, ROKOSHI T Pre-anesthesia Checklist: Patient identified, Timeout performed, Emergency Drugs available, Patient being monitored and Suction available Patient Re-evaluated:Patient Re-evaluated prior to inductionOxygen Delivery Method: Circle system utilized and Simple face mask Preoxygenation: Pre-oxygenation with 100% oxygen Intubation Type: IV induction Ventilation: Mask ventilation without difficulty Laryngoscope Size: Miller and 2 Grade View: Grade I Tube type: Oral Tube size: 7.5 mm Number of attempts: 1 Airway Equipment and Method: Patient positioned with wedge pillow,  Stylet and LTA kit utilized Placement Confirmation: ETT inserted through vocal cords under direct vision,  positive ETCO2 and breath sounds checked- equal and bilateral Secured at: 22 cm Tube secured with: Tape Dental Injury: Teeth and Oropharynx as per pre-operative assessment      

## 2013-06-25 NOTE — Op Note (Signed)
     Patient name: Kerri Snyder MRN: [id number] DOB: Oct 10, 1940 Sex: female  06/25/2013 Pre-operative Diagnosis: Symptomatic left carotid stenosis Post-operative diagnosis:  Same Surgeon:  Larina Earthlyodd F. [place] Throckmorton, M.D. Assistants:  Thomasena Edisollins Procedure:    left carotid Endarterectomy with Dacron patch angioplasty Anesthesia:  General Blood Loss:  See anesthesia record Specimens:  Carotid Plaque to pathology  Indications for surgery:  Left brain stroke  Procedure in detail:  The patient was taken to the operating and placed in the supine position. The neck was prepped and draped in the usual sterile fashion. An incision was made anterior to the sternocleidomastoid muscle and continued with electrocautery through the platysma muscle. The muscle was retracted posteriorly and the carotid sheath was opened. The facial vein was ligated with 2-0 silk ties and divided. The common carotid artery was encircled with an umbilical tape and Rummel tourniquet. Dissection was continued onto the carotid bifurcation. The superior thyroid artery was controlled with a 2-0 silk Potts tie. The external carotid organ was encircled with a vessel loop and the internal carotid was encircled with umbilical tape and Rummel tourniquet. The hypoglossal and vagus nerves were identified and preserved.  The patient was given systemic heparinization. After adequate circulation time, the internal,external and common carotid arteries were occluded. The common carotid was opened with an 11 blade and the arteriotomy was continued with Potts scissors onto the internal carotid artery. A 10 shunt was passed up the internal carotid artery, allowed to back bleed, and then passed down the common carotid artery. The shunt was secured with Rummel tourniquet. The endarterectomy was begun on the common carotid artery  plaque was divided proximally with Potts scissors. The endarterectomy was continued onto the carotid bifurcation. The external carotid was  endarterectomized by eversion technique and the internal carotid artery was endarterectomized in an open fashion. Remaining debris was removed from the endarterectomy plane. A Dacron patch was brought to the field and sewn as a patch angioplasty. Prior to completing the anastomosis, the shunt was removed and the usual flushing maneuvers were undertaken. The anastomosis was then completed and flow was restored first to the external and then the internal carotid artery. Excellent flow characteristics were noted with hand-held Doppler in the internal and external carotid arteries.  The patient was given protamine to reverse the heparin. Hemostasis was obtained with electrocautery. The wounds were irrigated with saline. The wound was closed by first reapproximating the sternocleidomastoid muscle over the carotid artery with interrupted 3-0 Vicryl sutures. Next, the platysma was closed with a running 3-0 Vicryl suture. The skin was closed with a 4-0 subcuticular Vicryl suture. Benzoin and Steri-Strips were applied to the incision. A sterile dressing was placed over the incision. All sponge and needle counts were correct. The patient was awakened in the operating room, neurologically intact. They were transferred to the PACU in stable condition.  Carotid stenosis at surgery:80%  Disposition:  To PACU in stable condition,neurologically intact  Relevant Operative Details:  Extended onto proximal CCA  Larina Earthlyodd F. Rose Hippler, M.D. [facility] [place] Office: [phone] Pager:  [phone]

## 2013-06-25 NOTE — Transfer of Care (Signed)
Immediate Anesthesia Transfer of Care Note  Patient: Kerri Snyder  Procedure(s) Performed: Procedure(s): ENDARTERECTOMY CAROTID (Left)  Patient Location: PACU  Anesthesia Type:General  Level of Consciousness: awake, alert  and oriented  Airway & Oxygen Therapy: Patient Spontanous Breathing and Patient connected to nasal cannula oxygen  Post-op Assessment: Report given to PACU RN, Post -op Vital signs reviewed and stable and Patient moving all extremities X 4  Post vital signs: Reviewed and stable  Complications: No apparent anesthesia complications

## 2013-06-25 NOTE — Interval H&P Note (Signed)
History and Physical Interval Note:  06/25/2013 6:45 AM  Kerri Snyder  has presented today for surgery, with the diagnosis of Carotid stenosis  The various methods of treatment have been discussed with the patient and family. After consideration of risks, benefits and other options for treatment, the patient has consented to  Procedure(s): ENDARTERECTOMY CAROTID (Left) as a surgical intervention .  The patient's history has been reviewed, patient examined, no change in status, stable for surgery.  I have reviewed the patient's chart and labs.  Questions were answered to the patient's satisfaction.     Keshia Weare

## 2013-06-25 NOTE — H&P (View-Only) (Signed)
Very pleasant 73 year old admitted with an episode of a fascia. Patient specifically denies any prior neurologic deficits. No prior achalasia events or weakness. She is legally blind lifelong. She reports that her speech is near baseline.  Extensive workup reviewed. This did reveal bilateral moderate-to-severe carotid stenoses by duplex. Subsequent CT angiogram reveals greater than 70% left carotid stenosis which is calcified and irregular and greater than 90% right carotid stenosis which is calcified and very irregular.  MR shows a acute left basal ganglion stroke.  TEE is pending.  Impression and plan: Symptomatic left carotid stenosis and asymptomatic 90% right internal carotid artery stenosis. I have recommended staged bilateral endarterectomies. Discuss this at length with the patient. I recommended left endarterectomy first since this is her symptomatic side and she is left brain dominant. Will await results of TEE. If TEE is normal, would recommend discharge to home and plan readmission next week for left carotid endarterectomy. Will follow with you.

## 2013-06-25 NOTE — H&P (View-Only) (Signed)
[facility] NOTE   MRN : [id number]  Reason for Consult: Kerri Snyder, carotid stenosis Referring Physician: Dr. Flora Lipps  History of Present Illness: 73 y/o female presented with slurred speech 06/14/2013.  She denies other symptoms such as weakness in extremities or difficulty swallowing.   She is legal blind.  Her medical history includes hyperlipidemia. She takes crestor daily.      Current Facility-Administered Medications  Medication Dose Route Frequency Provider Last Rate Last Dose  . acetaminophen (TYLENOL) tablet 650 mg  650 mg Oral Q4H PRN Rodolph Bong, MD       Or  . acetaminophen (TYLENOL) suppository 650 mg  650 mg Rectal Q4H PRN Rodolph Bong, MD      . atorvastatin (LIPITOR) tablet 10 mg  10 mg Oral q1800 Belkys A Regalado, MD      . clopidogrel (PLAVIX) tablet 75 mg  75 mg Oral Q breakfast Layne Benton, NP   75 mg at 06/16/13 0755  . enoxaparin (LOVENOX) injection 40 mg  40 mg Subcutaneous Q24H Zannie Cove, MD   40 mg at 06/15/13 1747  . senna-docusate (Senokot-S) tablet 1 tablet  1 tablet Oral QHS PRN Rodolph Bong, MD        Pt meds include: Statin :Yes Betablocker: No ASA: Yes Other anticoagulants/antiplatelets:   Past Medical History  Diagnosis Date  . Blind   . Hyperlipidemia   . DDD (degenerative disc disease), lumbar   . Sciatica     Past Surgical History  Procedure Laterality Date  . Tubal ligation    . Cesarean section      Social History History  Substance Use Topics  . Smoking status: Never Smoker   . Smokeless tobacco: Not on file  . Alcohol Use: No    Family History Family History  Problem Relation Age of Onset  . Diabetes Father   . Heart disease Father     No Known Allergies   REVIEW OF SYSTEMS  General: [ ]  Weight loss, [ ]  Fever, [ ]  chills Neurologic: [ ]  Dizziness, [ ]  Blackouts, [ ]  Seizure [ ]  Stroke, [ ]  "Mini stroke", [x ] Slurred speech, [ ]  Temporary blindness; [ ]   weakness in arms or legs, [ ]  Hoarseness [ ]  Dysphagia [x]  leaglly blind Cardiac: [ ]  Chest pain/pressure, [ ]  Shortness of breath at rest [ ]  Shortness of breath with exertion, [ ]  Atrial fibrillation or irregular heartbeat  Vascular: [ ]  Pain in legs with walking, [ ]  Pain in legs at rest, [ ]  Pain in legs at night,  [ ]  Non-healing ulcer, [ ]  Blood clot in vein/DVT,   Pulmonary: [ ]  Home oxygen, [ ]  Productive cough, [ ]  Coughing up blood, [ ]  Asthma,  [ ]  Wheezing [ ]  COPD Musculoskeletal:  [ ]  Arthritis, [ ]  Low back pain, [ ]  Joint pain Hematologic: [ ]  Easy Bruising, [ ]  Anemia; [ ]  Hepatitis Gastrointestinal: [ ]  Blood in stool, [ ]  Gastroesophageal Reflux/heartburn, Urinary: [ ]  chronic Kidney disease, [ ]  on HD - [ ]  MWF or [ ]  TTHS, [ ]  Burning with urination, [ ]  Difficulty urinating Skin: [ ]  Rashes, [ ]  Wounds Psychological: [ ]  Anxiety, [ ]  Depression  Physical Examination Filed Vitals:   06/16/13 0004 06/16/13 0415 06/16/13 0818 06/16/13 0900  BP: 122/44 134/62 127/45 99/71  Pulse: 81 74 84   Temp: 97.8 F (36.6 C) 97.8 F (36.6 C) 97.8 F (36.6  C)   TempSrc: Oral Oral Oral   Resp: 18 18 18    Height:      Weight:      SpO2: 97% 95% 96%    Body mass index is 23.35 kg/(m^2).  General:  WDWN in NAD Gait: Normal HENT: WNL Eyes: Pupils equal Pulmonary: normal non-labored breathing , without Rales, rhonchi,  wheezing Cardiac: RRR, without  Murmurs, rubs or gallops; Positive bil. carotid bruits Abdomen: soft, NT, no masses Skin: no rashes, ulcers noted;  no Gangrene , no cellulitis; no open wounds;   Vascular Exam/Pulses:Palpable radial, brachial, femoral, DP/PT pulses intact equal bil.   Musculoskeletal: no muscle wasting or atrophy; no edema  Neurologic: A&O X 3; Appropriate Affect ;  SENSATION: normal; MOTOR FUNCTION: 5/5 Symmetric Speech is fluent/normal   Significant Diagnostic Studies: CBC Lab Results  Component Value Date   WBC 6.2 06/15/2013   HGB  12.7 06/15/2013   HCT 37.8 06/15/2013   MCV 88.5 06/15/2013   PLT 258 06/15/2013    BMET    Component Value Date/Time   NA 144 06/15/2013 0250   K 3.9 06/15/2013 0250   CL 107 06/15/2013 0250   CO2 23 06/15/2013 0250   GLUCOSE 91 06/15/2013 0250   BUN 15 06/15/2013 0250   CREATININE 0.63 06/15/2013 0250   CALCIUM 9.2 06/15/2013 0250   GFRNONAA 86* 06/15/2013 0250   GFRAA >90 06/15/2013 0250   Estimated Creatinine Clearance: 55.5 ml/min (by C-G formula based on Cr of 0.63).  COAG Lab Results  Component Value Date   INR 1.04 06/15/2013   INR 0.93 06/14/2013   INR >10.00* 06/14/2013     Non-Invasive Vascular Imaging:  CTA w/ contrast Right carotid: Calcified plaque in the right common carotid artery  without significant stenosis. Circumferential calcification  involving the proximal right internal carotid artery. There is a  critical stenosis at the upper aspect of this plaque narrowing the  lumen by approximately 90% diameter stenosis. No dissection.  Remainder of the right internal carotid artery is patent to the  skullbase. Atherosclerotic calcification in the cavernous carotid is  noted. 85% diameter stenosis of the proximal right external carotid  artery.  Left carotid: Calcified plaque along the common carotid artery  without significant stenosis. Atherosclerotic plaque in the carotid  bulb narrowing the lumen by approximately 70% diameter stenosis. 70%  diameter stenosis left external carotid artery at the origin.   ASSESSMENT/PLAN:  Carotid artery stenosis  She has narrowing in right > left carotid arteries. She will need carotid endarterectomy surgery in the future  Her stroke was on the left MRI Small, acute left basal ganglia infarct.    Clinton GallantCOLLINS, Allyah [facility]MAUREEN 06/16/2013 3:18 PM

## 2013-06-25 NOTE — Anesthesia Postprocedure Evaluation (Signed)
  Anesthesia Post-op Note  Patient: Lynnette CaffeyDoris J Claudio  Procedure(s) Performed: Procedure(s): ENDARTERECTOMY CAROTID (Left)  Patient Location: PACU  Anesthesia Type:General  Level of Consciousness: awake, oriented, sedated and patient cooperative  Airway and Oxygen Therapy: Patient Spontanous Breathing  Post-op Pain: none  Post-op Assessment: Post-op Vital signs reviewed, Patient's Cardiovascular Status Stable, Respiratory Function Stable, Patent Airway, No signs of Nausea or vomiting and Pain level controlled  Post-op Vital Signs: stable  Complications: No apparent anesthesia complications

## 2013-06-25 NOTE — Preoperative (Signed)
Beta Blockers   Reason not to administer Beta Blockers:Not Applicable 

## 2013-06-25 NOTE — Anesthesia Preprocedure Evaluation (Signed)
Anesthesia Evaluation  Patient identified by MRN, date of birth, ID band Patient awake    Reviewed: Allergy & Precautions, H&P , NPO status , Patient's Chart, lab work & pertinent test results  Airway       Dental   Pulmonary          Cardiovascular + Peripheral Vascular Disease     Neuro/Psych TIA Neuromuscular disease CVA, Residual Symptoms    GI/Hepatic   Endo/Other    Renal/GU      Musculoskeletal   Abdominal   Peds  Hematology   Anesthesia Other Findings   Reproductive/Obstetrics                           Anesthesia Physical Anesthesia Plan  ASA: III  Anesthesia Plan: General   Post-op Pain Management:    Induction: Intravenous  Airway Management Planned: Oral ETT  Additional Equipment: Arterial line  Intra-op Plan:   Post-operative Plan: Extubation in OR  Informed Consent: I have reviewed the patients History and Physical, chart, labs and discussed the procedure including the risks, benefits and alternatives for the proposed anesthesia with the patient or authorized representative who has indicated his/her understanding and acceptance.     Plan Discussed with:   Anesthesia Plan Comments:         Anesthesia Quick Evaluation

## 2013-06-26 LAB — BASIC METABOLIC PANEL
BUN: 8 mg/dL (ref 6–23)
CO2: 23 meq/L (ref 19–32)
CREATININE: 0.62 mg/dL (ref 0.50–1.10)
Calcium: 8.4 mg/dL (ref 8.4–10.5)
Chloride: 105 mEq/L (ref 96–112)
GFR calc Af Amer: >90 mL/min (ref >90)
GFR calc non Af Amer: 87 mL/min — ABNORMAL LOW (ref >90)
Glucose, Bld: 95 mg/dL (ref 70–99)
Potassium: 4 mEq/L (ref 3.7–5.3)
Sodium: 140 mEq/L (ref 137–147)

## 2013-06-26 LAB — CBC
HEMATOCRIT: 33.4 % — AB (ref 36.0–46.0)
HEMOGLOBIN: 11.3 g/dL — AB (ref 12.0–15.0)
MCH: 29.8 pg (ref 26.0–34.0)
MCHC: 33.8 g/dL (ref 30.0–36.0)
MCV: 88.1 fL (ref 78.0–100.0)
PLATELETS: 185 10*3/uL (ref 150–400)
RBC: 3.79 MIL/uL — AB (ref 3.87–5.11)
RDW: 13.1 % (ref 11.5–15.5)
WBC: 7.8 10*3/uL (ref 4.0–10.5)

## 2013-06-26 NOTE — Discharge Instructions (Signed)
Carotid Endarterectomy, Care After Refer to this sheet in the next few weeks. These instructions provide you with information on caring for yourself after your procedure. Your health care provider may also give you more specific instructions. Your treatment has been planned according to current medical practices, but problems sometimes occur. Call your health care provider if you have any problems or questions after your procedure.  WHAT TO EXPECT AFTER THE PROCEDURE You may have some pain or an ache in your neck for up to 2 weeks. This is normal. Recovery time varies depending on your age, condition, general health, and other factors. You will likely be able to return to a normal lifestyle within a few weeks.  HOME CARE INSTRUCTIONS   Take showers if your health care provider approves. Do not take tub baths or go swimming until your health care provider says it is okay.   Only take over-the-counter or prescription medicines as directed by your health care provider. If a blood thinner (anticoagulant) is prescribed after surgery, take this medicine exactly as directed.   Change bandages (dressings) as directed by your health care provider.   Avoid heavy lifting or strenuous activity until your health care provider says it is okay. Resume your normal activities as directed.   Stop smoking if you smoke. This is a risk factor for poor wound healing.   Stop taking the pill (oral contraceptives) unless your health care provider recommends otherwise.   Maintain good control of your blood pressure.   Exercise regularly or as instructed by your health care provider.   Eat a heart-healthy diet. Talk to your health care provider about how to lower blood lipids (cholesterol and triglycerides).   Follow up with your health care provider as directed. Make an appointment for the removal of stitches (sutures) or staples. SEEK MEDICAL CARE IF:   You have increased bleeding from the incision site.    You notice redness, swelling, or increasing pain at the incision site.   You notice swelling in your neck or have difficulty breathing or talking.   You notice a bad smell or pus coming from the incision site or dressing.   You have an oral temperature above 101F (38.3C).   You develop a rash.   You develop any reaction or side effects to medicine given.  SEEK IMMEDIATE MEDICAL CARE IF:   Your initial symptoms are getting worse rather than better.   You develop any abnormal bruising or bleeding.   You have difficulty breathing.   You develop chest pain, shortness of breath, or pain or swelling in your legs.   You have a return of symptoms or problems that caused you to have this surgery.   You develop a temporary loss of vision.   You develop temporary numbness on one side.   You develop a temporary inability to speak (aphasia).   You develop temporary weakness.  MAKE SURE YOU:   Understand these instructions.  Will watch your condition.  Will get help right away if you are not doing well or get worse. Document Released: 10/19/2004 Document Revised: 12/02/2012 Document Reviewed: 09/02/2012 [facility] Patient Information 2014 [place], [place].  Carotid Artery Disease  The carotid arteries are arteries on both sides of the neck. They carry blood to the brain. Carotid artery disease is when the arteries get smaller (narrow) or get blocked. If these arteries get smaller or get blocked, you are more likely to have a stroke or warning stroke (transient ischemic attack).  HOME CARE  Take medicines as told by your doctor. Make sure you understand all your medicine instructions. Do not stop your medicines without talking to your doctor first.  Follow your doctor's diet instructions. It is important to eat a healthy diet that includes plenty of:  Fresh fruits.  Vegetables.  Lean meats.  Avoid:  High-fat foods.  High-sodium foods.  Foods that are  fried, overly processed, or have poor nutritional value.  Stay a healthy weight.  Stay active. Get at least 30 minutes of activity every day.  Do not smoke.  Limit alcohol use to:  No more than 2 drinks a day for men.  No more than 1 drink a day for women who are not pregnant.  Do not use illegal drugs.  Keep all doctor visits as told. GET HELP RIGHT AWAY IF:   You have sudden weakness or loss of feeling (numbness) on one side of the body, such as the face, arm, or leg.  You have sudden confusion.  You have trouble speaking (aphasia) or understanding.  You have sudden trouble seeing out of one or both eyes.  You have sudden trouble walking.  You have dizziness or feel like you might pass out (faint).  You have a loss of balance or your movements are not steady (uncoordinated).  You have a sudden, severe headache with no known cause.  You have trouble swallowing (dysphagia). Call your local emergency services (911 in [place] Do notdrive yourself to the clinic or hospital.  Document Released: 03/18/2012 Document Revised: 12/02/2012 Document Reviewed: 09/30/2012 [facility] Patient Information 2014 [place]

## 2013-06-26 NOTE — Progress Notes (Signed)
Kerri Caffeyoris Kerri Snyder to be D/C'd Home per MD order.  Discussed with the patient and all questions fully answered.    Medication List         aspirin 81 MG tablet  Take 81 mg by mouth daily.     B-12 PO  Take 1 tablet by mouth every morning.     clopidogrel 75 MG tablet  Commonly known as:  PLAVIX  Take 1 tablet (75 mg total) by mouth daily with breakfast.     oxyCODONE-acetaminophen 5-325 MG per tablet  Commonly known as:  PERCOCET/ROXICET  Take 1 tablet by mouth every 6 (six) hours as needed.     rosuvastatin 10 MG tablet  Commonly known as:  CRESTOR  Take 10 mg by mouth at bedtime.     VITAMIN C PO  Take 1 tablet by mouth every morning.     VITAMIN D PO  Take 1 tablet by mouth every morning.        VVS, Skin clean, dry and intact without evidence of skin break down, no evidence of skin tears noted. IV catheter discontinued intact. Site without signs and symptoms of complications. Dressing and pressure applied.  An After Visit Summary was printed and given to the patient.  D/c education completed with patient/family including follow up instructions, medication list, d/c activities limitations if indicated, with other d/c instructions as indicated by MD - patient able to verbalize understanding, all questions fully answered.   Patient instructed to return to ED, call 911, or call MD for any changes in condition.   Patient escorted via WC, and D/C home via private auto.  Osvaldo HumanSchiller, [name] [facility] Center[facility]ande 06/26/2013 11:19 AM

## 2013-06-26 NOTE — Progress Notes (Signed)
    Subjective  - POD #1, s/p L CEA  No complaints this am Tolerated breakfast without swallowing difficulty Ambulating  Voiding spontaneously   Physical Exam:  L CEA incision clean without edema Neuro intact Tongue midline Smile symetric       Assessment/Plan:  POD #1  OK for d/c home today F/u 2 weeks TFE  BRABHAM IV, V. WELLS 06/26/2013 9:16 AM --  Filed Vitals:   06/26/13 0726  BP: 145/64  Pulse: 89  Temp: 98.5 F (36.9 C)  Resp: 16    Intake/Output Summary (Last 24 hours) at 06/26/13 0916 Last data filed at 06/26/13 0800  Gross per 24 hour  Intake 4120.83 ml  Output   2100 ml  Net 2020.83 ml     Laboratory CBC    Component Value Date/Time   WBC 7.8 06/26/2013 0600   HGB 11.3* 06/26/2013 0600   HCT 33.4* 06/26/2013 0600   PLT 185 06/26/2013 0600    BMET    Component Value Date/Time   NA 140 06/26/2013 0600   K 4.0 06/26/2013 0600   CL 105 06/26/2013 0600   CO2 23 06/26/2013 0600   GLUCOSE 95 06/26/2013 0600   BUN 8 06/26/2013 0600   CREATININE 0.62 06/26/2013 0600   CALCIUM 8.4 06/26/2013 0600   GFRNONAA 87* 06/26/2013 0600   GFRAA >90 06/26/2013 0600    COAG Lab Results  Component Value Date   INR 0.93 06/24/2013   INR 1.04 06/15/2013   INR 0.93 06/14/2013   No results found for this basename: PTT    Antibiotics Anti-infectives   Start     Dose/Rate Route Frequency Ordered Stop   06/25/13 2000  cefUROXime (ZINACEF) 1.5 g in dextrose 5 % 50 mL IVPB     1.5 g 100 mL/hr over 30 Minutes Intravenous Every 12 hours 06/25/13 1701 06/26/13 0827   06/24/13 1307  cefUROXime (ZINACEF) 1.5 g in dextrose 5 % 50 mL IVPB     1.5 g 100 mL/hr over 30 Minutes Intravenous 30 min pre-op 06/24/13 1307 06/25/13 0815       V. Charlena CrossWells Brabham IV, M.D. [facility] [place] Office: [phone] Pager:  [phone]

## 2013-06-28 ENCOUNTER — Encounter: Payer: Self-pay | Admitting: Surgery

## 2013-06-28 ENCOUNTER — Telehealth: Payer: Self-pay | Admitting: Vascular Surgery

## 2013-06-28 NOTE — Discharge Summary (Signed)
[facility] Discharge Summary   Patient ID:  Kerri Snyder MRN: [id number] DOB/AGE: 07-15-1940 73 y.o.  Admit date: 06/25/2013 Discharge date: 06/28/2013 Date of Surgery: 06/25/2013 Surgeon: Surgeon(s): Larina Earthly, MD  Admission Diagnosis: Carotid stenosis  Discharge Diagnoses:  Carotid stenosis  Secondary Diagnoses: Past Medical History  Diagnosis Date  . Blind   . Hyperlipidemia   . DDD (degenerative disc disease), lumbar   . Sciatica   . Family history of anesthesia complication     cousin with nausea and vomiting post anesthesia    Procedure(s): ENDARTERECTOMY CAROTID  Discharged Condition: good  HPI: 73 y/o female presented with slurred speech 06/14/2013. She denies other symptoms such as weakness in extremities or difficulty swallowing. She is legal blind. Her medical history includes hyperlipidemia. She takes crestor daily.  MR shows a acute left basal ganglion stroke.  She was diagnosed with Symptomatic left carotid stenosis and asymptomatic 90% right internal carotid artery stenosis. I have recommended staged bilateral endarterectomies. Discuss this at length with the patient. I recommended left endarterectomy first since this is her symptomatic side and she is left brain dominant. Will await results of TEE. If TEE is normal, would recommend discharge to home and plan readmission next week for left carotid endarterectomy. Will follow with you.    She returned to [facility] on 06/25/2013 and underwent left carotid Endarterectomy with Dacron patch angioplasty.  Post-op day 1 she had no complaints.  Ambulating, taking PO's well and voided.  She was discharged home and will f/u with Dr. Arbie Cookey in 2 weekes once she has healed we will plan the right carotid surgery.     Hospital Course:  Kerri Snyder is a 73 y.o. female is S/P Left Procedure(s): ENDARTERECTOMY CAROTID Extubated: POD # 0 Physical exam: L CEA incision clean without edema  Neuro intact   Tongue midline  Smile symetric  Post-op wounds healing well Pt. Ambulating, voiding and taking PO diet without difficulty. Pt pain controlled with PO pain meds. Labs as below Complications:none  Consults:     Significant Diagnostic Studies: CBC Lab Results  Component Value Date   WBC 7.8 06/26/2013   HGB 11.3* 06/26/2013   HCT 33.4* 06/26/2013   MCV 88.1 06/26/2013   PLT 185 06/26/2013    BMET    Component Value Date/Time   NA 140 06/26/2013 0600   K 4.0 06/26/2013 0600   CL 105 06/26/2013 0600   CO2 23 06/26/2013 0600   GLUCOSE 95 06/26/2013 0600   BUN 8 06/26/2013 0600   CREATININE 0.62 06/26/2013 0600   CALCIUM 8.4 06/26/2013 0600   GFRNONAA 87* 06/26/2013 0600   GFRAA >90 06/26/2013 0600   COAG Lab Results  Component Value Date   INR 0.93 06/24/2013   INR 1.04 06/15/2013   INR 0.93 06/14/2013     Disposition:  Discharge to :Home Discharge Orders   Future Appointments Provider Department Dept Phone   07/13/2013 4:00 PM Larina Earthly, MD [facility] [phone]   Future Orders Complete By Expires   Call MD for:  redness, tenderness, or signs of infection (pain, swelling, bleeding, redness, odor or green/yellow discharge around incision site)  As directed    Call MD for:  severe or increased pain, loss or decreased feeling  in affected limb(s)  As directed    Call MD for:  temperature >100.5  As directed    Discharge instructions  As directed    Comments:  May shower in 24 hours.   Driving Restrictions  As directed    Comments:     No driving for 2 weeks   Increase activity slowly  As directed    Comments:     Walk with assistance use walker or cane as needed   Lifting restrictions  As directed    Comments:     No lifting for 6 weeks   Resume previous diet  As directed        Medication List         aspirin 81 MG tablet  Take 81 mg by mouth daily.     B-12 PO  Take 1 tablet by mouth every morning.     clopidogrel 75 MG  tablet  Commonly known as:  PLAVIX  Take 1 tablet (75 mg total) by mouth daily with breakfast.     oxyCODONE-acetaminophen 5-325 MG per tablet  Commonly known as:  PERCOCET/ROXICET  Take 1 tablet by mouth every 6 (six) hours as needed.     rosuvastatin 10 MG tablet  Commonly known as:  CRESTOR  Take 10 mg by mouth at bedtime.     VITAMIN C PO  Take 1 tablet by mouth every morning.     VITAMIN D PO  Take 1 tablet by mouth every morning.       Verbal and written Discharge instructions given to the patient. Wound care per Discharge AVS     Follow-up Information   Follow up with EARLY, TODD, MD In 2 weeks. (office will call you to arrange your appt (sent))    Specialty:  Vascular Surgery   Contact information:   [address] [place] [address] [phone]       Signed: Clinton GallantCOLLINS, Andrika Peraza [facility]EN 06/28/2013, 1:42 PM  --- For [facility] Center[facility] Registry use --- Instructions: Press F2 to tab through selections.  Delete question if not applicable.   Modified Rankin score at D/C (0-6): Rankin Score=0  IV medication needed for:  1. Hypertension: No 2. Hypotension: No  Post-op Complications: No  1. Post-op CVA or TIA: No  If yes: Event classification (right eye, left eye, right cortical, left cortical, verterobasilar, other):   If yes: Timing of event (intra-op, <6 hrs post-op, >=6 hrs post-op, unknown):   2. CN injury: No  If yes: CN  injuried   3. Myocardial infarction: No  If yes: Dx by (EKG or clinical, Troponin):   4.  CHF: No  5.  Dysrhythmia (new): No  6. Wound infection: No  7. Reperfusion symptoms: No  8. Return to OR: No  If yes: return to OR for (bleeding, neurologic, other CEA incision, other):   Discharge medications: Statin use:  Yes ASA use:  Yes Beta blocker use:  No  for medical reason   ACE-Inhibitor use:  No  for medical reason   P2Y12 Antagonist use: [ ]  None, [x ] Plavix, [ ]  Plasugrel, [ ]  Ticlopinine, [ ]  Ticagrelor, [ ]  Other, [ ]  No for  medical reason, [ ]  Non-compliant, [ ]  Not-indicated Anti-coagulant use:  [ ]  None, [ ]  Warfarin, [ ]  Rivaroxaban, [ ]  Dabigatran, [ ]  Other, [ ]  No for medical reason, [ ]  Non-compliant, [ ]  Not-indicated

## 2013-06-28 NOTE — Telephone Encounter (Addendum)
Message copied by Fredrich BirksMILLIKAN, DANA P on Mon Jun 28, 2013  2:46 PM ------      Message from: Phillips OdorPULLINS, CAROL S      Created: Mon Jun 28, 2013  9:50 AM      Regarding: Joyce GrossKay log                   ----- Message -----         From: Dara LordsSamantha J Rhyne, PA-C         Sent: 06/25/2013   7:13 PM           To: Vvs Charge Pool            Left cea 06/25/13. Fu with dr. Arbie CookeyEarly in 2 weeks ------  06/28/13: spoke with patient re appt, dpm

## 2013-06-29 ENCOUNTER — Encounter (HOSPITAL_COMMUNITY): Payer: Self-pay | Admitting: Vascular Surgery

## 2013-06-29 NOTE — Telephone Encounter (Signed)
Patient has already went back to work.  Patient's son was informed of the message.

## 2013-07-12 ENCOUNTER — Encounter: Payer: Self-pay | Admitting: Vascular Surgery

## 2013-07-13 ENCOUNTER — Ambulatory Visit (INDEPENDENT_AMBULATORY_CARE_PROVIDER_SITE_OTHER): Payer: Self-pay | Admitting: Vascular Surgery

## 2013-07-13 ENCOUNTER — Encounter: Payer: Self-pay | Admitting: Vascular Surgery

## 2013-07-13 VITALS — BP 180/86 | HR 83 | Resp 18 | Ht 65.0 in | Wt 140.9 lb

## 2013-07-13 DIAGNOSIS — I6529 Occlusion and stenosis of unspecified carotid artery: Secondary | ICD-10-CM | POA: Insufficient documentation

## 2013-07-13 NOTE — Progress Notes (Signed)
Here today for followup of her left carotid endarterectomy for symptomatic carotid disease. She had preoperative aphasia and brief hospitalization. She was discharged home and readmitted for left carotid surgery. She did quite well and was discharged home on postoperative day 1. She is returned to her baseline preoperative status with no further A. aphasia. She reports no discomfort regarding her surgery and surgical incision. She does have the usual amount of peri-incisional numbness  On physical exam she has no carotid bruits. Her left neck incision is well-healed with the usual healing ridge.  Impression and plan stable status post endarterectomy for symptomatic carotid disease in the left. Her CT angiogram showed critical stenosis greater than 90% and her right carotid. She wishes to proceed with correction of this as well. We will schedule this on Friday at her request on 08/06/13 for admission the surgery. She understands the procedure and 1-2% risk of stroke with surgery.  She is allowed to return to work without limitations prior to 

## 2013-07-22 ENCOUNTER — Other Ambulatory Visit: Payer: Self-pay | Admitting: *Deleted

## 2013-07-29 NOTE — Pre-Procedure Instructions (Addendum)
Kerri CaffeyDoris J Snyder  07/29/2013   Your procedure is scheduled on:  April 24th Friday   [facility]  2 * 3 at  5:30 AM.   Call this number if you have problems the morning of surgery: [phone]   Remember:   Do not eat food or drink liquids after midnight Thursday.   Take these medicines the morning of surgery with A SIP OF WATER: Oxycodone (if needed)        Take all reg medications until day of surgery except as instructed below or per dr  Despina AriasSTOP all herbel meds, nsaids (aleve,naproxen,advil,ibuprofen) 5 days prior to surgery including vitamins         plavix per dr(last dose 08/02/13 per dr)   Drucilla Schmidto not wear jewelry, make-up or nail polish.  Do not wear lotions, powders, or perfumes. You may NOT wear deodorant.  Do not shave underarms & legs 48 hours prior to surgery.    Do not bring valuables to the hospital.  [facility] is not [facility] for any belongings or valuables.               Contacts, dentures or bridgework may not be worn into surgery.  Leave suitcase in the car. After surgery it may be brought to your room.  For patients admitted to the hospital, discharge time is determined by your treatment team.    Name and phone number of your driver:    Special Instructions:  Special Instructions: [place] - Preparing for Surgery  Before surgery, you can play an important role.  Because skin is not sterile, your skin needs to be as free of germs as possible.  You can reduce the number of germs on you skin by washing with CHG (chlorahexidine gluconate) soap before surgery.  CHG is an antiseptic cleaner which kills germs and bonds with the skin to continue killing germs even after washing.  Please DO NOT use if you have an allergy to CHG or antibacterial soaps.  If your skin becomes reddened/irritated stop using the CHG and inform your nurse when you arrive at Short Stay.  Do not shave (including legs and underarms) for at least 48 hours prior to  the first CHG shower.  You may shave your face.  Please follow these instructions carefully:   1.  Shower with CHG Soap the night before surgery and the morning of Surgery.  2.  If you choose to wash your hair, wash your hair first as usual with your normal shampoo.  3.  After you shampoo, rinse your hair and body thoroughly to remove the Shampoo.  4.  Use CHG as you would any other liquid soap.  You can apply chg directly  to the skin and wash gently with scrungie or a clean washcloth.  5.  Apply the CHG Soap to your body ONLY FROM THE NECK DOWN.  Do not use on open wounds or open sores.  Avoid contact with your eyes ears, mouth and genitals (private parts).  Wash genitals (private parts)       with your normal soap.  6.  Wash thoroughly, paying special attention to the area where your surgery will be performed.  7.  Thoroughly rinse your body with warm water from the neck down.  8.  DO NOT shower/wash with your normal soap after using and rinsing off the CHG Soap.  9.  Pat yourself dry with a clean towel.  10.  Wear clean pajamas.            11.  Place clean sheets on your bed the night of your first shower and do not sleep with pets.  Day of Surgery  Do not apply any lotions/deodorants the morning of surgery.  Please wear clean clothes to the hospital/surgery center.   Please read over the following fact sheets that you were given: Pain Booklet, Coughing and Deep Breathing, Blood Transfusion Information, MRSA Information and Surgical Site Infection Prevention

## 2013-07-30 ENCOUNTER — Encounter (HOSPITAL_COMMUNITY)
Admission: RE | Admit: 2013-07-30 | Discharge: 2013-07-30 | Disposition: A | Discharge status: Home / Self Care | Payer: Medicare Other | Source: Ambulatory Visit | Attending: Vascular Surgery | Admitting: Vascular Surgery

## 2013-07-30 ENCOUNTER — Encounter (HOSPITAL_COMMUNITY): Payer: Self-pay

## 2013-07-30 DIAGNOSIS — Z01812 Encounter for preprocedural laboratory examination: Secondary | ICD-10-CM | POA: Insufficient documentation

## 2013-07-30 HISTORY — DX: Cerebral infarction, unspecified: I63.9

## 2013-07-30 LAB — COMPREHENSIVE METABOLIC PANEL WITH GFR
ALT: 18 U/L (ref 0–35)
AST: 21 U/L (ref 0–37)
Albumin: 4.1 g/dL (ref 3.5–5.2)
Alkaline Phosphatase: 102 U/L (ref 39–117)
BUN: 22 mg/dL (ref 6–23)
CO2: 26 meq/L (ref 19–32)
Calcium: 10 mg/dL (ref 8.4–10.5)
Chloride: 105 meq/L (ref 96–112)
Creatinine, Ser: 0.69 mg/dL (ref 0.50–1.10)
GFR calc Af Amer: >90 mL/min
GFR calc non Af Amer: 84 mL/min — ABNORMAL LOW
Glucose, Bld: 93 mg/dL (ref 70–99)
Potassium: 4.3 meq/L (ref 3.7–5.3)
Sodium: 144 meq/L (ref 137–147)
Total Bilirubin: 0.2 mg/dL — ABNORMAL LOW (ref 0.3–1.2)
Total Protein: 7.1 g/dL (ref 6.0–8.3)

## 2013-07-30 LAB — URINALYSIS, ROUTINE W REFLEX MICROSCOPIC
Bilirubin Urine: NEGATIVE
Glucose, UA: NEGATIVE mg/dL
Hgb urine dipstick: NEGATIVE
Ketones, ur: NEGATIVE mg/dL
Leukocytes, UA: NEGATIVE
Nitrite: NEGATIVE
Protein, ur: NEGATIVE mg/dL
Specific Gravity, Urine: 1.024 (ref 1.005–1.030)
Urobilinogen, UA: 0.2 mg/dL (ref 0.0–1.0)
pH: 6 (ref 5.0–8.0)

## 2013-07-30 LAB — TYPE AND SCREEN
ABO/RH(D): O NEG
Antibody Screen: NEGATIVE

## 2013-07-30 LAB — CBC
HCT: 38.9 % (ref 36.0–46.0)
HEMOGLOBIN: 13 g/dL (ref 12.0–15.0)
MCH: 29.7 pg (ref 26.0–34.0)
MCHC: 33.4 g/dL (ref 30.0–36.0)
MCV: 88.8 fL (ref 78.0–100.0)
Platelets: 222 10*3/uL (ref 150–400)
RBC: 4.38 MIL/uL (ref 3.87–5.11)
RDW: 13.2 % (ref 11.5–15.5)
WBC: 5.8 10*3/uL (ref 4.0–10.5)

## 2013-07-30 LAB — SURGICAL PCR SCREEN
MRSA, PCR: NEGATIVE
STAPHYLOCOCCUS AUREUS: NEGATIVE

## 2013-07-30 LAB — PROTIME-INR
INR: 0.9 (ref 0.00–1.49)
Prothrombin Time: 12 seconds (ref 11.6–15.2)

## 2013-07-30 LAB — APTT: aPTT: 29 s (ref 24–37)

## 2013-08-04 ENCOUNTER — Encounter (HOSPITAL_COMMUNITY): Payer: Self-pay

## 2013-08-05 MED ORDER — DEXTROSE 5 % IV SOLN
1.5000 g | INTRAVENOUS | Status: AC
  Administered 2013-08-06: 1.5 g via INTRAVENOUS
  Filled 2013-08-05: qty 1.5

## 2013-08-06 ENCOUNTER — Inpatient Hospital Stay (HOSPITAL_COMMUNITY): Payer: Medicare Other | Admitting: Certified Registered Nurse Anesthetist

## 2013-08-06 ENCOUNTER — Telehealth: Payer: Self-pay | Admitting: Vascular Surgery

## 2013-08-06 ENCOUNTER — Encounter (HOSPITAL_COMMUNITY): Payer: Self-pay | Admitting: Certified Registered Nurse Anesthetist

## 2013-08-06 ENCOUNTER — Encounter (HOSPITAL_COMMUNITY): Payer: Medicare Other | Admitting: Certified Registered Nurse Anesthetist

## 2013-08-06 ENCOUNTER — Inpatient Hospital Stay (HOSPITAL_COMMUNITY)
Admission: RE | Admit: 2013-08-06 | Discharge: 2013-08-07 | DRG: 039 | Disposition: A | Discharge status: Home / Self Care | Payer: Medicare Other | Source: Ambulatory Visit | Attending: Vascular Surgery | Admitting: Vascular Surgery

## 2013-08-06 ENCOUNTER — Encounter (HOSPITAL_COMMUNITY)
Admission: RE | Disposition: A | Discharge status: Home / Self Care | Payer: Self-pay | Source: Ambulatory Visit | Attending: Vascular Surgery

## 2013-08-06 DIAGNOSIS — Z7902 Long term (current) use of antithrombotics/antiplatelets: Secondary | ICD-10-CM

## 2013-08-06 DIAGNOSIS — Z7982 Long term (current) use of aspirin: Secondary | ICD-10-CM

## 2013-08-06 DIAGNOSIS — Z79899 Other long term (current) drug therapy: Secondary | ICD-10-CM

## 2013-08-06 DIAGNOSIS — I6529 Occlusion and stenosis of unspecified carotid artery: Principal | ICD-10-CM | POA: Diagnosis present

## 2013-08-06 DIAGNOSIS — Z8673 Personal history of transient ischemic attack (TIA), and cerebral infarction without residual deficits: Secondary | ICD-10-CM

## 2013-08-06 DIAGNOSIS — H548 Legal blindness, as defined in USA: Secondary | ICD-10-CM | POA: Diagnosis present

## 2013-08-06 DIAGNOSIS — E785 Hyperlipidemia, unspecified: Secondary | ICD-10-CM | POA: Diagnosis present

## 2013-08-06 HISTORY — PX: CAROTID ENDARTERECTOMY: SUR193

## 2013-08-06 HISTORY — PX: ENDARTERECTOMY: SHX5162

## 2013-08-06 SURGERY — ENDARTERECTOMY, CAROTID
Anesthesia: General | Site: Neck | Laterality: Right

## 2013-08-06 MED ORDER — WHITE PETROLATUM GEL
Status: AC
  Filled 2013-08-06: qty 5

## 2013-08-06 MED ORDER — HEPARIN SODIUM (PORCINE) 5000 UNIT/ML IJ SOLN
INTRAMUSCULAR | Status: DC | PRN
Start: 2013-08-06 — End: 2013-08-06
  Administered 2013-08-06: 07:00:00

## 2013-08-06 MED ORDER — ALUM & MAG HYDROXIDE-SIMETH 200-200-20 MG/5ML PO SUSP: 15.0000 mL | ORAL | Status: DC | PRN

## 2013-08-06 MED ORDER — MAGNESIUM SULFATE 40 MG/ML IJ SOLN: 2.0000 g | Freq: Every day | INTRAMUSCULAR | Status: DC | PRN

## 2013-08-06 MED ORDER — HYDROMORPHONE HCL PF 1 MG/ML IJ SOLN
0.2500 mg | INTRAMUSCULAR | Status: DC | PRN
  Administered 2013-08-06 (×2): 0.5 mg via INTRAVENOUS

## 2013-08-06 MED ORDER — POTASSIUM CHLORIDE CRYS ER 20 MEQ PO TBCR: 20.0000 meq | EXTENDED_RELEASE_TABLET | Freq: Every day | ORAL | Status: DC | PRN

## 2013-08-06 MED ORDER — FENTANYL CITRATE 0.05 MG/ML IJ SOLN
INTRAMUSCULAR | Status: DC | PRN
  Administered 2013-08-06 (×2): 100 ug via INTRAVENOUS

## 2013-08-06 MED ORDER — PROTAMINE SULFATE 10 MG/ML IV SOLN
INTRAVENOUS | Status: DC | PRN
  Administered 2013-08-06 (×3): 10 mg via INTRAVENOUS
  Administered 2013-08-06: 20 mg via INTRAVENOUS

## 2013-08-06 MED ORDER — MIDAZOLAM HCL 5 MG/5ML IJ SOLN
INTRAMUSCULAR | Status: DC | PRN
  Administered 2013-08-06: 2 mg via INTRAVENOUS

## 2013-08-06 MED ORDER — METOPROLOL TARTRATE 1 MG/ML IV SOLN: 2.0000 mg | INTRAVENOUS | Status: DC | PRN

## 2013-08-06 MED ORDER — PANTOPRAZOLE SODIUM 40 MG PO TBEC
40.0000 mg | DELAYED_RELEASE_TABLET | Freq: Every day | ORAL | Status: DC
Start: 2013-08-06 — End: 2013-08-07
  Administered 2013-08-06 – 2013-08-07 (×2): 40 mg via ORAL
  Filled 2013-08-06 (×2): qty 1

## 2013-08-06 MED ORDER — OXYCODONE HCL 5 MG PO TABS: 5.0000 mg | ORAL_TABLET | Freq: Once | ORAL | Status: DC | PRN

## 2013-08-06 MED ORDER — CHLORHEXIDINE GLUCONATE 4 % EX LIQD
60.0000 mL | Freq: Once | CUTANEOUS | Status: DC
  Filled 2013-08-06: qty 60

## 2013-08-06 MED ORDER — OXYCODONE-ACETAMINOPHEN 5-325 MG PO TABS
1.0000 | ORAL_TABLET | ORAL | Status: DC | PRN
  Administered 2013-08-06: 2 via ORAL

## 2013-08-06 MED ORDER — ONDANSETRON HCL 4 MG/2ML IJ SOLN: 4.0000 mg | Freq: Once | INTRAMUSCULAR | Status: DC | PRN

## 2013-08-06 MED ORDER — EPHEDRINE SULFATE 50 MG/ML IJ SOLN
INTRAMUSCULAR | Status: DC | PRN
  Administered 2013-08-06: 10 mg via INTRAVENOUS
  Administered 2013-08-06: 5 mg via INTRAVENOUS

## 2013-08-06 MED ORDER — PNEUMOCOCCAL VAC POLYVALENT 25 MCG/0.5ML IJ INJ
0.5000 mL | INJECTION | INTRAMUSCULAR | Status: AC
  Administered 2013-08-07: 0.5 mL via INTRAMUSCULAR
  Filled 2013-08-06: qty 0.5

## 2013-08-06 MED ORDER — GUAIFENESIN-DM 100-10 MG/5ML PO SYRP: 15.0000 mL | ORAL_SOLUTION | ORAL | Status: DC | PRN

## 2013-08-06 MED ORDER — SENNOSIDES-DOCUSATE SODIUM 8.6-50 MG PO TABS
1.0000 | ORAL_TABLET | Freq: Every evening | ORAL | Status: DC | PRN
  Filled 2013-08-06: qty 1

## 2013-08-06 MED ORDER — ONDANSETRON HCL 4 MG/2ML IJ SOLN
INTRAMUSCULAR | Status: AC
Start: 2013-08-06 — End: 2013-08-06
  Filled 2013-08-06: qty 2

## 2013-08-06 MED ORDER — PHENYLEPHRINE HCL 10 MG/ML IJ SOLN
INTRAMUSCULAR | Status: DC | PRN
  Administered 2013-08-06: 80 ug via INTRAVENOUS

## 2013-08-06 MED ORDER — ONDANSETRON HCL 4 MG/2ML IJ SOLN
INTRAMUSCULAR | Status: DC | PRN
  Administered 2013-08-06: 4 mg via INTRAVENOUS

## 2013-08-06 MED ORDER — STERILE WATER FOR INJECTION IJ SOLN
INTRAMUSCULAR | Status: AC
  Filled 2013-08-06: qty 20

## 2013-08-06 MED ORDER — DEXAMETHASONE SODIUM PHOSPHATE 10 MG/ML IJ SOLN
INTRAMUSCULAR | Status: DC | PRN
  Administered 2013-08-06: 4 mg via INTRAVENOUS

## 2013-08-06 MED ORDER — NEOSTIGMINE METHYLSULFATE 1 MG/ML IJ SOLN
INTRAMUSCULAR | Status: AC
  Filled 2013-08-06: qty 10

## 2013-08-06 MED ORDER — LACTATED RINGERS IV SOLN
INTRAVENOUS | Status: DC | PRN
  Administered 2013-08-06 (×2): via INTRAVENOUS

## 2013-08-06 MED ORDER — GLYCOPYRROLATE 0.2 MG/ML IJ SOLN
INTRAMUSCULAR | Status: DC | PRN
  Administered 2013-08-06: .8 mg via INTRAVENOUS

## 2013-08-06 MED ORDER — DEXTROSE 5 % IV SOLN
1.5000 g | Freq: Two times a day (BID) | INTRAVENOUS | Status: AC
  Administered 2013-08-06 – 2013-08-07 (×2): 1.5 g via INTRAVENOUS
  Filled 2013-08-06 (×2): qty 1.5

## 2013-08-06 MED ORDER — SODIUM CHLORIDE 0.9 % IV SOLN
INTRAVENOUS | Status: DC
Start: 2013-08-06 — End: 2013-08-06

## 2013-08-06 MED ORDER — PHENYLEPHRINE 40 MCG/ML (10ML) SYRINGE FOR IV PUSH (FOR BLOOD PRESSURE SUPPORT)
PREFILLED_SYRINGE | INTRAVENOUS | Status: AC
  Filled 2013-08-06: qty 10

## 2013-08-06 MED ORDER — PHENYLEPHRINE HCL 10 MG/ML IJ SOLN
10.0000 mg | INTRAMUSCULAR | Status: DC | PRN
  Administered 2013-08-06: 20 ug/min via INTRAVENOUS

## 2013-08-06 MED ORDER — CLOPIDOGREL BISULFATE 75 MG PO TABS
75.0000 mg | ORAL_TABLET | Freq: Every day | ORAL | Status: DC
  Administered 2013-08-07: 75 mg via ORAL
  Filled 2013-08-06 (×2): qty 1

## 2013-08-06 MED ORDER — HYDROMORPHONE HCL PF 1 MG/ML IJ SOLN
INTRAMUSCULAR | Status: AC
  Administered 2013-08-06: 0.5 mg via INTRAVENOUS
  Filled 2013-08-06: qty 1

## 2013-08-06 MED ORDER — MEPERIDINE HCL 25 MG/ML IJ SOLN: 6.2500 mg | INTRAMUSCULAR | Status: DC | PRN

## 2013-08-06 MED ORDER — OXYCODONE HCL 5 MG/5ML PO SOLN: 5.0000 mg | Freq: Once | ORAL | Status: DC | PRN

## 2013-08-06 MED ORDER — LIDOCAINE HCL (CARDIAC) 20 MG/ML IV SOLN
INTRAVENOUS | Status: DC | PRN
  Administered 2013-08-06: 80 mg via INTRAVENOUS

## 2013-08-06 MED ORDER — NEOSTIGMINE METHYLSULFATE 1 MG/ML IJ SOLN
INTRAMUSCULAR | Status: DC | PRN
  Administered 2013-08-06: 4 mg via INTRAVENOUS

## 2013-08-06 MED ORDER — OXYCODONE-ACETAMINOPHEN 5-325 MG PO TABS: 1.0000 | ORAL_TABLET | Freq: Four times a day (QID) | ORAL | Status: DC | PRN

## 2013-08-06 MED ORDER — BISACODYL 10 MG RE SUPP: 10.0000 mg | Freq: Every day | RECTAL | Status: DC | PRN

## 2013-08-06 MED ORDER — ACETAMINOPHEN 500 MG PO TABS
1000.0000 mg | ORAL_TABLET | Freq: Every day | ORAL | Status: DC
  Administered 2013-08-06: 1000 mg via ORAL
  Filled 2013-08-06 (×2): qty 2

## 2013-08-06 MED ORDER — ONDANSETRON HCL 4 MG/2ML IJ SOLN: 4.0000 mg | Freq: Four times a day (QID) | INTRAMUSCULAR | Status: DC | PRN

## 2013-08-06 MED ORDER — FENTANYL CITRATE 0.05 MG/ML IJ SOLN
INTRAMUSCULAR | Status: AC
  Filled 2013-08-06: qty 5

## 2013-08-06 MED ORDER — ROCURONIUM BROMIDE 100 MG/10ML IV SOLN
INTRAVENOUS | Status: DC | PRN
  Administered 2013-08-06: 50 mg via INTRAVENOUS

## 2013-08-06 MED ORDER — LIDOCAINE HCL (PF) 1 % IJ SOLN
INTRAMUSCULAR | Status: AC
Start: 2013-08-06 — End: 2013-08-06
  Filled 2013-08-06: qty 30

## 2013-08-06 MED ORDER — HEPARIN SODIUM (PORCINE) 1000 UNIT/ML IJ SOLN
INTRAMUSCULAR | Status: DC | PRN
  Administered 2013-08-06: 6000 [IU] via INTRAVENOUS

## 2013-08-06 MED ORDER — 0.9 % SODIUM CHLORIDE (POUR BTL) OPTIME
TOPICAL | Status: DC | PRN
  Administered 2013-08-06: 1000 mL

## 2013-08-06 MED ORDER — LABETALOL HCL 5 MG/ML IV SOLN: 10.0000 mg | INTRAVENOUS | Status: DC | PRN

## 2013-08-06 MED ORDER — ACETAMINOPHEN 650 MG RE SUPP: 325.0000 mg | RECTAL | Status: DC | PRN

## 2013-08-06 MED ORDER — ASPIRIN EC 325 MG PO TBEC
325.0000 mg | DELAYED_RELEASE_TABLET | Freq: Every day | ORAL | Status: DC
  Administered 2013-08-06 – 2013-08-07 (×2): 325 mg via ORAL
  Filled 2013-08-06 (×2): qty 1

## 2013-08-06 MED ORDER — ENOXAPARIN SODIUM 40 MG/0.4ML ~~LOC~~ SOLN
40.0000 mg | SUBCUTANEOUS | Status: DC
  Administered 2013-08-07: 40 mg via SUBCUTANEOUS
  Filled 2013-08-06 (×2): qty 0.4

## 2013-08-06 MED ORDER — DOCUSATE SODIUM 100 MG PO CAPS
100.0000 mg | ORAL_CAPSULE | Freq: Every day | ORAL | Status: DC
  Administered 2013-08-07: 100 mg via ORAL
  Filled 2013-08-06: qty 1

## 2013-08-06 MED ORDER — PROPOFOL 10 MG/ML IV BOLUS
INTRAVENOUS | Status: DC | PRN
  Administered 2013-08-06: 120 mg via INTRAVENOUS

## 2013-08-06 MED ORDER — DOPAMINE-DEXTROSE 3.2-5 MG/ML-% IV SOLN: 3.0000 ug/kg/min | INTRAVENOUS | Status: DC

## 2013-08-06 MED ORDER — SODIUM CHLORIDE 0.9 % IV SOLN
INTRAVENOUS | Status: DC
  Administered 2013-08-06: 17:00:00 via INTRAVENOUS

## 2013-08-06 MED ORDER — MUPIROCIN 2 % EX OINT: 1.0000 "application " | TOPICAL_OINTMENT | Freq: Every day | CUTANEOUS | Status: DC

## 2013-08-06 MED ORDER — SODIUM CHLORIDE 0.9 % IV SOLN: 500.0000 mL | Freq: Once | INTRAVENOUS | Status: AC | PRN

## 2013-08-06 MED ORDER — ATORVASTATIN CALCIUM 20 MG PO TABS
20.0000 mg | ORAL_TABLET | Freq: Every day | ORAL | Status: DC
  Administered 2013-08-06: 20 mg via ORAL
  Filled 2013-08-06 (×2): qty 1

## 2013-08-06 MED ORDER — HYPROMELLOSE (GONIOSCOPIC) 2.5 % OP SOLN
1.0000 [drp] | Freq: Four times a day (QID) | OPHTHALMIC | Status: DC | PRN
  Filled 2013-08-06: qty 15

## 2013-08-06 MED ORDER — EPHEDRINE SULFATE 50 MG/ML IJ SOLN
INTRAMUSCULAR | Status: AC
  Filled 2013-08-06: qty 2

## 2013-08-06 MED ORDER — ROCURONIUM BROMIDE 50 MG/5ML IV SOLN
INTRAVENOUS | Status: AC
  Filled 2013-08-06: qty 1

## 2013-08-06 MED ORDER — DEXAMETHASONE SODIUM PHOSPHATE 4 MG/ML IJ SOLN
INTRAMUSCULAR | Status: AC
  Filled 2013-08-06: qty 1

## 2013-08-06 MED ORDER — GLYCOPYRROLATE 0.2 MG/ML IJ SOLN
INTRAMUSCULAR | Status: AC
  Filled 2013-08-06: qty 4

## 2013-08-06 MED ORDER — OXYCODONE-ACETAMINOPHEN 5-325 MG PO TABS
ORAL_TABLET | ORAL | Status: AC
  Administered 2013-08-06: 2 via ORAL
  Filled 2013-08-06: qty 2

## 2013-08-06 MED ORDER — MIDAZOLAM HCL 2 MG/2ML IJ SOLN
INTRAMUSCULAR | Status: AC
Start: 2013-08-06 — End: 2013-08-06
  Filled 2013-08-06: qty 2

## 2013-08-06 MED ORDER — PROPOFOL 10 MG/ML IV BOLUS
INTRAVENOUS | Status: AC
  Filled 2013-08-06: qty 20

## 2013-08-06 MED ORDER — PHENOL 1.4 % MT LIQD: 1.0000 | OROMUCOSAL | Status: DC | PRN

## 2013-08-06 MED ORDER — LIDOCAINE HCL (CARDIAC) 20 MG/ML IV SOLN
INTRAVENOUS | Status: AC
  Filled 2013-08-06: qty 5

## 2013-08-06 MED ORDER — HYDRALAZINE HCL 20 MG/ML IJ SOLN: 10.0000 mg | INTRAMUSCULAR | Status: DC | PRN

## 2013-08-06 MED ORDER — ACETAMINOPHEN 325 MG PO TABS
325.0000 mg | ORAL_TABLET | ORAL | Status: DC | PRN
  Administered 2013-08-07: 650 mg via ORAL
  Filled 2013-08-06: qty 2

## 2013-08-06 SURGICAL SUPPLY — 49 items
BENZOIN TINCTURE PRP APPL 2/3 (GAUZE/BANDAGES/DRESSINGS) ×3 IMPLANT
CANISTER SUCTION 2500CC (MISCELLANEOUS) ×3 IMPLANT
CATH ROBINSON RED A/P 18FR (CATHETERS) ×3 IMPLANT
CLIP LIGATING EXTRA MED SLVR (CLIP) ×3 IMPLANT
CLIP LIGATING EXTRA SM BLUE (MISCELLANEOUS) ×3 IMPLANT
CLOSURE STERI-STRIP 1/2X4 (GAUZE/BANDAGES/DRESSINGS) ×1
CLOSURE WOUND 1/2 X4 (GAUZE/BANDAGES/DRESSINGS) ×1
CLSR STERI-STRIP ANTIMIC 1/2X4 (GAUZE/BANDAGES/DRESSINGS) ×2 IMPLANT
COVER SURGICAL LIGHT HANDLE (MISCELLANEOUS) ×3 IMPLANT
CRADLE DONUT ADULT HEAD (MISCELLANEOUS) ×3 IMPLANT
DECANTER SPIKE VIAL GLASS SM (MISCELLANEOUS) IMPLANT
DRAIN HEMOVAC 1/8 X 5 (WOUND CARE) IMPLANT
DRAPE WARM FLUID 44X44 (DRAPE) ×3 IMPLANT
DRSG COVADERM 4X6 (GAUZE/BANDAGES/DRESSINGS) ×3 IMPLANT
ELECT REM PT RETURN 9FT ADLT (ELECTROSURGICAL) ×3
ELECTRODE REM PT RTRN 9FT ADLT (ELECTROSURGICAL) ×1 IMPLANT
EVACUATOR SILICONE 100CC (DRAIN) IMPLANT
GEL ULTRASOUND 20GR AQUASONIC (MISCELLANEOUS) IMPLANT
GLOVE BIO SURGEON STRL SZ 6.5 (GLOVE) ×2 IMPLANT
GLOVE BIO SURGEONS STRL SZ 6.5 (GLOVE) ×1
GLOVE BIOGEL PI IND STRL 6.5 (GLOVE) ×1 IMPLANT
GLOVE BIOGEL PI INDICATOR 6.5 (GLOVE) ×2
GLOVE SS BIOGEL STRL SZ 7.5 (GLOVE) ×1 IMPLANT
GLOVE SUPERSENSE BIOGEL SZ 7.5 (GLOVE) ×2
GOWN STRL REUS W/ TWL LRG LVL3 (GOWN DISPOSABLE) ×3 IMPLANT
GOWN STRL REUS W/TWL LRG LVL3 (GOWN DISPOSABLE) ×6
KIT BASIN OR (CUSTOM PROCEDURE TRAY) ×3 IMPLANT
KIT ROOM TURNOVER OR (KITS) ×3 IMPLANT
NEEDLE 22X1 1/2 (OR ONLY) (NEEDLE) IMPLANT
NS IRRIG 1000ML POUR BTL (IV SOLUTION) ×6 IMPLANT
PACK CAROTID (CUSTOM PROCEDURE TRAY) ×3 IMPLANT
PAD ARMBOARD 7.5X6 YLW CONV (MISCELLANEOUS) ×6 IMPLANT
PATCH HEMASHIELD 8X75 (Vascular Products) ×3 IMPLANT
SHUNT CAROTID BYPASS 10 (VASCULAR PRODUCTS) ×3 IMPLANT
SHUNT CAROTID BYPASS 12FRX15.5 (VASCULAR PRODUCTS) IMPLANT
STRIP CLOSURE SKIN 1/2X4 (GAUZE/BANDAGES/DRESSINGS) ×2 IMPLANT
SUT ETHILON 3 0 PS 1 (SUTURE) IMPLANT
SUT PROLENE 6 0 CC (SUTURE) ×9 IMPLANT
SUT SILK 2 0 (SUTURE) ×2
SUT SILK 2-0 18XBRD TIE 12 (SUTURE) ×1 IMPLANT
SUT SILK 3 0 (SUTURE)
SUT SILK 3-0 18XBRD TIE 12 (SUTURE) IMPLANT
SUT VIC AB 3-0 SH 27 (SUTURE) ×4
SUT VIC AB 3-0 SH 27X BRD (SUTURE) ×2 IMPLANT
SUT VICRYL 4-0 PS2 18IN ABS (SUTURE) ×3 IMPLANT
SYR CONTROL 10ML LL (SYRINGE) IMPLANT
TOWEL OR 17X24 6PK STRL BLUE (TOWEL DISPOSABLE) ×3 IMPLANT
TOWEL OR 17X26 10 PK STRL BLUE (TOWEL DISPOSABLE) ×3 IMPLANT
WATER STERILE IRR 1000ML POUR (IV SOLUTION) ×3 IMPLANT

## 2013-08-06 NOTE — Transfer of Care (Signed)
Immediate Anesthesia Transfer of Care Note  Patient: Kerri CaffeyDoris J Snyder  Procedure(s) Performed: Procedure(s):  Carotid Endarterectomy with hemashield patch angioplasty (Right)  Patient Location: PACU  Anesthesia Type:General  Level of Consciousness: awake, alert  and oriented  Airway & Oxygen Therapy: Patient Spontanous Breathing and Patient connected to nasal cannula oxygen  Post-op Assessment: Report given to PACU RN, Post -op Vital signs reviewed and stable, Patient moving all extremities X 4 and Patient able to stick tongue midline  Post vital signs: Reviewed and stable  Complications: No apparent anesthesia complications

## 2013-08-06 NOTE — OR Nursing (Signed)
730 preop neuro - bilateral handgrips equal and strong, moves all four extremities, tongue midline. 955 post op neuro- same as preop

## 2013-08-06 NOTE — Op Note (Signed)
     Patient name: Kerri Snyder MRN: [id number] DOB: 1941-03-17 Sex: female  08/06/2013 Pre-operative Diagnosis: Asymptomatic right carotid stenosis Post-operative diagnosis:  Same Surgeon:  Larina Earthlyodd F. Jadiel Schmieder, M.D. Assistants:  Dr Hart RochesterLawson Procedure:    right carotid Endarterectomy with Dacron patch angioplasty Anesthesia:  General Blood Loss:  See anesthesia record   Indications for surgery:  >80 % stenosis  Procedure in detail:  The patient was taken to the operating and placed in the supine position. The neck was prepped and draped in the usual sterile fashion. An incision was made anterior to the sternocleidomastoid muscle and continued with electrocautery through the platysma muscle. The muscle was retracted posteriorly and the carotid sheath was opened. The facial vein was ligated with 2-0 silk ties and divided. The common carotid artery was encircled with an umbilical tape and Rummel tourniquet. Dissection was continued onto the carotid bifurcation. The superior thyroid artery was controlled with a 2-0 silk Potts tie. The external carotid organ was encircled with a vessel loop and the internal carotid was encircled with umbilical tape and Rummel tourniquet. The hypoglossal and vagus nerves were identified and preserved.  The patient was given systemic heparinization. After adequate circulation time, the internal,external and common carotid arteries were occluded. The common carotid was opened with an 11 blade and the arteriotomy was continued with Potts scissors onto the internal carotid artery. A 10 shunt was passed up the internal carotid artery, allowed to back bleed, and then passed down the common carotid artery. The shunt was secured with Rummel tourniquet. The endarterectomy was begun on the common carotid artery  plaque was divided proximally with Potts scissors. The endarterectomy was continued onto the carotid bifurcation. The external carotid was endarterectomized by eversion technique  and the internal carotid artery was endarterectomized in an open fashion. Remaining debris was removed from the endarterectomy plane. A Dacron patch was brought to the field and sewn as a patch angioplasty. Prior to completing the anastomosis, the shunt was removed and the usual flushing maneuvers were undertaken. The anastomosis was then completed and flow was restored first to the external and then the internal carotid artery. Excellent flow characteristics were noted with hand-held Doppler in the internal and external carotid arteries.  The patient was given protamine to reverse the heparin. Hemostasis was obtained with electrocautery. The wounds were irrigated with saline. The wound was closed by first reapproximating the sternocleidomastoid muscle over the carotid artery with interrupted 3-0 Vicryl sutures. Next, the platysma was closed with a running 3-0 Vicryl suture. The skin was closed with a 4-0 subcuticular Vicryl suture. Benzoin and Steri-Strips were applied to the incision. A sterile dressing was placed over the incision. All sponge and needle counts were correct. The patient was awakened in the operating room, neurologically intact. They were transferred to the PACU in stable condition.  Carotid stenosis at surgery:>80%  Disposition:  To PACU in stable condition,neurologically intact  Relevant Operative Details:  Severe stenosis  Larina Earthlyodd F. Dameka Younker, M.D. [facility] [place] Office: [phone] Pager:  [phone]

## 2013-08-06 NOTE — Progress Notes (Signed)
Pharmacy is consulted to adjust antibiotic for renal function.  Patient is on zinacef 1.5gm IV q12h x2 post-op-- dose is appropriate for renal function.    Will sign off.   Dorna LeitzAnh Latrina Guttman, PharmD, BCPS

## 2013-08-06 NOTE — Progress Notes (Signed)
Utilization review completed.  

## 2013-08-06 NOTE — Anesthesia Procedure Notes (Signed)
Procedure Name: Intubation Date/Time: 08/06/2013 7:54 AM Performed by: Kerri Snyder, Kerri Snyder Pre-anesthesia Checklist: Patient identified, Timeout performed, Emergency Drugs available, Suction available and Patient being monitored Patient Re-evaluated:Patient Re-evaluated prior to inductionOxygen Delivery Method: Circle system utilized and Simple face mask Preoxygenation: Pre-oxygenation with 100% oxygen Intubation Type: IV induction Ventilation: Mask ventilation without difficulty Laryngoscope Size: Miller and 2 Grade View: Grade I Tube type: Oral Tube size: 7.5 mm Number of attempts: 1 Airway Equipment and Method: Patient positioned with wedge pillow and Stylet Placement Confirmation: ETT inserted through vocal cords under direct vision,  positive ETCO2 and breath sounds checked- equal and bilateral Secured at: 23 cm Tube secured with: Tape Dental Injury: Teeth and Oropharynx as per pre-operative assessment

## 2013-08-06 NOTE — Anesthesia Preprocedure Evaluation (Signed)
Anesthesia Evaluation  Patient identified by MRN, date of birth, ID band Patient awake    Reviewed: Allergy & Precautions, H&P , NPO status , Patient's Chart, lab work & pertinent test results  Airway Mallampati: I TM Distance: >3 FB Neck ROM: Full    Dental   Pulmonary          Cardiovascular + Peripheral Vascular Disease     Neuro/Psych TIACVA, No Residual Symptoms    GI/Hepatic   Endo/Other    Renal/GU      Musculoskeletal   Abdominal   Peds  Hematology   Anesthesia Other Findings   Reproductive/Obstetrics                           Anesthesia Physical Anesthesia Plan  ASA: III  Anesthesia Plan: General   Post-op Pain Management:    Induction: Intravenous  Airway Management Planned: Oral ETT  Additional Equipment: Arterial line  Intra-op Plan:   Post-operative Plan: Extubation in OR  Informed Consent: I have reviewed the patients History and Physical, chart, labs and discussed the procedure including the risks, benefits and alternatives for the proposed anesthesia with the patient or authorized representative who has indicated his/her understanding and acceptance.     Plan Discussed with: CRNA and Surgeon  Anesthesia Plan Comments:         Anesthesia Quick Evaluation

## 2013-08-06 NOTE — Anesthesia Postprocedure Evaluation (Signed)
Anesthesia Post Note  Patient: Kerri Snyder  Procedure(s) Performed: Procedure(s) (LRB):  Carotid Endarterectomy with hemashield patch angioplasty (Right)  Anesthesia type: general  Patient location: PACU  Post pain: Pain level controlled  Post assessment: Patient's Cardiovascular Status Stable  Last Vitals:  Filed Vitals:   08/06/13 1130  BP:   Pulse: 73  Temp: 36.7 C  Resp: 14    Post vital signs: Reviewed and stable  Level of consciousness: sedated  Complications: No apparent anesthesia complications

## 2013-08-06 NOTE — Telephone Encounter (Addendum)
Message copied by ROUX, BONNIE A on Fri Apr 24, 2015Rosalyn Charters 11:14 AM ------      Message from: Sharee PimpleMCCHESNEY, MARILYN K      Created: Fri Aug 06, 2013  9:47 AM      Regarding: Schedule                   ----- Message -----         From: Lars MageEmma M Collins, PA-C         Sent: 08/06/2013   8:56 AM           To: [facility] Charge Pool            F/U in office with Dr. Arbie CookeyEarly in 2 weeks s/p right carotid ------ notified patient of fu appt. with dr. early on 08-17-13 at 11am

## 2013-08-06 NOTE — H&P (View-Only) (Signed)
Here today for followup of her left carotid endarterectomy for symptomatic carotid disease. She had preoperative aphasia and brief hospitalization. She was discharged home and readmitted for left carotid surgery. She did quite well and was discharged home on postoperative day 1. She is returned to her baseline preoperative status with no further A. aphasia. She reports no discomfort regarding her surgery and surgical incision. She does have the usual amount of peri-incisional numbness  On physical exam she has no carotid bruits. Her left neck incision is well-healed with the usual healing ridge.  Impression and plan stable status post endarterectomy for symptomatic carotid disease in the left. Her CT angiogram showed critical stenosis greater than 90% and her right carotid. She wishes to proceed with correction of this as well. We will schedule this on Friday at her request on 08/06/13 for admission the surgery. She understands the procedure and 1-2% risk of stroke with surgery.  She is allowed to return to work without limitations prior to

## 2013-08-06 NOTE — Interval H&P Note (Signed)
History and Physical Interval Note:  08/06/2013 7:15 AM  Kerri Snyder  has presented today for surgery, with the diagnosis of Carotid stenosis  The various methods of treatment have been discussed with the patient and family. After consideration of risks, benefits and other options for treatment, the patient has consented to  Procedure(s): ENDARTERECTOMY CAROTID (Right) as a surgical intervention .  The patient's history has been reviewed, patient examined, no change in status, stable for surgery.  I have reviewed the patient's chart and labs.  Questions were answered to the patient's satisfaction.     Kristen Loaderodd F Sameen Leas

## 2013-08-07 LAB — BASIC METABOLIC PANEL
BUN: 9 mg/dL (ref 6–23)
CO2: 25 meq/L (ref 19–32)
CREATININE: 0.5 mg/dL (ref 0.50–1.10)
Calcium: 9.4 mg/dL (ref 8.4–10.5)
Chloride: 107 mEq/L (ref 96–112)
GFR calc non Af Amer: >90 mL/min (ref >90)
Glucose, Bld: 101 mg/dL — ABNORMAL HIGH (ref 70–99)
Potassium: 4.4 mEq/L (ref 3.7–5.3)
Sodium: 142 mEq/L (ref 137–147)

## 2013-08-07 LAB — CBC
HCT: 37.5 % (ref 36.0–46.0)
Hemoglobin: 12.4 g/dL (ref 12.0–15.0)
MCH: 29.6 pg (ref 26.0–34.0)
MCHC: 33.1 g/dL (ref 30.0–36.0)
MCV: 89.5 fL (ref 78.0–100.0)
Platelets: 237 10*3/uL (ref 150–400)
RBC: 4.19 MIL/uL (ref 3.87–5.11)
RDW: 13.3 % (ref 11.5–15.5)
WBC: 7.5 10*3/uL (ref 4.0–10.5)

## 2013-08-07 NOTE — Progress Notes (Signed)
Discharge instructions given to pt -- verbalized understanding of follow-up appts, activity levels, home meds, wound care, s/s of complications (including stroke) and when to call MD/EMS. Danna Heftyhristy Lee Sahmir Weatherbee, RN

## 2013-08-07 NOTE — Progress Notes (Addendum)
[facility] [place]  Subjective  - Doing well.  Taking PO's well, voided and ambulating with help.  She is legally blind.  Asymptomatic right carotid stenosis.    Objective 131/62 75 97.9 F (36.6 C) (Oral) 10 97%  Intake/Output Summary (Last 24 hours) at 08/07/13 0731 Last data filed at 08/06/13 2140  Gross per 24 hour  Intake   2580 ml  Output    400 ml  Net   2180 ml    Incision C/D/I Palpable radial pulses equal Grip 5/5 No tongue deviation and symmetrical smile.   Assessment/Planning: POD #1 right carotid Endarterectomy with Dacron patch angioplasty D/C home    Lars Magemma M Collins 08/07/2013 7:31 AM --  Laboratory Lab Results:  Recent Labs  08/07/13 0530  WBC 7.5  HGB 12.4  HCT 37.5  PLT 237   BMET  Recent Labs  08/07/13 0530  NA 142  K 4.4  CL 107  CO2 25  GLUCOSE 101*  BUN 9  CREATININE 0.50  CALCIUM 9.4    COAG Lab Results  Component Value Date   INR 0.90 07/30/2013   INR 0.93 06/24/2013   INR 1.04 06/15/2013   No results found for this basename: PTT   Addendum  I have independently interviewed and examined the patient, and I agree with the physician assistant's findings.  Neuro intact.  BP ok.  No hematoma.  Ok to D/C.  Follow with Dr. Arbie CookeyEarly in 2 weeks  Leonides SakeBrian Chen, MD [facility] [place] Office: [phone] Pager: [phone]  08/07/2013, 11:20 AM

## 2013-08-09 ENCOUNTER — Encounter: Payer: Self-pay | Admitting: *Deleted

## 2013-08-10 ENCOUNTER — Encounter (HOSPITAL_COMMUNITY): Payer: Self-pay | Admitting: Vascular Surgery

## 2013-08-11 NOTE — Discharge Summary (Signed)
[facility] Discharge Summary   Patient ID:  Kerri Snyder MRN: [id number] DOB/AGE: [birth year] 73 y.o.  Admit date: 08/06/2013 Discharge date: 08/11/2013 Date of Surgery: 08/06/2013 Surgeon: Surgeon(s): Larina Earthlyodd F Early, MD Pryor OchoaJames D Lawson, MD  Admission Diagnosis: Carotid stenosis  Discharge Diagnoses:  Carotid stenosis  Secondary Diagnoses: Past Medical History  Diagnosis Date  . Blind     legally blind  . Hyperlipidemia   . DDD (degenerative disc disease), lumbar   . Sciatica   . Family history of anesthesia complication     cousin with nausea and vomiting post anesthesia  . Stroke 3/15    Procedure(s):  Carotid Endarterectomy with hemashield patch angioplasty  Discharged Condition: good  HPI: 73 y/o female with symptomatic carotid disease.  She had preoperative aphasia and brief hospitalization. She was discharged home and readmitted for left carotid surgery. She did quite well and was discharged home on postoperative day 1. She is returned to her baseline preoperative status with no further A. aphasia. She reports no discomfort regarding her surgery and surgical incision. She does have the usual amount of peri-incisional numbness  On physical exam she has no carotid bruits. Her left neck incision is well-healed with the usual healing ridge.  Impression and plan stable status post endarterectomy for symptomatic carotid disease in the left. Her CT angiogram showed critical stenosis greater than 90% and her right carotid. She wishes to proceed with correction of this as well.   Her Surgery was uneventful.  POD #1 she was ambulating, taking PO's well.  Neuro intact.  She will be discharged home and follow up in the office in 2 weeks.      Hospital Course:  Kerri Snyder is a 73 y.o. female is S/P Right Procedure(s):  Carotid Endarterectomy with hemashield patch angioplasty Extubated: POD # 0 Physical exam: Incision C/D/I  Palpable radial pulses equal   Grip 5/5  No tongue deviation and symmetrical smile.  Post-op wounds healing well Pt. Ambulating, voiding and taking PO diet without difficulty. Pt pain controlled with PO pain meds. Labs as below Complications:none  Consults:     Significant Diagnostic Studies: CBC Lab Results  Component Value Date   WBC 7.5 08/07/2013   HGB 12.4 08/07/2013   HCT 37.5 08/07/2013   MCV 89.5 08/07/2013   PLT 237 08/07/2013    BMET    Component Value Date/Time   NA 142 08/07/2013 0530   K 4.4 08/07/2013 0530   CL 107 08/07/2013 0530   CO2 25 08/07/2013 0530   GLUCOSE 101* 08/07/2013 0530   BUN 9 08/07/2013 0530   CREATININE 0.50 08/07/2013 0530   CALCIUM 9.4 08/07/2013 0530   GFRNONAA >90 08/07/2013 0530   GFRAA >90 08/07/2013 0530   COAG Lab Results  Component Value Date   INR 0.90 07/30/2013   INR 0.93 06/24/2013   INR 1.04 06/15/2013     Disposition:  Discharge to :Home Discharge Orders   Future Appointments Provider Department Dept Phone   08/17/2013 11:00 AM Larina Earthlyodd F Early, MD [facility] [phone]   Future Orders Complete By Expires   Call MD for:  redness, tenderness, or signs of infection (pain, swelling, bleeding, redness, odor or green/yellow discharge around incision site)  As directed    Call MD for:  severe or increased pain, loss or decreased feeling  in affected limb(s)  As directed    Call MD for:  temperature >100.5  As directed  Discharge patient  As directed    Driving Restrictions  As directed    Increase activity slowly  As directed    Lifting restrictions  As directed    May shower   As directed    Resume previous diet  As directed        Medication List         acetaminophen 500 MG tablet  Commonly known as:  TYLENOL  Take 1,000 mg by mouth at bedtime.     B-12 PO  Take 1 tablet by mouth every morning.     clopidogrel 75 MG tablet  Commonly known as:  PLAVIX  Take 1 tablet (75 mg total) by mouth daily with breakfast.      hydroxypropyl methylcellulose 2.5 % ophthalmic solution  Commonly known as:  ISOPTO TEARS  Place 1 drop into both eyes 4 (four) times daily as needed for dry eyes.     mupirocin ointment 2 %  Commonly known as:  BACTROBAN  Place 1 application into the nose daily. For 5 days     oxyCODONE-acetaminophen 5-325 MG per tablet  Commonly known as:  PERCOCET/ROXICET  Take 1 tablet by mouth every 6 (six) hours as needed.     rosuvastatin 10 MG tablet  Commonly known as:  CRESTOR  Take 10 mg by mouth at bedtime.     VITAMIN C PO  Take 1 tablet by mouth every morning.     VITAMIN D PO  Take 1 tablet by mouth every morning.       Verbal and written Discharge instructions given to the patient. Wound care per Discharge AVS     Follow-up Information   Follow up with EARLY, TODD, MD In 2 weeks. (sent message to office)    Specialty:  Vascular Surgery   Contact information:   [address] [place] [address] [phone]       Signed: Lars Magemma M Jacki Couse 08/11/2013, 2:08 PM  --- For [facility] use --- Instructions: Press F2 to tab through selections.  Delete question if not applicable.   Modified Rankin score at D/C (0-6): Rankin Score=0  IV medication needed for:  1. Hypertension: No 2. Hypotension: No  Post-op Complications: No  1. Post-op CVA or TIA: No  If yes: Event classification (right eye, left eye, right cortical, left cortical, verterobasilar, other):   If yes: Timing of event (intra-op, <6 hrs post-op, >=6 hrs post-op, unknown):   2. CN injury: No  If yes: CN  injuried   3. Myocardial infarction: No  If yes: Dx by (EKG or clinical, Troponin):   4.  CHF: No  5.  Dysrhythmia (new): No  6. Wound infection: No  7. Reperfusion symptoms: No  8. Return to OR: No  If yes: return to OR for (bleeding, neurologic, other CEA incision, other):   Discharge medications:  Statin use: Yes  ASA use: Yes  Beta blocker use: No for medical reason  ACE-Inhibitor  use: No for medical reason  P2Y12 Antagonist use: [ ]  None, [x ] Plavix, [ ]  Plasugrel, [ ]  Ticlopinine, [ ]  Ticagrelor, [ ]  Other, [ ]  No for medical reason, [ ]  Non-compliant, [ ]  Not-indicated  Anti-coagulant use: [ ]  None, [ ]  Warfarin, [ ]  Rivaroxaban, [ ]  Dabigatran, [ ]  Other, [ ]  No for medical reason, [ ]  Non-compliant, [ ]  Not-indicated

## 2013-08-16 ENCOUNTER — Encounter: Payer: Self-pay | Admitting: Vascular Surgery

## 2013-08-17 ENCOUNTER — Ambulatory Visit: Payer: Medicare Other | Admitting: Vascular Surgery

## 2013-08-23 ENCOUNTER — Encounter: Payer: Self-pay | Admitting: Family

## 2013-08-24 ENCOUNTER — Encounter: Payer: Self-pay | Admitting: Vascular Surgery

## 2013-08-24 ENCOUNTER — Ambulatory Visit (INDEPENDENT_AMBULATORY_CARE_PROVIDER_SITE_OTHER): Payer: Self-pay | Admitting: Family

## 2013-08-24 ENCOUNTER — Encounter: Payer: Self-pay | Admitting: Family

## 2013-08-24 VITALS — BP 149/89 | HR 83 | Temp 97.1°F | Resp 16 | Ht 65.0 in | Wt 137.0 lb

## 2013-08-24 DIAGNOSIS — I6529 Occlusion and stenosis of unspecified carotid artery: Secondary | ICD-10-CM

## 2013-08-24 DIAGNOSIS — Z48812 Encounter for surgical aftercare following surgery on the circulatory system: Secondary | ICD-10-CM

## 2013-08-24 NOTE — Patient Instructions (Signed)

## 2013-08-24 NOTE — Progress Notes (Signed)
Established Carotid Patient   History of Present Illness Dr Lynnette Caffeyoris J Presutti is a 73 y.o. female patient of Dr. Arbie CookeyEarly who is here for 2 weeks post of check s/p right CEA on 08/06/13 for a greater than 80% stenosis; she is also s/p left CEA on 06/25/13. She had a small stroke on 06/14/13 as manifested by expressive aphasia for a few days, denies hemiparesis. She denies any further stroke or TIA activity.  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 Her nystagmus is associated with her albinoism. She has sciatic pain in her right hip and arthritis pain, denies claudication symptoms. Pt denies cardiac issues.  Pt Diabetic: No Pt smoker: non-smoker  Pt meds include: Statin : Yes ASA: No Other anticoagulants/antiplatelets: Plavix   Past Medical History  Diagnosis Date  . Blind     legally blind  . Hyperlipidemia   . DDD (degenerative disc disease), lumbar   . Sciatica   . Family history of anesthesia complication     cousin with nausea and vomiting post anesthesia  . Stroke 3/15    Social History History  Substance Use Topics  . Smoking status: Never Smoker   . Smokeless tobacco: Never Used  . Alcohol Use: No    Family History Family History  Problem Relation Age of Onset  . Diabetes Father   . Heart disease Father     Before age 75  . Alcohol abuse Father   . Alzheimer's disease Mother     Surgical History Past Surgical History  Procedure Laterality Date  . Tubal ligation    . Cesarean section    . Ear drum replacement 2000 Right 2000  . Tee without cardioversion N/A 06/17/2013    Procedure: TRANSESOPHAGEAL ECHOCARDIOGRAM (TEE);  Surgeon: Vesta MixerPhilip J Nahser, MD;  [facility];  Service: Cardiovascular;  Laterality: N/A;  . Tonsillectomy    . Endarterectomy Left 06/25/2013    Procedure: ENDARTERECTOMY CAROTID;  Surgeon: Larina Earthlyodd F  Early, MD;  [facility];  Service: Vascular;  Laterality: Left;  . Endarterectomy Right 08/06/2013    Procedure:  Carotid Endarterectomy with hemashield patch angioplasty;  Surgeon: Larina Earthlyodd F Early, MD;  [facility];  Service: Vascular;  Laterality: Right;  . Carotid endarterectomy Left June 25, 2013    cea  . Carotid endarterectomy Right August 06, 2013    cea    No Known Allergies  Current Outpatient Prescriptions  Medication Sig Dispense Refill  . acetaminophen (TYLENOL) 500 MG tablet Take 1,000 mg by mouth at bedtime.      . Ascorbic Acid (VITAMIN C PO) Take 1 tablet by mouth every morning.      . Cholecalciferol (VITAMIN D PO) Take 1 tablet by mouth every morning.      . clopidogrel (PLAVIX) 75 MG tablet Take 1 tablet (75 mg total) by mouth daily with breakfast.  30 tablet  0  . Cyanocobalamin (B-12 PO) Take 1 tablet by mouth every morning.      . hydroxypropyl methylcellulose (ISOPTO TEARS) 2.5 % ophthalmic solution Place 1 drop into both eyes 4 (four) times daily as needed for dry eyes.      . mupirocin ointment (BACTROBAN) 2 % Place 1 application into the nose daily. For 5 days      . oxyCODONE-acetaminophen (PERCOCET/ROXICET) 5-325 MG per tablet Take 1 tablet by mouth every 6 (six)  hours as needed.  30 tablet  0  . rosuvastatin (CRESTOR) 10 MG tablet Take 10 mg by mouth at bedtime.        No current facility-administered medications for this visit.    Review of Systems : See HPI for pertinent positives and negatives.  Physical Examination   Filed Vitals:   08/24/13 0936 08/24/13 0940  BP: 162/83 149/89  Pulse: 84 83  Temp:  97.1 F (36.2 C)  TempSrc:  Oral  Resp:  16  Height:  5\' 5"  (1.651 m)  Weight:  137 lb (62.143 kg)  SpO2:  97%  Body mass index is 22.8 kg/(m^2).   General: WDWN female in NAD GAIT: normal Eyes: PERRLA Pulmonary:  Non-labored, CTAB, Negative  Rales, Negative rhonchi, & Negative wheezing.  Cardiac: regular Rhythm ,  Negative detected  murmur.  VASCULAR EXAM Carotid Bruits Left Right   Negative Negative     Radial pulses are left is 1+ , right is 2+ palpable.                                                                                                                            LE Pulses LEFT RIGHT       POPLITEAL  not palpable   not palpable       POSTERIOR TIBIAL  2+ palpable   2+ palpable        DORSALIS PEDIS      ANTERIOR TIBIAL 2+ palpable  1+ palpable     Gastrointestinal: soft, nontender, BS WNL, no r/g,  negative masses.  Musculoskeletal: Negative muscle atrophy/wasting. M/S 5/5 throughout except LE's ARE 4/5, Extremities without ischemic changes  Neurologic: A&O X 3; Appropriate Affect ; SENSATION ;normal;  Speech is normal CN 2-12 intact except nystagmus present, Pain and light touch intact in extremities, Motor exam as listed above.    Assessment: Lynnette CaffeyDoris J Mccreery is a 73 y.o. female who is s/p right CEA on 08/06/13 for a greater than 80% stenosis; she is also s/p left CEA on 06/25/13. She had a small stroke on 06/14/13 as manifested by expressive aphasia for a few days, denies hemiparesis. She denies any further stroke or TIA activity. She has no neurological deficits, her right CEA incision is healing nicely with minimal swelling, edges well proximated, no signs of infection. The steri strips were removed. Dr. Arbie CookeyEarly spoke with and examined patient also.  Plan: Follow-up in 6 months with Carotid Duplex scan.   I discussed in depth with the patient the nature of atherosclerosis, and emphasized the importance of maximal medical management including strict control of blood pressure, blood glucose, and lipid levels, obtaining regular exercise, and continued cessation of smoking.  The patient is aware that without maximal medical management the underlying atherosclerotic disease process will progress, limiting the benefit of any interventions. The patient was given information about stroke prevention and  what symptoms should prompt the patient to seek immediate medical care. Thank you for  allowing [place] to participate in this patient's care.  Charisse March, RN, MSN, FNP-C [facility] [employer] Office: [phone]  Clinic Physician: Early  08/24/2013 9:41 AM

## 2014-02-05 ENCOUNTER — Encounter (HOSPITAL_COMMUNITY): Payer: Self-pay | Admitting: Emergency Medicine

## 2014-02-05 ENCOUNTER — Emergency Department (HOSPITAL_COMMUNITY)
Admission: EM | Admit: 2014-02-05 | Discharge: 2014-02-05 | Disposition: A | Discharge status: Home / Self Care | Payer: Medicare Other | Source: Home / Self Care | Attending: Family Medicine | Admitting: Family Medicine

## 2014-02-05 DIAGNOSIS — I10 Essential (primary) hypertension: Secondary | ICD-10-CM

## 2014-02-05 DIAGNOSIS — G44209 Tension-type headache, unspecified, not intractable: Secondary | ICD-10-CM

## 2014-02-05 LAB — POCT I-STAT, CHEM 8
BUN: 16 mg/dL (ref 6–23)
CALCIUM ION: 1.22 mmol/L (ref 1.13–1.30)
Chloride: 105 mEq/L (ref 96–112)
Creatinine, Ser: 0.9 mg/dL (ref 0.50–1.10)
Glucose, Bld: 101 mg/dL — ABNORMAL HIGH (ref 70–99)
HEMATOCRIT: 44 % (ref 36.0–46.0)
HEMOGLOBIN: 15 g/dL (ref 12.0–15.0)
Potassium: 4.1 mEq/L (ref 3.7–5.3)
Sodium: 142 mEq/L (ref 137–147)
TCO2: 26 mmol/L (ref 0–100)

## 2014-02-05 MED ORDER — HYDROCHLOROTHIAZIDE 12.5 MG PO CAPS: 12.5000 mg | ORAL_CAPSULE | Freq: Every day | ORAL | Status: DC

## 2014-02-05 NOTE — ED Notes (Signed)
C/o headaches off and on x 2 weeks.  Once or twice it got severe.  She took Advil or Tylenol with partial relief.  No speech problems as long as she talks slow.  No weakness in arms or leg or facial drooping.  Had carotid endarterectomy L  on 07/2013 and R done a month later. States she did small stroke on the L.  No hx. of hypertension until she had the stroke, but is not on BP medication now.

## 2014-02-05 NOTE — Discharge Instructions (Signed)
Thank you for coming in today. Take hydrochlorothiazide daily Followup with your primary care doctor in a week or so. Go to the emergency room if you get worse or have weakness or numbness impaired speech or changes in vision.   General Headache Without Cause A headache is pain or discomfort felt around the head or neck area. The specific cause of a headache may not be found. There are many causes and types of headaches. A few common ones are:  Tension headaches.  Migraine headaches.  Cluster headaches.  Chronic daily headaches. HOME CARE INSTRUCTIONS   Keep all follow-up appointments with your caregiver or any specialist referral.  Only take over-the-counter or prescription medicines for pain or discomfort as directed by your caregiver.  Lie down in a dark, quiet room when you have a headache.  Keep a headache journal to find out what may trigger your migraine headaches. For example, write down:  What you eat and drink.  How much sleep you get.  Any change to your diet or medicines.  Try massage or other relaxation techniques.  Put ice packs or heat on the head and neck. Use these 3 to 4 times per day for 15 to 20 minutes each time, or as needed.  Limit stress.  Sit up straight, and do not tense your muscles.  Quit smoking if you smoke.  Limit alcohol use.  Decrease the amount of caffeine you drink, or stop drinking caffeine.  Eat and sleep on a regular schedule.  Get 7 to 9 hours of sleep, or as recommended by your caregiver.  Keep lights dim if bright lights bother you and make your headaches worse. SEEK MEDICAL CARE IF:   You have problems with the medicines you were prescribed.  Your medicines are not working.  You have a change from the usual headache.  You have nausea or vomiting. SEEK IMMEDIATE MEDICAL CARE IF:   Your headache becomes severe.  You have a fever.  You have a stiff neck.  You have loss of vision.  You have muscular weakness  or loss of muscle control.  You start losing your balance or have trouble walking.  You feel faint or pass out.  You have severe symptoms that are different from your first symptoms. MAKE SURE YOU:   Understand these instructions.  Will watch your condition.  Will get help right away if you are not doing well or get worse. Document Released: 04/01/2005 Document Revised: 06/24/2011 Document Reviewed: 04/17/2011 [facility] Patient Information 2015 [place], [place]. This information is not intended to replace advice given to you by your health care provider. Make sure you discuss any questions you have with your health care provider.  Hypertension Hypertension, commonly called high blood pressure, is when the force of blood pumping through your arteries is too strong. Your arteries are the blood vessels that carry blood from your heart throughout your body. A blood pressure reading consists of a higher number over a lower number, such as 110/72. The higher number (systolic) is the pressure inside your arteries when your heart pumps. The lower number (diastolic) is the pressure inside your arteries when your heart relaxes. Ideally you want your blood pressure below 120/80. Hypertension forces your heart to work harder to pump blood. Your arteries may become narrow or stiff. Having hypertension puts you at risk for heart disease, stroke, and other problems.  RISK FACTORS Some risk factors for high blood pressure are controllable. Others are not.  Risk factors you cannot control  include:   Race. You may be at higher risk if you are African American.  Age. Risk increases with age.  Gender. Men are at higher risk than women before age 75 years. After age 75, women are at higher risk than men. Risk factors you can control include:  Not getting enough exercise or physical activity.  Being overweight.  Getting too much fat, sugar, calories, or salt in your diet.  Drinking too much  alcohol. SIGNS AND SYMPTOMS Hypertension does not usually cause signs or symptoms. Extremely high blood pressure (hypertensive crisis) may cause headache, anxiety, shortness of breath, and nosebleed. DIAGNOSIS  To check if you have hypertension, your health care provider will measure your blood pressure while you are seated, with your arm held at the level of your heart. It should be measured at least twice using the same arm. Certain conditions can cause a difference in blood pressure between your right and left arms. A blood pressure reading that is higher than normal on one occasion does not mean that you need treatment. If one blood pressure reading is high, ask your health care provider about having it checked again. TREATMENT  Treating high blood pressure includes making lifestyle changes and possibly taking medicine. Living a healthy lifestyle can help lower high blood pressure. You may need to change some of your habits. Lifestyle changes may include:  Following the DASH diet. This diet is high in fruits, vegetables, and whole grains. It is low in salt, red meat, and added sugars.  Getting at least 2 hours of brisk physical activity every week.  Losing weight if necessary.  Not smoking.  Limiting alcoholic beverages.  Learning ways to reduce stress. If lifestyle changes are not enough to get your blood pressure under control, your health care provider may prescribe medicine. You may need to take more than one. Work closely with your health care provider to understand the risks and benefits. HOME CARE INSTRUCTIONS  Have your blood pressure rechecked as directed by your health care provider.   Take medicines only as directed by your health care provider. Follow the directions carefully. Blood pressure medicines must be taken as prescribed. The medicine does not work as well when you skip doses. Skipping doses also puts you at risk for problems.   Do not smoke.   Monitor your  blood pressure at home as directed by your health care provider. SEEK MEDICAL CARE IF:   You think you are having a reaction to medicines taken.  You have recurrent headaches or feel dizzy.  You have swelling in your ankles.  You have trouble with your vision. SEEK IMMEDIATE MEDICAL CARE IF:  You develop a severe headache or confusion.  You have unusual weakness, numbness, or feel faint.  You have severe chest or abdominal pain.  You vomit repeatedly.  You have trouble breathing. MAKE SURE YOU:   Understand these instructions.  Will watch your condition.  Will get help right away if you are not doing well or get worse. Document Released: 04/01/2005 Document Revised: 08/16/2013 Document Reviewed: 01/22/2013 [facility] Patient Information 2015 [place], [place]. This information is not intended to replace advice given to you by your health care provider. Make sure you discuss any questions you have with your health care provider.

## 2014-02-05 NOTE — ED Provider Notes (Signed)
Kerri CaffeyDoris J Boeh is a 73 y.o. female who presents to Urgent Care today for headache and elevated blood pressure. Patient has a two-week history of mild (4/10) headache. The headache occurs intermittently. The headache onset is typically slow gradual. She denies any weakness or numbness bowel bladder dysfunction or difficulty walking trouble with coordination or new vision changes. Her medical history significant for albinism with visual impairment secondary to persistent nystagmus. Additionally she has a recent medical history for carotid artery disease status post bilateral endarterectomy and stenting. She has a history of a TIA a producing aphasia in April of 2015. She was intact neurologically currently before her current symptoms started. She takes Crestor and Plavix for carotid artery disease. She does not take blood pressure medicine. She notes that the past several days her blood pressure has been elevated in the systolic 170s range. She feels well otherwise and continues to work at [employer].   Past Medical History  Diagnosis Date  . Blind     legally blind  . Hyperlipidemia   . DDD (degenerative disc disease), lumbar   . Sciatica   . Family history of anesthesia complication     cousin with nausea and vomiting post anesthesia  . Stroke 3/15   History  Substance Use Topics  . Smoking status: Never Smoker   . Smokeless tobacco: Never Used  . Alcohol Use: No   ROS as above Medications: No current facility-administered medications for this encounter.   Current Outpatient Prescriptions  Medication Sig Dispense Refill  . acetaminophen (TYLENOL) 500 MG tablet Take 1,000 mg by mouth at bedtime.      . Ascorbic Acid (VITAMIN C PO) Take 1 tablet by mouth every morning.      . Cholecalciferol (VITAMIN D PO) Take 1 tablet by mouth every morning.      . clopidogrel (PLAVIX) 75 MG tablet Take 1 tablet (75 mg total) by mouth daily with breakfast.  30 tablet  0  . Cyanocobalamin  (B-12 PO) Take 1 tablet by mouth every morning.      . hydroxypropyl methylcellulose (ISOPTO TEARS) 2.5 % ophthalmic solution Place 1 drop into both eyes 4 (four) times daily as needed for dry eyes.      . rosuvastatin (CRESTOR) 10 MG tablet Take 10 mg by mouth at bedtime.       . hydrochlorothiazide (MICROZIDE) 12.5 MG capsule Take 1 capsule (12.5 mg total) by mouth daily.  30 capsule  0  . mupirocin ointment (BACTROBAN) 2 % Place 1 application into the nose daily. For 5 days      . oxyCODONE-acetaminophen (PERCOCET/ROXICET) 5-325 MG per tablet Take 1 tablet by mouth every 6 (six) hours as needed.  30 tablet  0    Exam:  BP 187/79  Pulse 84  Temp(Src) 97.9 F (36.6 C) (Oral)  Resp 12  SpO2 98% Gen: Well NAD spry-appearing elderly woman HEENT: ,  MMM persistent nystagmus Lungs: Normal work of breathing. CTABL Heart: RRR no MRG Abd: NABS, Soft. Nondistended, Nontender Exts: Brisk capillary refill, warm and well perfused.  Neuro: Alert and oriented normal speech and thought process. Cranial nerves are intact with the exception of nystagmus.  Normal sensation strength reflexes. Normal balance and gait.  Neck: Normal range of motion  Results for orders placed during the hospital encounter of 02/05/14 (from the past 24 hour(s))  POCT I-STAT, CHEM 8     Status: Abnormal   Collection Time    02/05/14 11:56 AM  Result Value Ref Range   Sodium 142  137 - 147 mEq/L   Potassium 4.1  3.7 - 5.3 mEq/L   Chloride 105  96 - 112 mEq/L   BUN 16  6 - 23 mg/dL   Creatinine, Ser 8.290.90  0.50 - 1.10 mg/dL   Glucose, Bld 562101 (*) 70 - 99 mg/dL   Calcium, Ion 1.301.22  8.651.13 - 1.30 mmol/L   TCO2 26  0 - 100 mmol/L   Hemoglobin 15.0  12.0 - 15.0 g/dL   HCT 78.444.0  69.636.0 - 29.546.0 %   No results found.  Assessment and Plan: 73 y.o. female with elevated blood pressure and headache.  Patient has no new neurological abnormalities. She appears to be very well in the clinic today. Her headache is mild and  intermittent. Very little probability for serious process such as intracranial hemorrhage as she has gradual onset of an intermittent mild headache is normal neck range of motion and unchanged neurologic exam. Plan to start low-dose hydrochlorothiazide and treat headache with Tylenol. Watchful waiting and followup with primary care provider.  Discussed warning signs or symptoms. Please see discharge instructions. Patient expresses understanding.     Rodolph BongEvan S Solace Manwarren, MD 02/05/14 [id number]

## 2014-02-23 ENCOUNTER — Other Ambulatory Visit (HOSPITAL_COMMUNITY): Payer: Medicare Other

## 2014-02-23 ENCOUNTER — Ambulatory Visit: Payer: Medicare Other | Admitting: Family

## 2014-02-24 ENCOUNTER — Encounter: Payer: Self-pay | Admitting: Family

## 2014-02-25 ENCOUNTER — Ambulatory Visit (HOSPITAL_COMMUNITY)
Admission: RE | Admit: 2014-02-25 | Discharge: 2014-02-25 | Disposition: A | Discharge status: Home / Self Care | Payer: Medicare Other | Source: Ambulatory Visit | Attending: Family | Admitting: Family

## 2014-02-25 ENCOUNTER — Ambulatory Visit (INDEPENDENT_AMBULATORY_CARE_PROVIDER_SITE_OTHER): Payer: Medicare Other | Admitting: Family

## 2014-02-25 ENCOUNTER — Encounter: Payer: Self-pay | Admitting: Family

## 2014-02-25 VITALS — BP 157/79 | HR 65 | Resp 14 | Ht 65.0 in | Wt 123.0 lb

## 2014-02-25 DIAGNOSIS — I6523 Occlusion and stenosis of bilateral carotid arteries: Secondary | ICD-10-CM | POA: Diagnosis present

## 2014-02-25 DIAGNOSIS — Z48812 Encounter for surgical aftercare following surgery on the circulatory system: Secondary | ICD-10-CM

## 2014-02-25 DIAGNOSIS — Z9889 Other specified postprocedural states: Secondary | ICD-10-CM

## 2014-02-25 NOTE — Patient Instructions (Signed)
Stroke Prevention Some medical conditions and behaviors are associated with an increased chance of having a stroke. You may prevent a stroke by making healthy choices and managing medical conditions. HOW CAN I REDUCE MY RISK OF HAVING A STROKE?   Stay physically active. Get at least 30 minutes of activity on most or all days.  Do not smoke. It may also be helpful to avoid exposure to secondhand smoke.  Limit alcohol use. Moderate alcohol use is considered to be:  No more than 2 drinks per day for men.  No more than 1 drink per day for nonpregnant women.  Eat healthy foods. This involves:  Eating 5 or more servings of fruits and vegetables a day.  Making dietary changes that address high blood pressure (hypertension), high cholesterol, diabetes, or obesity.  Manage your cholesterol levels.  Making food choices that are high in fiber and low in saturated fat, trans fat, and cholesterol may control cholesterol levels.  Take any prescribed medicines to control cholesterol as directed by your health care provider.  Manage your diabetes.  Controlling your carbohydrate and sugar intake is recommended to manage diabetes.  Take any prescribed medicines to control diabetes as directed by your health care provider.  Control your hypertension.  Making food choices that are low in salt (sodium), saturated fat, trans fat, and cholesterol is recommended to manage hypertension.  Take any prescribed medicines to control hypertension as directed by your health care provider.  Maintain a healthy weight.  Reducing calorie intake and making food choices that are low in sodium, saturated fat, trans fat, and cholesterol are recommended to manage weight.  Stop drug abuse.  Avoid taking birth control pills.  Talk to your health care provider about the risks of taking birth control pills if you are over 35 years old, smoke, get migraines, or have ever had a blood clot.  Get evaluated for sleep  disorders (sleep apnea).  Talk to your health care provider about getting a sleep evaluation if you snore a lot or have excessive sleepiness.  Take medicines only as directed by your health care provider.  For some people, aspirin or blood thinners (anticoagulants) are helpful in reducing the risk of forming abnormal blood clots that can lead to stroke. If you have the irregular heart rhythm of atrial fibrillation, you should be on a blood thinner unless there is a good reason you cannot take them.  Understand all your medicine instructions.  Make sure that other conditions (such as anemia or atherosclerosis) are addressed. SEEK IMMEDIATE MEDICAL CARE IF:   You have sudden weakness or numbness of the face, arm, or leg, especially on one side of the body.  Your face or eyelid droops to one side.  You have sudden confusion.  You have trouble speaking (aphasia) or understanding.  You have sudden trouble seeing in one or both eyes.  You have sudden trouble walking.  You have dizziness.  You have a loss of balance or coordination.  You have a sudden, severe headache with no known cause.  You have new chest pain or an irregular heartbeat. Any of these symptoms may represent a serious problem that is an emergency. Do not wait to see if the symptoms will go away. Get medical help at once. Call your local emergency services (911 in [place] Do not drive yourself to the hospital. Document Released: 05/09/2004 Document Revised: 08/16/2013 Document Reviewed: 10/02/2012 ExitCare Patient Information 2015 Exit[employer], [employer]. This information is not intended to replace advice given   to you by your health care provider. Make sure you discuss any questions you have with your health care provider.  

## 2014-02-25 NOTE — Progress Notes (Signed)
Established Carotid Patient   History of Present Illness  Kerri Snyder is a 73 y.o. female patient of Dr. Arbie CookeyEarly who is s/p right CEA on 08/06/13 for a greater than 80% stenosis; she is also s/p left CEA on 06/25/13. She returns today for follow up. She had a small stroke on 06/14/13 as manifested by expressive aphasia for a few days, denies hemiparesis. She denies any further stroke or TIA activity. 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 Her nystagmus is associated with her albinoism. She has intermittent sciatic pain in her right hip and arthritis pain, denies claudication symptoms. Pt denies cardiac issues. She has a good appetite and denies post prandial abdominal pain.   Pt Diabetic: No Pt smoker: non-smoker  Pt meds include: Statin : Yes ASA: No Other anticoagulants/antiplatelets: Plavix   Past Medical History  Diagnosis Date  . Blind     legally blind  . Hyperlipidemia   . DDD (degenerative disc disease), lumbar   . Sciatica   . Family history of anesthesia complication     cousin with nausea and vomiting post anesthesia  . Stroke 3/15  . Carotid artery occlusion     Social History History  Substance Use Topics  . Smoking status: Never Smoker   . Smokeless tobacco: Never Used  . Alcohol Use: No    Family History Family History  Problem Relation Age of Onset  . Diabetes Father   . Heart disease Father     Before age 75  . Alcohol abuse Father   . Alzheimer's disease Mother     Surgical History Past Surgical History  Procedure Laterality Date  . Tubal ligation    . Cesarean section    . Ear drum replacement 2000 Right 2000  . Tee without cardioversion N/A 06/17/2013    Procedure: TRANSESOPHAGEAL ECHOCARDIOGRAM (TEE);  Surgeon: Vesta MixerPhilip J Nahser, MD;  [facility];  Service: Cardiovascular;  Laterality: N/A;   . Tonsillectomy    . Endarterectomy Left 06/25/2013    Procedure: ENDARTERECTOMY CAROTID;  Surgeon: Larina Earthlyodd F Early, MD;  [facility];  Service: Vascular;  Laterality: Left;  . Endarterectomy Right 08/06/2013    Procedure:  Carotid Endarterectomy with hemashield patch angioplasty;  Surgeon: Larina Earthlyodd F Early, MD;  [facility];  Service: Vascular;  Laterality: Right;  . Carotid endarterectomy Left June 25, 2013    cea  . Carotid endarterectomy Right August 06, 2013    cea    No Known Allergies  Current Outpatient Prescriptions  Medication Sig Dispense Refill  . acetaminophen (TYLENOL) 500 MG tablet Take 1,000 mg by mouth at bedtime.    . Ascorbic Acid (VITAMIN C PO) Take 1 tablet by mouth every morning.    . Cholecalciferol (VITAMIN D PO) Take 1 tablet by mouth every morning.    . clopidogrel (PLAVIX) 75 MG tablet Take 1 tablet (75 mg total) by mouth daily with breakfast. 30 tablet 0  . Cyanocobalamin (B-12 PO) Take 1 tablet by mouth every morning.    . hydrochlorothiazide (MICROZIDE) 12.5 MG capsule Take 1 capsule (12.5 mg total) by mouth daily. 30 capsule 0  . hydroxypropyl methylcellulose (ISOPTO TEARS) 2.5 % ophthalmic solution Place 1 drop into both eyes 4 (four) times daily as needed for dry eyes.    . rosuvastatin (CRESTOR) 10 MG tablet Take 10 mg by mouth at  bedtime.     . mupirocin ointment (BACTROBAN) 2 % Place 1 application into the nose daily. For 5 days    . oxyCODONE-acetaminophen (PERCOCET/ROXICET) 5-325 MG per tablet Take 1 tablet by mouth every 6 (six) hours as needed. 30 tablet 0   No current facility-administered medications for this visit.    Review of Systems : See HPI for pertinent positives and negatives.  Physical Examination  Filed Vitals:   02/25/14 1324 02/25/14 1326  BP: 160/78 157/79  Pulse: 62 65  Resp: 14   Height: 5\' 5"  (1.651 m)   Weight: 123 lb (55.792 kg)    Body mass index is 20.47 kg/(m^2).  General: WDWN female in NAD GAIT:  normal Eyes: PERRLA, bilateral nystagmus Pulmonary: Non-labored, CTAB, Negative Rales, Negative rhonchi, & Negative wheezing.  Cardiac: regular Rhythm, no detected murmur.  VASCULAR EXAM Carotid Bruits Left Right   Negative Negative    Radial pulses: left is 1+ , right is 2+ palpable.      LE Pulses LEFT RIGHT   POPLITEAL not palpable  not palpable   POSTERIOR TIBIAL 2+ palpable  2+ palpable    DORSALIS PEDIS  ANTERIOR TIBIAL 2+ palpable  1+ palpable     Gastrointestinal: soft, nontender, BS WNL, no r/g, no palpated masses.  Musculoskeletal: Negative muscle atrophy/wasting. M/S 5/5 throughout except LE's ARE 4/5, Extremities without ischemic changes  Neurologic: A&O X 3; Appropriate Affect ; SENSATION ;normal;  Speech is normal CN 2-12 intact except nystagmus present, Pain and light touch intact in extremities, Motor exam as listed above.    Non-Invasive Vascular Imaging CAROTID DUPLEX 02/25/2014   CEREBROVASCULAR DUPLEX EVALUATION    INDICATION: Carotid artery disease    PREVIOUS INTERVENTION(S): Right carotid endarterectomy 08/06/2013; Left carotid endarterectomy 06/25/2013, both by Dr. Arbie CookeyEarly    DUPLEX EXAM: Carotid duplex    RIGHT  LEFT  Peak Systolic Velocities (cm/s) End Diastolic Velocities (cm/s) Plaque LOCATION Peak Systolic Velocities (cm/s) End Diastolic Velocities (cm/s) Plaque  83 19 HT CCA PROXIMAL 71 15 -  72 19 - CCA MID 90 16 -  83 24 HT CCA DISTAL 41 8 -  138 13 - ECA 199 20 -  71 18 - ICA PROXIMAL 41 15 -  80 22 - ICA MID 76 27 -  98 28 - ICA DISTAL 88 28 -    N/A ICA / CCA Ratio (PSV) N/A  Antegrade Vertebral Flow Antegrade  160 Brachial Systolic Pressure (mmHg) 160  Triphasic Brachial Artery Waveforms Triphasic    Plaque Morphology:  HM = Homogeneous, HT =  Heterogeneous, CP = Calcific Plaque, SP = Smooth Plaque, IP = Irregular Plaque  ADDITIONAL FINDINGS:     IMPRESSION: 1. Patent bilateral carotid endarterectomy sites with no evidence for restenosis.    Compared to the previous exam:  No prior exams here      Assessment: Kerri CaffeyDoris J Snyder is a 73 y.o. female who presents with asymptomatic patent bilateral carotid endarterectomy sites with no evidence for restenosis. No prior carotid Duplex exams here.  Plan: Follow-up in 6 months with Carotid Duplex according to post CEA surveillance timeline; if stable in 6 months will evaluate yearly.   I discussed in depth with the patient the nature of atherosclerosis, and emphasized the importance of maximal medical management including strict control of blood pressure, blood glucose, and lipid levels, obtaining regular exercise, and continued cessation of smoking.  The patient is aware that without maximal medical management the underlying atherosclerotic disease process will  progress, limiting the benefit of any interventions. The patient was given information about stroke prevention and what symptoms should prompt the patient to seek immediate medical care. Thank you for allowing [place] to participate in this patient's care.  Charisse March, RN, MSN, FNP-C [facility] [place] Office: [phone]  Clinic Physician: Imogene Burn  02/25/2014 1:41 PM

## 2014-03-02 ENCOUNTER — Encounter: Payer: Self-pay | Admitting: Neurology

## 2014-03-08 ENCOUNTER — Encounter: Payer: Self-pay | Admitting: Neurology

## 2014-08-26 ENCOUNTER — Ambulatory Visit: Payer: Medicare Other | Admitting: Family

## 2014-08-26 ENCOUNTER — Other Ambulatory Visit (HOSPITAL_COMMUNITY): Payer: Self-pay

## 2014-09-13 ENCOUNTER — Other Ambulatory Visit: Payer: Self-pay | Admitting: *Deleted

## 2014-09-13 ENCOUNTER — Encounter: Payer: Self-pay | Admitting: Family

## 2014-09-13 DIAGNOSIS — Z48812 Encounter for surgical aftercare following surgery on the circulatory system: Secondary | ICD-10-CM

## 2014-09-13 DIAGNOSIS — I6523 Occlusion and stenosis of bilateral carotid arteries: Secondary | ICD-10-CM

## 2014-09-16 ENCOUNTER — Ambulatory Visit (INDEPENDENT_AMBULATORY_CARE_PROVIDER_SITE_OTHER): Payer: Medicare Other | Admitting: Family

## 2014-09-16 ENCOUNTER — Ambulatory Visit (HOSPITAL_COMMUNITY)
Admission: RE | Admit: 2014-09-16 | Discharge: 2014-09-16 | Disposition: A | Discharge status: Home / Self Care | Payer: Medicare Other | Source: Ambulatory Visit | Attending: Family | Admitting: Family

## 2014-09-16 ENCOUNTER — Encounter: Payer: Self-pay | Admitting: Family

## 2014-09-16 VITALS — BP 153/71 | HR 66 | Temp 97.0°F | Resp 16 | Wt 136.9 lb

## 2014-09-16 DIAGNOSIS — Z9889 Other specified postprocedural states: Secondary | ICD-10-CM | POA: Diagnosis not present

## 2014-09-16 DIAGNOSIS — I6523 Occlusion and stenosis of bilateral carotid arteries: Secondary | ICD-10-CM | POA: Insufficient documentation

## 2014-09-16 DIAGNOSIS — Z48812 Encounter for surgical aftercare following surgery on the circulatory system: Secondary | ICD-10-CM | POA: Insufficient documentation

## 2014-09-16 NOTE — Patient Instructions (Signed)
Stroke Prevention Some medical conditions and behaviors are associated with an increased chance of having a stroke. You may prevent a stroke by making healthy choices and managing medical conditions. HOW CAN I REDUCE MY RISK OF HAVING A STROKE?   Stay physically active. Get at least 30 minutes of activity on most or all days.  Do not smoke. It may also be helpful to avoid exposure to secondhand smoke.  Limit alcohol use. Moderate alcohol use is considered to be:  No more than 2 drinks per day for men.  No more than 1 drink per day for nonpregnant women.  Eat healthy foods. This involves:  Eating 5 or more servings of fruits and vegetables a day.  Making dietary changes that address high blood pressure (hypertension), high cholesterol, diabetes, or obesity.  Manage your cholesterol levels.  Making food choices that are high in fiber and low in saturated fat, trans fat, and cholesterol may control cholesterol levels.  Take any prescribed medicines to control cholesterol as directed by your health care provider.  Manage your diabetes.  Controlling your carbohydrate and sugar intake is recommended to manage diabetes.  Take any prescribed medicines to control diabetes as directed by your health care provider.  Control your hypertension.  Making food choices that are low in salt (sodium), saturated fat, trans fat, and cholesterol is recommended to manage hypertension.  Take any prescribed medicines to control hypertension as directed by your health care provider.  Maintain a healthy weight.  Reducing calorie intake and making food choices that are low in sodium, saturated fat, trans fat, and cholesterol are recommended to manage weight.  Stop drug abuse.  Avoid taking birth control pills.  Talk to your health care provider about the risks of taking birth control pills if you are over 35 years old, smoke, get migraines, or have ever had a blood clot.  Get evaluated for sleep  disorders (sleep apnea).  Talk to your health care provider about getting a sleep evaluation if you snore a lot or have excessive sleepiness.  Take medicines only as directed by your health care provider.  For some people, aspirin or blood thinners (anticoagulants) are helpful in reducing the risk of forming abnormal blood clots that can lead to stroke. If you have the irregular heart rhythm of atrial fibrillation, you should be on a blood thinner unless there is a good reason you cannot take them.  Understand all your medicine instructions.  Make sure that other conditions (such as anemia or atherosclerosis) are addressed. SEEK IMMEDIATE MEDICAL CARE IF:   You have sudden weakness or numbness of the face, arm, or leg, especially on one side of the body.  Your face or eyelid droops to one side.  You have sudden confusion.  You have trouble speaking (aphasia) or understanding.  You have sudden trouble seeing in one or both eyes.  You have sudden trouble walking.  You have dizziness.  You have a loss of balance or coordination.  You have a sudden, severe headache with no known cause.  You have new chest pain or an irregular heartbeat. Any of these symptoms may represent a serious problem that is an emergency. Do not wait to see if the symptoms will go away. Get medical help at once. Call your local emergency services (911 in [place] Do not drive yourself to the hospital. Document Released: 05/09/2004 Document Revised: 08/16/2013 Document Reviewed: 10/02/2012 ExitCare Patient Information 2015 Exit[employer], [employer]. This information is not intended to replace advice given   to you by your health care provider. Make sure you discuss any questions you have with your health care provider.  

## 2014-09-16 NOTE — Progress Notes (Signed)
Established Carotid Patient   History of Present Illness  Kerri CaffeyDoris J Snyder is a 74 y.o. female patient of Dr. Arbie CookeyEarly who is s/p right CEA on 08/06/13 for a greater than 80% stenosis; she is also s/p left CEA on 06/25/13. She returns today for follow up. She had a small stroke on 06/14/13 as manifested by expressive aphasia for a few days, denies hemiparesis. She denies any further stroke or TIA activity. 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 Her nystagmus is associated with her albinoism. She has intermittent sciatic pain in her right hip and arthritis pain, denies claudication symptoms. Pt denies cardiac issues. She has a good appetite and denies post prandial abdominal pain.  She had recent left cataract surgery; she is now able to read the newspaper without a magnifying glass.  She states her PCP is aware of her blood pressure that stays consistent with today's blood pressure.  Pt Diabetic: No Pt smoker: non-smoker  Pt meds include: Statin : Yes ASA: No Other anticoagulants/antiplatelets: Plavix   Past Medical History  Diagnosis Date  . Blind     legally blind  . Hyperlipidemia   . DDD (degenerative disc disease), lumbar   . Sciatica   . Family history of anesthesia complication     cousin with nausea and vomiting post anesthesia  . Stroke 3/15  . Carotid artery occlusion     Social History History  Substance Use Topics  . Smoking status: Never Smoker   . Smokeless tobacco: Never Used  . Alcohol Use: No    Family History Family History  Problem Relation Age of Onset  . Diabetes Father   . Heart disease Father     Before age 260  . Alcohol abuse Father   . Alzheimer's disease Mother     Surgical History Past Surgical History  Procedure Laterality Date  . Tubal ligation    . Cesarean section    . Ear drum replacement  2000 Right 2000  . Tee without cardioversion N/A 06/17/2013    Procedure: TRANSESOPHAGEAL ECHOCARDIOGRAM (TEE);  Surgeon: Vesta MixerPhilip J Nahser, MD;  [facility];  Service: Cardiovascular;  Laterality: N/A;  . Tonsillectomy    . Endarterectomy Left 06/25/2013    Procedure: ENDARTERECTOMY CAROTID;  Surgeon: Larina Earthlyodd F Early, MD;  [facility];  Service: Vascular;  Laterality: Left;  . Endarterectomy Right 08/06/2013    Procedure:  Carotid Endarterectomy with hemashield patch angioplasty;  Surgeon: Larina Earthlyodd F Early, MD;  [facility];  Service: Vascular;  Laterality: Right;  . Carotid endarterectomy Left June 25, 2013    cea  . Carotid endarterectomy Right August 06, 2013    cea    No Known Allergies  Current Outpatient Prescriptions  Medication Sig Dispense Refill  . acetaminophen (TYLENOL) 500 MG tablet Take 1,000 mg by mouth at bedtime.    . Ascorbic Acid (VITAMIN C PO) Take 1 tablet by mouth every morning.    . Cholecalciferol (VITAMIN D PO) Take 1 tablet by mouth every morning.    . clopidogrel (PLAVIX) 75 MG tablet Take 1 tablet (75 mg total) by mouth daily with breakfast. 30 tablet 0  . Cyanocobalamin (B-12 PO) Take 1 tablet by mouth every morning.    . hydrochlorothiazide (MICROZIDE) 12.5 MG capsule Take 1 capsule (12.5 mg total) by mouth daily. 30 capsule 0  . hydroxypropyl methylcellulose (  ISOPTO TEARS) 2.5 % ophthalmic solution Place 1 drop into both eyes 4 (four) times daily as needed for dry eyes.    . mupirocin ointment (BACTROBAN) 2 % Place 1 application into the nose daily. For 5 days    . oxyCODONE-acetaminophen (PERCOCET/ROXICET) 5-325 MG per tablet Take 1 tablet by mouth every 6 (six) hours as needed. 30 tablet 0  . rosuvastatin (CRESTOR) 10 MG tablet Take 10 mg by mouth at bedtime.      No current facility-administered medications for this visit.    Review of Systems : See HPI for pertinent positives and negatives.  Physical Examination  Filed Vitals:   09/16/14  1210 09/16/14 1213  BP: 156/68 153/71  Pulse: 68 66  Temp: 97 F (36.1 C)   TempSrc: Oral   Resp: 16   Weight: 136 lb 14.4 oz (62.097 kg)   SpO2: 98%    Body mass index is 22.78 kg/(m^2).  General: WDWN female in NAD GAIT: normal Eyes: Pupils equal, bilateral nystagmus Pulmonary: Non-labored, CTAB, Negative Rales, Negative rhonchi, & Negative wheezing.  Cardiac: regular Rhythm, no detected murmur.  VASCULAR EXAM Carotid Bruits Left Right   Negative Negative    Radial pulses: left is 1+ , right is 2+ palpable.      LE Pulses LEFT RIGHT   POPLITEAL not palpable  not palpable   POSTERIOR TIBIAL 2+ palpable  2+ palpable    DORSALIS PEDIS  ANTERIOR TIBIAL 2+ palpable  1+ palpable     Gastrointestinal: soft, nontender, BS WNL, no r/g, no palpated masses.  Musculoskeletal: Negative muscle atrophy/wasting. M/S 5/5 throughout except LE's are 4/5, Extremities without ischemic changes  Neurologic: A&O X 3; Appropriate Affect,  Speech is normal CN 2-12 intact except nystagmus present, Pain and light touch intact in extremities, Motor exam as listed above.          Non-Invasive Vascular Imaging CAROTID DUPLEX 09/16/2014   CEREBROVASCULAR DUPLEX EVALUATION    INDICATION: Carotid artery disease    PREVIOUS INTERVENTION(S): Right carotid endarterectomy 08/06/2013; Left carotid endarterectomy 06/25/2013, both by Dr. Arbie Cookey    DUPLEX EXAM: Carotid duplex    RIGHT  LEFT  Peak Systolic Velocities (cm/s) End Diastolic Velocities (cm/s) Plaque LOCATION Peak Systolic Velocities (cm/s) End Diastolic Velocities (cm/s) Plaque  66 15 HT CCA PROXIMAL 50 14 -  65 15 - CCA MID 72 21 -  71 18 HT CCA DISTAL 86 10 -  105 14 HT ECA 234 33 HT  84 18 - ICA PROXIMAL 58 21 -  71 22 - ICA MID 58 23 -   71 23 - ICA DISTAL 88 29 -    N/A ICA / CCA Ratio (PSV) N/A  Antegrade Vertebral Flow Antegrade  140 Brachial Systolic Pressure (mmHg) 148  Triphasic Brachial Artery Waveforms Triphasic    Plaque Morphology:  HM = Homogeneous, HT = Heterogeneous, CP = Calcific Plaque, SP = Smooth Plaque, IP = Irregular Plaque  ADDITIONAL FINDINGS:     IMPRESSION: 1. Patent bilateral carotid endarterectomy sites with no evidence for restenosis. 2. Left ECA stenosis PSV consistent with a >50% diameter reduction.    Compared to the previous exam:  No significant changes noted when compared to previous exam of 02/25/2014.      Assessment: ADELL KOVAL is a 74 y.o. female who is s/p right CEA on 08/06/13 for a greater than 80% stenosis; she is also s/p left CEA on 06/25/13. She had a small stroke on 06/14/13 as manifested by  expressive aphasia for a few days, denies hemiparesis. She denies any further stroke or TIA activity. She is legally blind from birth, has albinoism.  Today's carotid Duplex suggests patent bilateral carotid endarterectomy sites with no evidence for restenosis. No significant changes noted when compared to previous exam of 02/25/2014.    Plan: Follow-up in 1 year with Carotid Duplex.   I discussed in depth with the patient the nature of atherosclerosis, and emphasized the importance of maximal medical management including strict control of blood pressure, blood glucose, and lipid levels, obtaining regular exercise, and continued cessation of smoking.  The patient is aware that without maximal medical management the underlying atherosclerotic disease process will progress, limiting the benefit of any interventions. The patient was given information about stroke prevention and what symptoms should prompt the patient to seek immediate medical care. Thank you for allowing [place] to participate in this patient's care.  Charisse March, RN, MSN, FNP-C [facility]  [place] Office: [phone]  Clinic Physician: Darrick Penna on call  09/16/2014 12:16 PM

## 2014-09-20 NOTE — Addendum Note (Signed)
Addended by: Adria DillELDRIDGE-LEWIS, Keano Guggenheim L on: 09/20/2014 11:13 AM   Modules accepted: Orders

## 2015-01-21 IMAGING — CT CT ANGIO NECK
1 of 8 series · 6 of 33 positions shown · IV contrast (omnipaque)
Comparison: MRI head 06/14/2013

CLINICAL DATA: Stroke.  Carotid stenosis

EXAM:
CT ANGIOGRAPHY NECK
TECHNIQUE: Multidetector CT imaging of the neck was performed using the
standard protocol during bolus administration of intravenous
contrast. Multiplanar CT image reconstructions and MIPs were
obtained to evaluate the vascular anatomy. Carotid stenosis
measurements (when applicable) are obtained utilizing NASCET
criteria, using the distal internal carotid diameter as the
denominator.
CONTRAST:  50mL OMNIPAQUE IOHEXOL 350 MG/ML SOLN

[mpr, ax 1x1 mpr, axial · axial · 0.44mm/px · z∈[+105,+267]mm · 6 of 228 slices shown]
[im 33/228  soft-tissue]
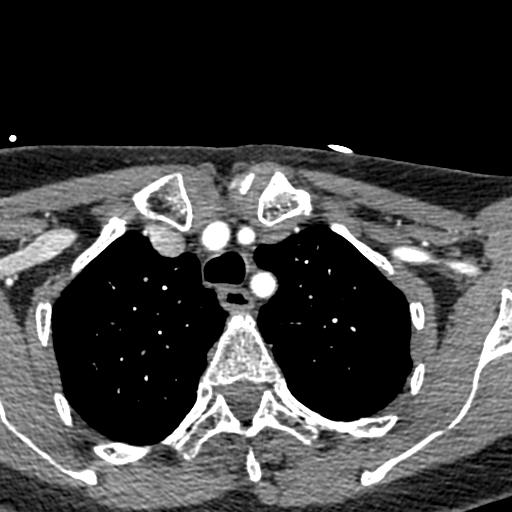
[im 65/228  bone]
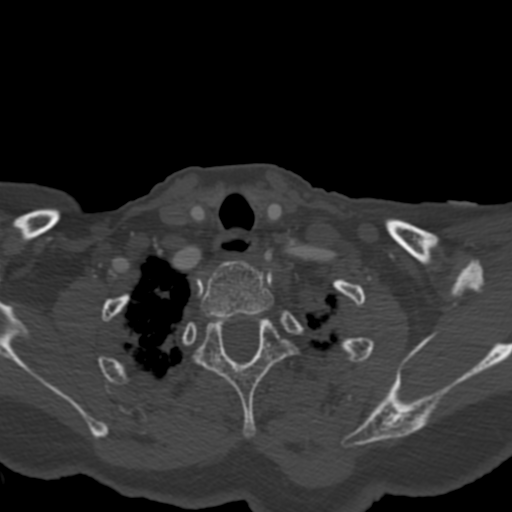
[im 98/228  soft-tissue]
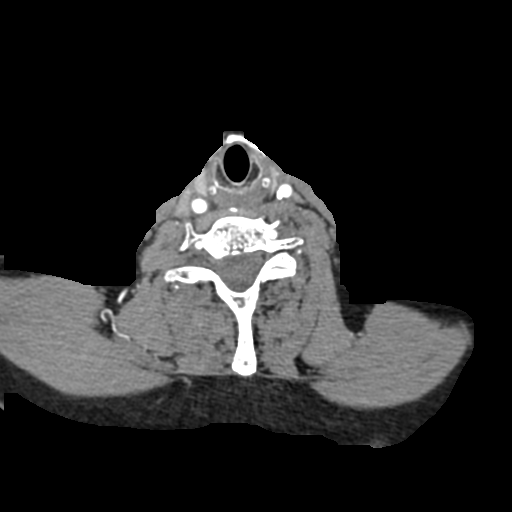
[im 130/228  bone]
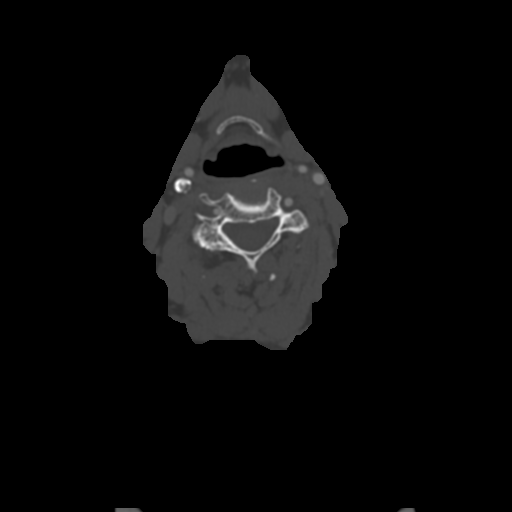
[im 163/228  soft-tissue]
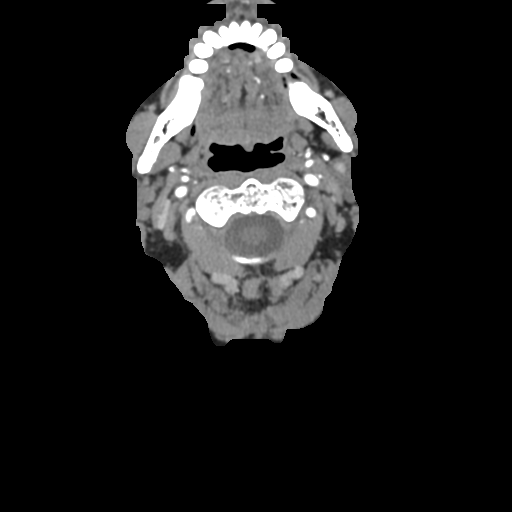
[im 195/228  bone]
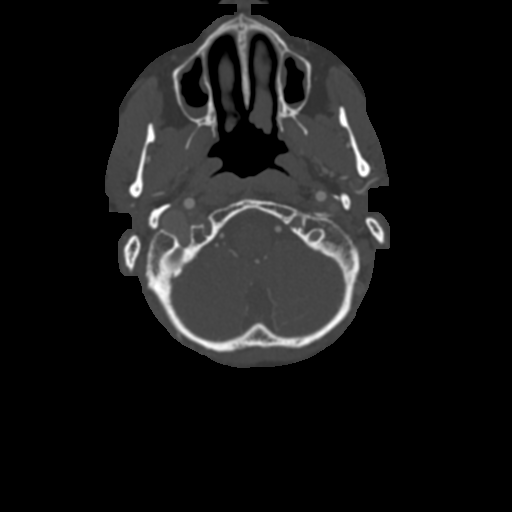

[6 of 33 positions shown; findings below may reference images not displayed]

FINDINGS: Apical lung scarring bilaterally involving the pleura and
parenchyma. Negative for mass or adenopathy in the neck. Cervical
spondylosis and facet degeneration. 2 mm anterior slip C4-5 appears
degenerative.

Standard 3 vessel aortic arch with mild atherosclerotic disease.
Proximal great vessels show mild atherosclerotic change without
significant stenosis.

Right carotid: Calcified plaque in the right common carotid artery
without significant stenosis. Circumferential calcification
involving the proximal right internal carotid artery. There is a
critical stenosis at the upper aspect of this plaque narrowing the
lumen by approximately 90% diameter stenosis. No dissection.
Remainder of the right internal carotid artery is patent to the
skullbase. Atherosclerotic calcification in the cavernous carotid is
noted. 85% diameter stenosis of the proximal right external carotid
artery.

Left carotid: Calcified plaque along the common carotid artery
without significant stenosis. Atherosclerotic plaque in the carotid
bulb narrowing the lumen by approximately 70% diameter stenosis. 70%
diameter stenosis left external carotid artery at the origin.

Both vertebral arteries are patent to the basilar without stenosis.

Air-fluid levels are noted in the maxillary sinus bilaterally. These
were also present on 06/14/2013

Review of the MIP images confirms the above findings.
IMPRESSION: Critical stenosis of the proximal right internal carotid artery.
Circumferential calcification is present within 90% stenosis at the
distal aspect of the plaque in the internal carotid artery. There is
also an 85% diameter stenosis of the proximal right external carotid
artery.

70% diameter stenosis of the proximal left internal carotid artery
and left external carotid artery.

Both vertebral arteries are patent without significant stenosis.

## 2015-06-01 ENCOUNTER — Encounter: Payer: Self-pay | Admitting: Cardiology

## 2015-07-24 ENCOUNTER — Encounter: Payer: Self-pay | Admitting: Cardiology

## 2015-09-19 ENCOUNTER — Encounter (HOSPITAL_COMMUNITY): Payer: Medicare Other

## 2015-09-19 ENCOUNTER — Ambulatory Visit: Payer: Medicare Other | Admitting: Family

## 2016-11-01 ENCOUNTER — Ambulatory Visit: Payer: Self-pay

## 2016-11-01 ENCOUNTER — Other Ambulatory Visit: Payer: Self-pay | Admitting: Occupational Medicine

## 2016-11-01 DIAGNOSIS — M25561 Pain in right knee: Secondary | ICD-10-CM

## 2016-11-01 DIAGNOSIS — M79644 Pain in right finger(s): Secondary | ICD-10-CM

## 2019-06-01 ENCOUNTER — Other Ambulatory Visit: Payer: Self-pay

## 2019-06-01 ENCOUNTER — Ambulatory Visit: Payer: Medicare Other | Attending: Internal Medicine

## 2019-06-01 DIAGNOSIS — Z23 Encounter for immunization: Secondary | ICD-10-CM

## 2019-06-01 NOTE — Progress Notes (Signed)
   Covid-19 Vaccination Clinic  Name:  ASLI TOKARSKI    MRN: [id number] DOB: 05-16-1940  06/01/2019  Ms. Gowen was observed post Covid-19 immunization for 15 minutes without incidence. She was provided with Vaccine Information Sheet and instruction to access the V-Safe system.   Ms. Dwight was instructed to call 911 with any severe reactions post vaccine: Marland Kitchen Difficulty breathing  . Swelling of your face and throat  . A fast heartbeat  . A bad rash all over your body  . Dizziness and weakness    Immunizations Administered    Name Date Dose VIS Date Route   Moderna COVID-19 Vaccine 06/01/2019 11:10 AM 0.5 mL 03/16/2019 Intramuscular   Manufacturer: Moderna   Lot: [id number]   NDC: 39767-341-93

## 2019-06-30 ENCOUNTER — Ambulatory Visit: Payer: Medicare Other | Attending: Internal Medicine

## 2019-06-30 DIAGNOSIS — Z23 Encounter for immunization: Secondary | ICD-10-CM

## 2019-06-30 NOTE — Progress Notes (Signed)
   Covid-19 Vaccination Clinic  Name:  Kerri Snyder    MRN: [id number] DOB: 04-Mar-1941  06/30/2019  [name] was observed post Covid-19 immunization for 15 minutes without incident. She was provided with Vaccine Information Sheet and instruction to access the V-Safe system.   [name] was instructed to call 911 with any severe reactions post vaccine: Marland Kitchen Difficulty breathing  . Swelling of face and throat  . A fast heartbeat  . A bad rash all over body  . Dizziness and weakness   Immunizations Administered    Name Date Dose VIS Date Route   Moderna COVID-19 Vaccine 06/30/2019 11:50 AM 0.5 mL 03/16/2019 Intramuscular   Manufacturer: Moderna   Lot: [id number]   NDC: 21031-281-18

## 2019-07-13 ENCOUNTER — Telehealth: Payer: Self-pay | Admitting: Cardiology

## 2019-07-13 NOTE — Telephone Encounter (Signed)
Patient states she is requesting to have her son, Kerri Snyder, accompany her during new patient appointment scheduled for 08/06/19 at 2:00 PM with Dr. Anne Fu due to visual impairment and requiring assistance to fill out paperwork. Please advise.

## 2019-07-13 NOTE — Telephone Encounter (Signed)
I spoke with patient. She has decreased vision and needs son to help her complete paperwork. I told her I would make note on appointment that he would accompany her.

## 2019-07-14 ENCOUNTER — Telehealth: Payer: Self-pay | Admitting: *Deleted

## 2019-07-14 NOTE — Telephone Encounter (Signed)
Left message for patient to call and schedule appointment.

## 2019-07-15 ENCOUNTER — Telehealth: Payer: Self-pay | Admitting: Cardiology

## 2019-07-15 NOTE — Telephone Encounter (Signed)
Left message for patient to call to schedule appointment with Dr. Bjorn Pippin.

## 2019-08-06 ENCOUNTER — Ambulatory Visit: Payer: Medicare Other | Admitting: Cardiology

## 2019-08-06 ENCOUNTER — Encounter: Payer: Self-pay | Admitting: Cardiology

## 2019-08-06 ENCOUNTER — Encounter (INDEPENDENT_AMBULATORY_CARE_PROVIDER_SITE_OTHER): Payer: Self-pay

## 2019-08-06 ENCOUNTER — Inpatient Hospital Stay (HOSPITAL_COMMUNITY): Admission: RE | Admit: 2019-08-06 | Payer: Medicare Other | Source: Ambulatory Visit

## 2019-08-06 ENCOUNTER — Other Ambulatory Visit: Payer: Self-pay

## 2019-08-06 VITALS — BP 128/80 | HR 99 | Ht 65.0 in | Wt 129.8 lb

## 2019-08-06 DIAGNOSIS — I200 Unstable angina: Secondary | ICD-10-CM | POA: Insufficient documentation

## 2019-08-06 DIAGNOSIS — Z8673 Personal history of transient ischemic attack (TIA), and cerebral infarction without residual deficits: Secondary | ICD-10-CM | POA: Insufficient documentation

## 2019-08-06 DIAGNOSIS — E782 Mixed hyperlipidemia: Secondary | ICD-10-CM | POA: Diagnosis not present

## 2019-08-06 DIAGNOSIS — H548 Legal blindness, as defined in USA: Secondary | ICD-10-CM | POA: Diagnosis not present

## 2019-08-06 DIAGNOSIS — R9431 Abnormal electrocardiogram [ECG] [EKG]: Secondary | ICD-10-CM | POA: Diagnosis not present

## 2019-08-06 DIAGNOSIS — Z01812 Encounter for preprocedural laboratory examination: Secondary | ICD-10-CM

## 2019-08-06 LAB — CBC
Hematocrit: 40.8 % (ref 34.0–46.6)
Hemoglobin: 13.7 g/dL (ref 11.1–15.9)
MCH: 29.5 pg (ref 26.6–33.0)
MCHC: 33.6 g/dL (ref 31.5–35.7)
MCV: 88 fL (ref 79–97)
Platelets: 242 10*3/uL (ref 150–450)
RBC: 4.65 x10E6/uL (ref 3.77–5.28)
RDW: 14 % (ref 11.7–15.4)
WBC: 7.1 10*3/uL (ref 3.4–10.8)

## 2019-08-06 LAB — BASIC METABOLIC PANEL
BUN/Creatinine Ratio: 28 (ref 12–28)
BUN: 22 mg/dL (ref 8–27)
CO2: 27 mmol/L (ref 20–29)
Calcium: 10.6 mg/dL — ABNORMAL HIGH (ref 8.7–10.3)
Chloride: 102 mmol/L (ref 96–106)
Creatinine, Ser: 0.78 mg/dL (ref 0.57–1.00)
GFR calc Af Amer: 83 mL/min/{1.73_m2} (ref >59)
GFR calc non Af Amer: 72 mL/min/{1.73_m2} (ref >59)
Glucose: 88 mg/dL (ref 65–99)
Potassium: 3.8 mmol/L (ref 3.5–5.2)
Sodium: 136 mmol/L (ref 134–144)

## 2019-08-06 NOTE — Patient Instructions (Signed)
Medication Instructions:  The current medical regimen is effective;  continue present plan and medications.  *If you need a refill on your cardiac medications before your next appointment, please call your pharmacy*  Lab Work: Please have blood work today (BMP,CBC). If you have labs (blood work) drawn today and your tests are completely normal, you will receive your results only by: Marland Kitchen MyChart Message (if you have MyChart) OR . A paper copy in the mail If you have any lab test that is abnormal or we need to change your treatment, we will call you to review the results.  Testing/Procedures: Your physician has requested that you have a cardiac catheterization. Cardiac catheterization is used to diagnose and/or treat various heart conditions. Doctors may recommend this procedure for a number of different reasons. The most common reason is to evaluate chest pain. Chest pain can be a symptom of coronary artery disease (CAD), and cardiac catheterization can show whether plaque is narrowing or blocking your heart's arteries. This procedure is also used to evaluate the valves, as well as measure the blood flow and oxygen levels in different parts of your heart. For further information please visit [email or web address] Please follow instruction sheet, as given.  Follow-Up: At [facility], you and your health needs are our priority.  As part of our continuing mission to provide you with exceptional heart care, we have created designated Provider Care Teams.  These Care Teams include your primary Cardiologist (physician) and Advanced Practice Providers (APPs -  Physician Assistants and Nurse Practitioners) who all work together to provide you with the care you need, when you need it.  We recommend signing up for the patient portal called "MyChart".  Sign up information is provided on this After Visit Summary.  MyChart is used to connect with patients for Virtual Visits (Telemedicine).  Patients are able to  view lab/test results, encounter notes, upcoming appointments, etc.  Non-urgent messages can be sent to your provider as well.   To learn more about what you can do with MyChart, go to [email or web address].    Your next appointment:   2 week(s)  The format for your next appointment:   In Person  Provider:   Donato Schultz, MD  Thank you for choosing [facility]!!      [facility] CARDIOVASCULAR DIVISION [facility] [address] OFFICE [address] [place] [address] Dept: [phone] Loc: [phone]  Kerri Snyder  08/06/2019  You are scheduled for a Cardiac Cath on Tuesday August 10, 2019 with Dr .Eldridge Dace.  1. Please arrive at the [facility] (Main Entrance A) at [facility]: [address] [place], [place] [address] at  9:00 AM (two hours before your procedure to ensure your preparation). Free valet parking service is available.   Special note: Every effort is made to have your procedure done on time. Please understand that emergencies sometimes delay scheduled procedures.  2. Diet: Nothing after midnight.  3. Labs: Please have blood work today   4. Medication instructions in preparation for your procedure: Please hold your Hydrochlorothiazide this morning.  On the morning of your procedure, take your Plavix and any morning medicines NOT listed above.  You may use sips of water.  5. Plan for one night stay--bring personal belongings. 6. Bring a current list of your medications and current insurance cards. 7. You MUST have a responsible person to drive you home. 8. Someone MUST be with you the first 24 hours  after you arrive home or your discharge will be delayed. 9. Please wear clothes that are easy to get on and off and wear slip-on shoes.  Thank you for allowing [place] to care for you!   -- [place] [facility]

## 2019-08-06 NOTE — H&P (View-Only) (Signed)
Cardiology Office Note:    Date:  08/06/2019   ID:  Kerri Snyder, DOB 11/06/1940, MRN [id number]  PCP:  Carol Ada, MD  Cardiologist:  No primary care provider on file.  Electrophysiologist:  None   Referring MD: Carol Ada, MD     History of Present Illness:    Kerri Snyder is a 79 y.o. female here for the evaluation of exertional angina and abnormal EKG at the request of Dr. Carol Ada.  She has been experiencing discomfort with heavy exertion with jaw/throat and arm pain.Mild SOB. When going up hill from bus stop.  No CP. Still works for for [employer] severe.  Was given nitroglycerin just in case.  This has been ongoing.  EKG performed showed inferolateral T wave changes that appear ischemic.  Her prior EKG in 2015 did not show these changes.  Mom-pacer, dad-heart problems, brother-had MI all had heart issues.   She has never smoked.  No diabetes.  Does have a history of hyperlipidemia hypertension.  Has been on Plavix secondary to prior PFO noted on echocardiogram in 2015.  Bilateral carotid endarterectomies done 2015.  She has had trouble with some of the statins in the past such as Crestor causing leg pain and stomach upset and Livalo causing severe leg cramps.  She has had a stroke-taking Plavix.  Stroke was in 2015 with acute left basal ganglia infarct.  Past Medical History:  Diagnosis Date  . Albinism ([place])   . Anxiety disorder, unspecified   . Blind    legally blind  . Carotid artery occlusion   . Cerebrovascular disease, unspecified   . DDD (degenerative disc disease), lumbar   . Diverticulitis large intestine w/o perforation or abscess w/o bleeding   . Exercise-induced angina (HCC)   . Family history of anesthesia complication    cousin with nausea and vomiting post anesthesia  . First degree hemorrhoids   . Hereditary and idiopathic neuropathy, unspecified   . Hyperlipidemia   . Hypertension   . Osteopenia   . PFO (patent foramen ovale)    . Sciatica   . Stable angina (HCC)   . Stroke [facility]) 3/15    Past Surgical History:  Procedure Laterality Date  . CAROTID ENDARTERECTOMY Left June 25, 2013   cea  . CAROTID ENDARTERECTOMY Right August 06, 2013   cea  . CESAREAN SECTION    . Ear drum replacement 2000 Right 2000  . ENDARTERECTOMY Left 06/25/2013   Procedure: ENDARTERECTOMY CAROTID;  Surgeon: Rosetta Posner, MD;  Location: [place];  Service: Vascular;  Laterality: Left;  . ENDARTERECTOMY Right 08/06/2013   Procedure:  Carotid Endarterectomy with hemashield patch angioplasty;  Surgeon: Rosetta Posner, MD;  Location: [place];  Service: Vascular;  Laterality: Right;  . TEE WITHOUT CARDIOVERSION N/A 06/17/2013   Procedure: TRANSESOPHAGEAL ECHOCARDIOGRAM (TEE);  Surgeon: Thayer Headings, MD;  Location: [place];  Service: Cardiovascular;  Laterality: N/A;  . TONSILLECTOMY    . TUBAL LIGATION      Current Medications: Current Meds  Medication Sig  . ALPRAZolam (XANAX) 0.25 MG tablet Take 0.25 mg by mouth 2 (two) times daily.  . Ascorbic Acid (VITAMIN C PO) Take 1 tablet by mouth every morning.  . Cholecalciferol (VITAMIN D PO) Take 1 tablet by mouth every morning.  . clopidogrel (PLAVIX) 75 MG tablet Take 1 tablet (75 mg total) by mouth daily with breakfast.  . Coenzyme Q10 (CO Q 10 PO) Take 1 tablet by mouth daily.  Marland Kitchen  Cyanocobalamin (B-12 PO) Take 1 tablet by mouth every morning.  . hydrochlorothiazide (HYDRODIURIL) 25 MG tablet Take 25 mg by mouth daily.  . hydroxypropyl methylcellulose (ISOPTO TEARS) 2.5 % ophthalmic solution Place 1 drop into both eyes 4 (four) times daily as needed for dry eyes.  . Ibuprofen (ADVIL PO) Take 2 tablets by mouth at bedtime.  . nitroGLYCERIN (NITROSTAT) 0.4 MG SL tablet TAKE ONE TAB UNDER THE TONGUE FOR CHEST PAIN AND CAN REPEAT UP TO 3 TIMES 5 MINUTES APART AS NEEDED  . rosuvastatin (CRESTOR) 20 MG tablet Take 20 mg by mouth 3 (three) times a week.     Allergies:   Patient has no known  allergies.   Social History   Socioeconomic History  . Marital status: Married    Spouse name: Not on file  . Number of children: Not on file  . Years of education: Not on file  . Highest education level: Not on file  Occupational History  . Not on file  Tobacco Use  . Smoking status: Never Smoker  . Smokeless tobacco: Never Used  Substance and Sexual Activity  . Alcohol use: No    Alcohol/week: 0.0 standard drinks  . Drug use: No  . Sexual activity: Never  Other Topics Concern  . Not on file  Social History Narrative  . Not on file   Social Determinants of Health   Financial Resource Strain:   . Difficulty of Paying Living Expenses:   Food Insecurity:   . Worried About Programme researcher, broadcasting/film/video in the Last Year:   . Barista in the Last Year:   Transportation Needs:   . Freight forwarder (Medical):   Marland Kitchen Lack of Transportation (Non-Medical):   Physical Activity:   . Days of Exercise per Week:   . Minutes of Exercise per Session:   Stress:   . Feeling of Stress :   Social Connections:   . Frequency of Communication with Friends and Family:   . Frequency of Social Gatherings with Friends and Family:   . Attends Religious Services:   . Active Member of Clubs or Organizations:   . Attends Banker Meetings:   Marland Kitchen Marital Status:      Family History: The patient's family history includes Alcohol abuse in her father; Alzheimer's disease in her mother; Diabetes in her father; Heart disease in her father.  ROS:   Please see the history of present illness.    Overall no fevers chills nausea vomiting syncope bleeding all other systems reviewed and are negative.  EKGs/Labs/Other Studies Reviewed:    The following studies were reviewed today: Prior echo office note from Dr. Katrinka Blazing, lab work, vascular study reviewed-patent endarterectomy  EKG:  EKG is  ordered today.  The ekg ordered today demonstrates sinus rhythm T wave inversion  inferolaterally      Recent Labs: No results found for requested labs within last 8760 hours.  Recent Lipid Panel    Component Value Date/Time   CHOL 176 06/15/2013 0250   TRIG 64 06/15/2013 0250   HDL 59 06/15/2013 0250   CHOLHDL 3.0 06/15/2013 0250   VLDL 13 06/15/2013 0250   LDLCALC 104 (H) 06/15/2013 0250    Physical Exam:    VS:  BP 128/80   Pulse 99   Ht 5\' 5"  (1.651 m)   Wt 129 lb 12.8 oz (58.9 kg)   SpO2 95%   BMI 21.60 kg/m     Wt Readings from Last  3 Encounters:  08/06/19 129 lb 12.8 oz (58.9 kg)  09/16/14 136 lb 14.4 oz (62.1 kg)  02/25/14 123 lb (55.8 kg)     GEN:  Well nourished, well developed in no acute distress HEENT: Legally blind NECK: No JVD; No carotid bruits LYMPHATICS: No lymphadenopathy CARDIAC: RRR, no murmurs, rubs, gallops bounding radial pulse RESPIRATORY:  Clear to auscultation without rales, wheezing or rhonchi  ABDOMEN: Soft, non-tender, non-distended MUSCULOSKELETAL:  No edema; No deformity  SKIN: Warm and dry NEUROLOGIC:  Alert and oriented x 3 PSYCHIATRIC:  Normal affect   ASSESSMENT:    1. Unstable angina (HCC)   2. Nonspecific abnormal electrocardiogram (ECG) (EKG)   3. Mixed hyperlipidemia   4. Legally blind   5. History of stroke   6. Pre-procedure lab exam    PLAN:    In order of problems listed above:  Exertional angina with abnormal EKG concerning for ischemia -We will go ahead and set her up for heart catheterization.  Risks and benefits of been explained including stroke heart attack death renal impairment bleeding.  She is willing to proceed. 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  She is very eager to get back to work as soon as possible.  She can perform jobs that are very low in activity level.  Mixed hyperlipidemia -She is able to tolerate the Crestor 20 mg 3 times a week.  She does have some leg pain associated with this that is mild.  She is continuing to push through.  She states.  Prior stroke  2015 -Status post bilateral carotid endarterectomy -On Plavix -Small PFO on TEE.  Medically managed.  Legally blind -She was here by herself in the office setting she is able to get around with assistance.  She can still see shapes etc.  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  Medication Adjustments/Labs and Tests Ordered: Current medicines are reviewed at length with the patient today.  Concerns regarding medicines are outlined above.  Orders Placed This Encounter  Procedures  . CBC  . Basic metabolic panel  . EKG 12-Lead   No orders of the defined types were placed in this encounter.   Patient Instructions  Medication Instructions:  The current medical regimen is effective;  continue present plan and medications.  *If you need a refill on your cardiac medications before your next appointment, please call your pharmacy*  Lab Work: Please have blood work today (BMP,CBC). If you have labs (blood work) drawn today and your tests are completely normal, you will receive your results only by: Marland Kitchen MyChart Message (if you have MyChart) OR . A paper copy in the mail If you have any lab test that is abnormal or we need to change your treatment, we will call you to review the results.  Testing/Procedures: Your physician has requested that you have a cardiac catheterization. Cardiac catheterization is used to diagnose and/or treat various heart conditions. Doctors may recommend this procedure for a number of different reasons. The most common reason is to evaluate chest pain. Chest pain can be a symptom of coronary artery disease (CAD), and cardiac catheterization can show whether plaque is narrowing or blocking your heart's arteries. This procedure is also used to evaluate the valves, as well as measure the blood flow and oxygen levels in different parts of your heart. For further information please visit  [email or web address] Please follow instruction sheet, as given.  Follow-Up: At [facility], you and your health needs are our priority.  As part of our continuing mission to provide you with exceptional heart care, we have created designated Provider Care Teams.  These Care Teams include your primary Cardiologist (physician) and Advanced Practice Providers (APPs -  Physician Assistants and Nurse Practitioners) who all work together to provide you with the care you need, when you need it.  We recommend signing up for the patient portal called "MyChart".  Sign up information is provided on this After Visit Summary.  MyChart is used to connect with patients for Virtual Visits (Telemedicine).  Patients are able to view lab/test results, encounter notes, upcoming appointments, etc.  Non-urgent messages can be sent to your provider as well.   To learn more about what you can do with MyChart, go to [email or web address].    Your next appointment:   2 week(s)  The format for your next appointment:   In Person  Provider:   Donato Schultz, MD  Thank you for choosing [facility]!!      [facility] CARDIOVASCULAR DIVISION [facility] ST OFFICE [address] [place] [address] Dept: [phone] Loc: [phone]  ROLLANDE THURSBY  08/06/2019  You are scheduled for a Cardiac Cath on Tuesday August 10, 2019 with Dr .Eldridge Dace.  1. Please arrive at the [facility] (Main Entrance A) at [facility]: [address] [place], [place] [address] at  9:00 AM (two hours before your procedure to ensure your preparation). Free valet parking service is available.   Special note: Every effort is made to have your procedure done on time. Please understand that emergencies sometimes delay scheduled procedures.  2. Diet: Nothing after midnight.  3. Labs: Please have blood work today   4. Medication instructions in preparation for your  procedure: Please hold your Hydrochlorothiazide this morning.  On the morning of your procedure, take your Plavix and any morning medicines NOT listed above.  You may use sips of water.  5. Plan for one night stay--bring personal belongings. 6. Bring a current list of your medications and current insurance cards. 7. You MUST have a responsible person to drive you home. 8. Someone MUST be with you the first 24 hours after you arrive home or your discharge will be delayed. 9. Please wear clothes that are easy to get on and off and wear slip-on shoes.  Thank you for allowing [place] to care for you!   -- [facility] Invasive Cardiovascular services     Signed, Donato Schultz, MD  08/06/2019 3:24 PM    [facility]

## 2019-08-06 NOTE — Progress Notes (Signed)
Cardiology Office Note:    Date:  08/06/2019   ID:  Kerri Snyder, DOB 11/06/1940, MRN [id number]  PCP:  Carol Ada, MD  Cardiologist:  No primary care provider on file.  Electrophysiologist:  None   Referring MD: Carol Ada, MD     History of Present Illness:    Kerri Snyder is a 79 y.o. female here for the evaluation of exertional angina and abnormal EKG at the request of Dr. Carol Ada.  She has been experiencing discomfort with heavy exertion with jaw/throat and arm pain.Mild SOB. When going up hill from bus stop.  No CP. Still works for for [employer] severe.  Was given nitroglycerin just in case.  This has been ongoing.  EKG performed showed inferolateral T wave changes that appear ischemic.  Her prior EKG in 2015 did not show these changes.  Mom-pacer, dad-heart problems, brother-had MI all had heart issues.   She has never smoked.  No diabetes.  Does have a history of hyperlipidemia hypertension.  Has been on Plavix secondary to prior PFO noted on echocardiogram in 2015.  Bilateral carotid endarterectomies done 2015.  She has had trouble with some of the statins in the past such as Crestor causing leg pain and stomach upset and Livalo causing severe leg cramps.  She has had a stroke-taking Plavix.  Stroke was in 2015 with acute left basal ganglia infarct.  Past Medical History:  Diagnosis Date  . Albinism ([place])   . Anxiety disorder, unspecified   . Blind    legally blind  . Carotid artery occlusion   . Cerebrovascular disease, unspecified   . DDD (degenerative disc disease), lumbar   . Diverticulitis large intestine w/o perforation or abscess w/o bleeding   . Exercise-induced angina (HCC)   . Family history of anesthesia complication    cousin with nausea and vomiting post anesthesia  . First degree hemorrhoids   . Hereditary and idiopathic neuropathy, unspecified   . Hyperlipidemia   . Hypertension   . Osteopenia   . PFO (patent foramen ovale)    . Sciatica   . Stable angina (HCC)   . Stroke [facility]) 3/15    Past Surgical History:  Procedure Laterality Date  . CAROTID ENDARTERECTOMY Left June 25, 2013   cea  . CAROTID ENDARTERECTOMY Right August 06, 2013   cea  . CESAREAN SECTION    . Ear drum replacement 2000 Right 2000  . ENDARTERECTOMY Left 06/25/2013   Procedure: ENDARTERECTOMY CAROTID;  Surgeon: Rosetta Posner, MD;  Location: [place];  Service: Vascular;  Laterality: Left;  . ENDARTERECTOMY Right 08/06/2013   Procedure:  Carotid Endarterectomy with hemashield patch angioplasty;  Surgeon: Rosetta Posner, MD;  Location: [place];  Service: Vascular;  Laterality: Right;  . TEE WITHOUT CARDIOVERSION N/A 06/17/2013   Procedure: TRANSESOPHAGEAL ECHOCARDIOGRAM (TEE);  Surgeon: Thayer Headings, MD;  Location: [place];  Service: Cardiovascular;  Laterality: N/A;  . TONSILLECTOMY    . TUBAL LIGATION      Current Medications: Current Meds  Medication Sig  . ALPRAZolam (XANAX) 0.25 MG tablet Take 0.25 mg by mouth 2 (two) times daily.  . Ascorbic Acid (VITAMIN C PO) Take 1 tablet by mouth every morning.  . Cholecalciferol (VITAMIN D PO) Take 1 tablet by mouth every morning.  . clopidogrel (PLAVIX) 75 MG tablet Take 1 tablet (75 mg total) by mouth daily with breakfast.  . Coenzyme Q10 (CO Q 10 PO) Take 1 tablet by mouth daily.  Marland Kitchen  Cyanocobalamin (B-12 PO) Take 1 tablet by mouth every morning.  . hydrochlorothiazide (HYDRODIURIL) 25 MG tablet Take 25 mg by mouth daily.  . hydroxypropyl methylcellulose (ISOPTO TEARS) 2.5 % ophthalmic solution Place 1 drop into both eyes 4 (four) times daily as needed for dry eyes.  . Ibuprofen (ADVIL PO) Take 2 tablets by mouth at bedtime.  . nitroGLYCERIN (NITROSTAT) 0.4 MG SL tablet TAKE ONE TAB UNDER THE TONGUE FOR CHEST PAIN AND CAN REPEAT UP TO 3 TIMES 5 MINUTES APART AS NEEDED  . rosuvastatin (CRESTOR) 20 MG tablet Take 20 mg by mouth 3 (three) times a week.     Allergies:   Patient has no known  allergies.   Social History   Socioeconomic History  . Marital status: Married    Spouse name: Not on file  . Number of children: Not on file  . Years of education: Not on file  . Highest education level: Not on file  Occupational History  . Not on file  Tobacco Use  . Smoking status: Never Smoker  . Smokeless tobacco: Never Used  Substance and Sexual Activity  . Alcohol use: No    Alcohol/week: 0.0 standard drinks  . Drug use: No  . Sexual activity: Never  Other Topics Concern  . Not on file  Social History Narrative  . Not on file   Social Determinants of Health   Financial Resource Strain:   . Difficulty of Paying Living Expenses:   Food Insecurity:   . Worried About Running Out of Food in the Last Year:   . Ran Out of Food in the Last Year:   Transportation Needs:   . Lack of Transportation (Medical):   . Lack of Transportation (Non-Medical):   Physical Activity:   . Days of Exercise per Week:   . Minutes of Exercise per Session:   Stress:   . Feeling of Stress :   Social Connections:   . Frequency of Communication with Friends and Family:   . Frequency of Social Gatherings with Friends and Family:   . Attends Religious Services:   . Active Member of Clubs or Organizations:   . Attends Club or Organization Meetings:   . Marital Status:      Family History: The patient's family history includes Alcohol abuse in her father; Alzheimer's disease in her mother; Diabetes in her father; Heart disease in her father.  ROS:   Please see the history of present illness.    Overall no fevers chills nausea vomiting syncope bleeding all other systems reviewed and are negative.  EKGs/Labs/Other Studies Reviewed:    The following studies were reviewed today: Prior echo office note from Dr. Smith, lab work, vascular study reviewed-patent endarterectomy  EKG:  EKG is  ordered today.  The ekg ordered today demonstrates sinus rhythm T wave inversion  inferolaterally      Recent Labs: No results found for requested labs within last 8760 hours.  Recent Lipid Panel    Component Value Date/Time   CHOL 176 06/15/2013 0250   TRIG 64 06/15/2013 0250   HDL 59 06/15/2013 0250   CHOLHDL 3.0 06/15/2013 0250   VLDL 13 06/15/2013 0250   LDLCALC 104 (H) 06/15/2013 0250    Physical Exam:    VS:  BP 128/80   Pulse 99   Ht 5' 5" (1.651 m)   Wt 129 lb 12.8 oz (58.9 kg)   SpO2 95%   BMI 21.60 kg/m     Wt Readings from Last   3 Encounters:  08/06/19 129 lb 12.8 oz (58.9 kg)  09/16/14 136 lb 14.4 oz (62.1 kg)  02/25/14 123 lb (55.8 kg)     GEN:  Well nourished, well developed in no acute distress HEENT: Legally blind NECK: No JVD; No carotid bruits LYMPHATICS: No lymphadenopathy CARDIAC: RRR, no murmurs, rubs, gallops bounding radial pulse RESPIRATORY:  Clear to auscultation without rales, wheezing or rhonchi  ABDOMEN: Soft, non-tender, non-distended MUSCULOSKELETAL:  No edema; No deformity  SKIN: Warm and dry NEUROLOGIC:  Alert and oriented x 3 PSYCHIATRIC:  Normal affect   ASSESSMENT:    1. Unstable angina (HCC)   2. Nonspecific abnormal electrocardiogram (ECG) (EKG)   3. Mixed hyperlipidemia   4. Legally blind   5. History of stroke   6. Pre-procedure lab exam    PLAN:    In order of problems listed above:  Exertional angina with abnormal EKG concerning for ischemia -We will go ahead and set her up for heart catheterization.  Risks and benefits of been explained including stroke heart attack death renal impairment bleeding.  She is willing to proceed. 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  She is very eager to get back to work as soon as possible.  She can perform jobs that are very low in activity level.  Mixed hyperlipidemia -She is able to tolerate the Crestor 20 mg 3 times a week.  She does have some leg pain associated with this that is mild.  She is continuing to push through.  She states.  Prior stroke  2015 -Status post bilateral carotid endarterectomy -On Plavix -Small PFO on TEE.  Medically managed.  Legally blind -She was here by herself in the office setting she is able to get around with assistance.  She can still see shapes etc.  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  Medication Adjustments/Labs and Tests Ordered: Current medicines are reviewed at length with the patient today.  Concerns regarding medicines are outlined above.  Orders Placed This Encounter  Procedures  . CBC  . Basic metabolic panel  . EKG 12-Lead   No orders of the defined types were placed in this encounter.   Patient Instructions  Medication Instructions:  The current medical regimen is effective;  continue present plan and medications.  *If you need a refill on your cardiac medications before your next appointment, please call your pharmacy*  Lab Work: Please have blood work today (BMP,CBC). If you have labs (blood work) drawn today and your tests are completely normal, you will receive your results only by: Marland Kitchen MyChart Message (if you have MyChart) OR . A paper copy in the mail If you have any lab test that is abnormal or we need to change your treatment, we will call you to review the results.  Testing/Procedures: Your physician has requested that you have a cardiac catheterization. Cardiac catheterization is used to diagnose and/or treat various heart conditions. Doctors may recommend this procedure for a number of different reasons. The most common reason is to evaluate chest pain. Chest pain can be a symptom of coronary artery disease (CAD), and cardiac catheterization can show whether plaque is narrowing or blocking your heart's arteries. This procedure is also used to evaluate the valves, as well as measure the blood flow and oxygen levels in different parts of your heart. For further information please visit  [email or web address] Please follow instruction sheet, as given.  Follow-Up: At [facility], you and your health needs are our priority.  As part of our continuing mission to provide you with exceptional heart care, we have created designated Provider Care Teams.  These Care Teams include your primary Cardiologist (physician) and Advanced Practice Providers (APPs -  Physician Assistants and Nurse Practitioners) who all work together to provide you with the care you need, when you need it.  We recommend signing up for the patient portal called "MyChart".  Sign up information is provided on this After Visit Summary.  MyChart is used to connect with patients for Virtual Visits (Telemedicine).  Patients are able to view lab/test results, encounter notes, upcoming appointments, etc.  Non-urgent messages can be sent to your provider as well.   To learn more about what you can do with MyChart, go to [email or web address].    Your next appointment:   2 week(s)  The format for your next appointment:   In Person  Provider:   Donato Schultz, MD  Thank you for choosing [facility]!!      [facility] CARDIOVASCULAR DIVISION [facility] ST OFFICE [address] [place] [address] Dept: [phone] Loc: [phone]  Kerri Snyder  08/06/2019  You are scheduled for a Cardiac Cath on Tuesday August 10, 2019 with Dr .Eldridge Dace.  1. Please arrive at the [facility] (Main Entrance A) at [facility]: [address] [place], [place] [address] at  9:00 AM (two hours before your procedure to ensure your preparation). Free valet parking service is available.   Special note: Every effort is made to have your procedure done on time. Please understand that emergencies sometimes delay scheduled procedures.  2. Diet: Nothing after midnight.  3. Labs: Please have blood work today   4. Medication instructions in preparation for your  procedure: Please hold your Hydrochlorothiazide this morning.  On the morning of your procedure, take your Plavix and any morning medicines NOT listed above.  You may use sips of water.  5. Plan for one night stay--bring personal belongings. 6. Bring a current list of your medications and current insurance cards. 7. You MUST have a responsible person to drive you home. 8. Someone MUST be with you the first 24 hours after you arrive home or your discharge will be delayed. 9. Please wear clothes that are easy to get on and off and wear slip-on shoes.  Thank you for allowing [place] to care for you!   -- [facility] Invasive Cardiovascular services     Signed, Donato Schultz, MD  08/06/2019 3:24 PM    [facility]

## 2019-08-07 ENCOUNTER — Other Ambulatory Visit (HOSPITAL_COMMUNITY)
Admission: RE | Admit: 2019-08-07 | Discharge: 2019-08-07 | Disposition: A | Discharge status: Home / Self Care | Payer: Medicare Other | Source: Ambulatory Visit | Attending: Interventional Cardiology | Admitting: Interventional Cardiology

## 2019-08-07 DIAGNOSIS — Z20822 Contact with and (suspected) exposure to covid-19: Secondary | ICD-10-CM | POA: Insufficient documentation

## 2019-08-07 DIAGNOSIS — Z01812 Encounter for preprocedural laboratory examination: Secondary | ICD-10-CM | POA: Insufficient documentation

## 2019-08-08 LAB — SARS CORONAVIRUS 2 (TAT 6-24 HRS): SARS Coronavirus 2: NEGATIVE

## 2019-08-09 ENCOUNTER — Telehealth: Payer: Self-pay | Admitting: *Deleted

## 2019-08-09 NOTE — Telephone Encounter (Signed)
Call to patient to review procedure instructions-no answer.

## 2019-08-09 NOTE — Telephone Encounter (Signed)
Pt contacted pre-catheterization scheduled at [facility] for: Tuesday August 10, 2019 11 AM Verified arrival time and place: [facility] Main Entrance A [facility]) at: 9 AM   No solid food after midnight prior to cath, clear liquids until 5 AM day of procedure.  Hold: HCTZ- AM of procedure  Except hold medications AM meds can be  taken pre-cath with sip of water including: ASA 81 mg Plavix 75 mg   Confirmed patient has responsible adult to drive home post procedure and observe 24 hours after arriving home:   You are allowed ONE visitor in the waiting room during your procedures. Both you and your visitor must wear masks.  LMTCB for pt to review procedure instructions

## 2019-08-09 NOTE — Telephone Encounter (Signed)
Spoke with the patient and reviewed the following pre cath instructions with her:   Pt contacted pre-catheterization scheduled at [facility] for: Tuesday August 10, 2019 11 AM Verified arrival time and place: [facility] Main Entrance A [facility]) at: 9 AM   No solid food after midnight prior to cath, clear liquids until 5 AM day of procedure.  Hold: HCTZ- AM of procedure  Except hold medications AM meds can be  taken pre-cath with sip of water including: ASA 81 mg Plavix 75 mg   Confirmed patient has responsible adult to drive home post procedure and observe 24 hours after arriving home:   You are allowed ONE visitor in the waiting room during your procedures. Both you and your visitor must wear masks.   Pt verbalized understanding and is aware of the above instructions. All questions have been answered.

## 2019-08-10 ENCOUNTER — Ambulatory Visit (HOSPITAL_COMMUNITY)
Admission: RE | Disposition: A | Discharge status: Home / Self Care | Payer: Self-pay | Source: Home / Self Care | Attending: Interventional Cardiology

## 2019-08-10 ENCOUNTER — Other Ambulatory Visit: Payer: Self-pay

## 2019-08-10 ENCOUNTER — Ambulatory Visit (HOSPITAL_COMMUNITY)
Admission: RE | Admit: 2019-08-10 | Discharge: 2019-08-11 | Disposition: A | Discharge status: Home / Self Care | Payer: Medicare Other | Attending: Interventional Cardiology | Admitting: Interventional Cardiology

## 2019-08-10 DIAGNOSIS — Z8673 Personal history of transient ischemic attack (TIA), and cerebral infarction without residual deficits: Secondary | ICD-10-CM | POA: Insufficient documentation

## 2019-08-10 DIAGNOSIS — F419 Anxiety disorder, unspecified: Secondary | ICD-10-CM | POA: Diagnosis not present

## 2019-08-10 DIAGNOSIS — Z955 Presence of coronary angioplasty implant and graft: Secondary | ICD-10-CM

## 2019-08-10 DIAGNOSIS — Z7902 Long term (current) use of antithrombotics/antiplatelets: Secondary | ICD-10-CM | POA: Diagnosis not present

## 2019-08-10 DIAGNOSIS — E782 Mixed hyperlipidemia: Secondary | ICD-10-CM | POA: Diagnosis present

## 2019-08-10 DIAGNOSIS — M8580 Other specified disorders of bone density and structure, unspecified site: Secondary | ICD-10-CM | POA: Insufficient documentation

## 2019-08-10 DIAGNOSIS — Z8249 Family history of ischemic heart disease and other diseases of the circulatory system: Secondary | ICD-10-CM | POA: Insufficient documentation

## 2019-08-10 DIAGNOSIS — Q211 Atrial septal defect: Secondary | ICD-10-CM | POA: Insufficient documentation

## 2019-08-10 DIAGNOSIS — I200 Unstable angina: Secondary | ICD-10-CM | POA: Diagnosis present

## 2019-08-10 DIAGNOSIS — I10 Essential (primary) hypertension: Secondary | ICD-10-CM | POA: Diagnosis not present

## 2019-08-10 DIAGNOSIS — I6529 Occlusion and stenosis of unspecified carotid artery: Secondary | ICD-10-CM | POA: Diagnosis not present

## 2019-08-10 DIAGNOSIS — Z79899 Other long term (current) drug therapy: Secondary | ICD-10-CM | POA: Insufficient documentation

## 2019-08-10 DIAGNOSIS — H548 Legal blindness, as defined in USA: Secondary | ICD-10-CM | POA: Insufficient documentation

## 2019-08-10 DIAGNOSIS — R9431 Abnormal electrocardiogram [ECG] [EKG]: Secondary | ICD-10-CM | POA: Insufficient documentation

## 2019-08-10 DIAGNOSIS — I2584 Coronary atherosclerosis due to calcified coronary lesion: Secondary | ICD-10-CM | POA: Insufficient documentation

## 2019-08-10 DIAGNOSIS — I25118 Atherosclerotic heart disease of native coronary artery with other forms of angina pectoris: Secondary | ICD-10-CM | POA: Insufficient documentation

## 2019-08-10 DIAGNOSIS — I2510 Atherosclerotic heart disease of native coronary artery without angina pectoris: Secondary | ICD-10-CM | POA: Diagnosis present

## 2019-08-10 DIAGNOSIS — Z9861 Coronary angioplasty status: Secondary | ICD-10-CM

## 2019-08-10 HISTORY — PX: CORONARY STENT INTERVENTION: CATH118234

## 2019-08-10 HISTORY — PX: LEFT HEART CATH AND CORONARY ANGIOGRAPHY: CATH118249

## 2019-08-10 HISTORY — PX: INTRAVASCULAR ULTRASOUND/IVUS: CATH118244

## 2019-08-10 LAB — POCT ACTIVATED CLOTTING TIME
Activated Clotting Time: 263 seconds
Activated Clotting Time: 263 seconds
Activated Clotting Time: 422 seconds
Activated Clotting Time: 230 seconds
Activated Clotting Time: 180 seconds
Activated Clotting Time: 191 seconds

## 2019-08-10 SURGERY — LEFT HEART CATH AND CORONARY ANGIOGRAPHY
Anesthesia: LOCAL

## 2019-08-10 MED ORDER — SODIUM CHLORIDE 0.9% FLUSH: 3.0000 mL | INTRAVENOUS | Status: DC | PRN

## 2019-08-10 MED ORDER — MIDAZOLAM HCL 2 MG/2ML IJ SOLN
INTRAMUSCULAR | Status: AC
  Filled 2019-08-10: qty 2

## 2019-08-10 MED ORDER — SODIUM CHLORIDE 0.9 % WEIGHT BASED INFUSION
3.0000 mL/kg/h | INTRAVENOUS | Status: DC
  Administered 2019-08-10: 3 mL/kg/h via INTRAVENOUS

## 2019-08-10 MED ORDER — MIDAZOLAM HCL 2 MG/2ML IJ SOLN
INTRAMUSCULAR | Status: DC | PRN
  Administered 2019-08-10 (×3): 1 mg via INTRAVENOUS

## 2019-08-10 MED ORDER — ASPIRIN 81 MG PO CHEW: 81.0000 mg | CHEWABLE_TABLET | ORAL | Status: AC

## 2019-08-10 MED ORDER — HEPARIN SODIUM (PORCINE) 1000 UNIT/ML IJ SOLN
INTRAMUSCULAR | Status: AC
  Filled 2019-08-10: qty 1

## 2019-08-10 MED ORDER — IOHEXOL 350 MG/ML SOLN
INTRAVENOUS | Status: DC | PRN
  Administered 2019-08-10: 145 mL

## 2019-08-10 MED ORDER — FENTANYL CITRATE (PF) 100 MCG/2ML IJ SOLN
INTRAMUSCULAR | Status: DC | PRN
  Administered 2019-08-10 (×3): 25 ug via INTRAVENOUS

## 2019-08-10 MED ORDER — NITROGLYCERIN 1 MG/10 ML FOR IR/CATH LAB
INTRA_ARTERIAL | Status: DC | PRN
  Administered 2019-08-10 (×3): 200 ug via INTRACORONARY

## 2019-08-10 MED ORDER — SODIUM CHLORIDE 0.9 % IV SOLN
INTRAVENOUS | Status: AC | PRN
  Administered 2019-08-10: 500 mL via INTRAVENOUS

## 2019-08-10 MED ORDER — ONDANSETRON HCL 4 MG/2ML IJ SOLN: 4.0000 mg | Freq: Four times a day (QID) | INTRAMUSCULAR | Status: DC | PRN

## 2019-08-10 MED ORDER — SODIUM CHLORIDE 0.9 % IV SOLN: 250.0000 mL | INTRAVENOUS | Status: DC | PRN

## 2019-08-10 MED ORDER — NITROGLYCERIN 1 MG/10 ML FOR IR/CATH LAB
INTRA_ARTERIAL | Status: AC
  Filled 2019-08-10: qty 10

## 2019-08-10 MED ORDER — VERAPAMIL HCL 2.5 MG/ML IV SOLN
INTRAVENOUS | Status: AC
  Filled 2019-08-10: qty 2

## 2019-08-10 MED ORDER — SODIUM CHLORIDE 0.9 % IV SOLN: INTRAVENOUS | Status: AC

## 2019-08-10 MED ORDER — NITROGLYCERIN 0.4 MG SL SUBL: 0.4000 mg | SUBLINGUAL_TABLET | SUBLINGUAL | Status: DC | PRN

## 2019-08-10 MED ORDER — NOREPINEPHRINE 4 MG/250ML-% IV SOLN
INTRAVENOUS | Status: AC
  Filled 2019-08-10: qty 250

## 2019-08-10 MED ORDER — HEPARIN (PORCINE) IN NACL 1000-0.9 UT/500ML-% IV SOLN
INTRAVENOUS | Status: DC | PRN
  Administered 2019-08-10 (×2): 500 mL

## 2019-08-10 MED ORDER — ROSUVASTATIN CALCIUM 20 MG PO TABS
20.0000 mg | ORAL_TABLET | ORAL | Status: DC
  Administered 2019-08-11: 12:00:00 20 mg via ORAL
  Filled 2019-08-10: qty 1

## 2019-08-10 MED ORDER — HEPARIN (PORCINE) IN NACL 1000-0.9 UT/500ML-% IV SOLN
INTRAVENOUS | Status: AC
  Filled 2019-08-10: qty 1000

## 2019-08-10 MED ORDER — FENTANYL CITRATE (PF) 100 MCG/2ML IJ SOLN
INTRAMUSCULAR | Status: AC
  Filled 2019-08-10: qty 2

## 2019-08-10 MED ORDER — HEPARIN SODIUM (PORCINE) 1000 UNIT/ML IJ SOLN
INTRAMUSCULAR | Status: DC | PRN
  Administered 2019-08-10: 6000 [IU] via INTRAVENOUS
  Administered 2019-08-10: 4000 [IU] via INTRAVENOUS
  Administered 2019-08-10: 3000 [IU] via INTRAVENOUS

## 2019-08-10 MED ORDER — LABETALOL HCL 5 MG/ML IV SOLN: 10.0000 mg | INTRAVENOUS | Status: AC | PRN

## 2019-08-10 MED ORDER — LIDOCAINE HCL (PF) 1 % IJ SOLN
INTRAMUSCULAR | Status: DC | PRN
  Administered 2019-08-10: 12 mL

## 2019-08-10 MED ORDER — ASPIRIN 81 MG PO CHEW
CHEWABLE_TABLET | ORAL | Status: AC
  Administered 2019-08-10: 81 mg via ORAL
  Filled 2019-08-10: qty 1

## 2019-08-10 MED ORDER — HYDRALAZINE HCL 20 MG/ML IJ SOLN: 10.0000 mg | INTRAMUSCULAR | Status: AC | PRN

## 2019-08-10 MED ORDER — SODIUM CHLORIDE 0.9% FLUSH: 3.0000 mL | Freq: Two times a day (BID) | INTRAVENOUS | Status: DC

## 2019-08-10 MED ORDER — ACETAMINOPHEN 325 MG PO TABS: 650.0000 mg | ORAL_TABLET | ORAL | Status: DC | PRN

## 2019-08-10 MED ORDER — CLOPIDOGREL BISULFATE 75 MG PO TABS: 75.0000 mg | ORAL_TABLET | Freq: Every day | ORAL | Status: DC

## 2019-08-10 MED ORDER — HYPROMELLOSE (GONIOSCOPIC) 2.5 % OP SOLN
1.0000 [drp] | Freq: Four times a day (QID) | OPHTHALMIC | Status: DC | PRN
  Filled 2019-08-10: qty 15

## 2019-08-10 MED ORDER — CLOPIDOGREL BISULFATE 75 MG PO TABS
75.0000 mg | ORAL_TABLET | Freq: Every day | ORAL | Status: DC
  Administered 2019-08-11: 75 mg via ORAL
  Filled 2019-08-10: qty 1

## 2019-08-10 MED ORDER — SODIUM CHLORIDE 0.9% FLUSH
3.0000 mL | Freq: Two times a day (BID) | INTRAVENOUS | Status: DC
  Administered 2019-08-11: 3 mL via INTRAVENOUS

## 2019-08-10 MED ORDER — ASPIRIN 81 MG PO CHEW
81.0000 mg | CHEWABLE_TABLET | Freq: Every day | ORAL | Status: DC
  Administered 2019-08-11: 81 mg via ORAL
  Filled 2019-08-10: qty 1

## 2019-08-10 MED ORDER — LIDOCAINE HCL (PF) 1 % IJ SOLN
INTRAMUSCULAR | Status: AC
  Filled 2019-08-10: qty 30

## 2019-08-10 MED ORDER — SODIUM CHLORIDE 0.9 % WEIGHT BASED INFUSION: 1.0000 mL/kg/h | INTRAVENOUS | Status: DC

## 2019-08-10 SURGICAL SUPPLY — 26 items
BALLN SAPPHIRE 2.25X20 (BALLOONS) ×2
BALLN SAPPHIRE ~~LOC~~ 3.0X18 (BALLOONS) ×2 IMPLANT
BALLOON SAPPHIRE 2.25X20 (BALLOONS) ×1 IMPLANT
CATH INFINITI 5FR MULTPACK ANG (CATHETERS) ×2 IMPLANT
CATH LAUNCHER 6FR EBU 3.75 (CATHETERS) ×2 IMPLANT
CATH OPTICROSS HD (CATHETERS) ×2 IMPLANT
CROWN DIAMONDBACK CLASSIC 1.25 (BURR) ×2 IMPLANT
GLIDESHEATH SLEND SS 6F .021 (SHEATH) ×2 IMPLANT
GUIDEWIRE INQWIRE 1.5J.035X260 (WIRE) ×1 IMPLANT
INQWIRE 1.5J .035X260CM (WIRE) ×2
KIT ENCORE 26 ADVANTAGE (KITS) ×2 IMPLANT
KIT HEART LEFT (KITS) ×2 IMPLANT
KIT HEMO VALVE WATCHDOG (MISCELLANEOUS) ×2 IMPLANT
LUBRICANT VIPERSLIDE CORONARY (MISCELLANEOUS) ×2 IMPLANT
PACK CARDIAC CATHETERIZATION (CUSTOM PROCEDURE TRAY) ×2 IMPLANT
SHEATH PINNACLE 5F 10CM (SHEATH) ×2 IMPLANT
SHEATH PINNACLE 6F 10CM (SHEATH) ×2 IMPLANT
SHEATH PROBE COVER 6X72 (BAG) ×2 IMPLANT
SLED PULL BACK IVUS (MISCELLANEOUS) ×2 IMPLANT
STENT SYNERGY XD 2.50X28 (Permanent Stent) ×1 IMPLANT
SYNERGY XD 2.50X28 (Permanent Stent) ×2 IMPLANT
TRANSDUCER W/STOPCOCK (MISCELLANEOUS) ×2 IMPLANT
TUBING CIL FLEX 10 FLL-RA (TUBING) ×2 IMPLANT
WIRE ASAHI PROWATER 180CM (WIRE) ×2 IMPLANT
WIRE EMERALD 3MM-J .035X150CM (WIRE) ×2 IMPLANT
WIRE VIPERWIRE COR FLEX .012 (WIRE) ×2 IMPLANT

## 2019-08-10 NOTE — Interval H&P Note (Signed)
Cath Lab Visit (complete for each Cath Lab visit)  Clinical Evaluation Leading to the Procedure:   ACS: No.  Non-ACS:    Anginal Classification: CCS III  Anti-ischemic medical therapy: Maximal Therapy (2 or more classes of medications)  Non-Invasive Test Results: No non-invasive testing performed  Prior CABG: No previous CABG      History and Physical Interval Note:  08/10/2019 10:43 AM  Kerri Snyder  has presented today for surgery, with the diagnosis of Angina.  The various methods of treatment have been discussed with the patient and family. After consideration of risks, benefits and other options for treatment, the patient has consented to  Procedure(s): LEFT HEART CATH AND CORONARY ANGIOGRAPHY (N/A) as a surgical intervention.  The patient's history has been reviewed, patient examined, no change in status, stable for surgery.  I have reviewed the patient's chart and labs.  Questions were answered to the patient's satisfaction.     Lance Muss

## 2019-08-10 NOTE — Progress Notes (Signed)
Site area: right groin  Site Prior to Removal:  Level 0  Pressure Applied For 20 MINUTES    Minutes Beginning at 1745  Manual:   Yes.    Patient Status During Pull:  stable  Post Pull Groin Site:  Level 0  Post Pull Instructions Given:  Yes.    Post Pull Pulses Present:  Yes.    Dressing Applied:  Yes.    Comments:  Pulled by Lavone Nian, RN

## 2019-08-11 DIAGNOSIS — Z9861 Coronary angioplasty status: Secondary | ICD-10-CM

## 2019-08-11 DIAGNOSIS — R9431 Abnormal electrocardiogram [ECG] [EKG]: Secondary | ICD-10-CM | POA: Diagnosis not present

## 2019-08-11 DIAGNOSIS — I10 Essential (primary) hypertension: Secondary | ICD-10-CM | POA: Diagnosis not present

## 2019-08-11 DIAGNOSIS — I2510 Atherosclerotic heart disease of native coronary artery without angina pectoris: Secondary | ICD-10-CM

## 2019-08-11 DIAGNOSIS — I2584 Coronary atherosclerosis due to calcified coronary lesion: Secondary | ICD-10-CM | POA: Diagnosis not present

## 2019-08-11 DIAGNOSIS — I25118 Atherosclerotic heart disease of native coronary artery with other forms of angina pectoris: Secondary | ICD-10-CM | POA: Diagnosis not present

## 2019-08-11 LAB — CBC
HCT: 38.3 % (ref 36.0–46.0)
Hemoglobin: 12.6 g/dL (ref 12.0–15.0)
MCH: 29.1 pg (ref 26.0–34.0)
MCHC: 32.9 g/dL (ref 30.0–36.0)
MCV: 88.5 fL (ref 80.0–100.0)
Platelets: 205 10*3/uL (ref 150–400)
RBC: 4.33 MIL/uL (ref 3.87–5.11)
RDW: 13.3 % (ref 11.5–15.5)
WBC: 7.8 10*3/uL (ref 4.0–10.5)
nRBC: 0 % (ref 0.0–0.2)

## 2019-08-11 LAB — BASIC METABOLIC PANEL
Anion gap: 9 (ref 5–15)
BUN: 10 mg/dL (ref 8–23)
CO2: 25 mmol/L (ref 22–32)
Calcium: 9.2 mg/dL (ref 8.9–10.3)
Chloride: 105 mmol/L (ref 98–111)
Creatinine, Ser: 0.67 mg/dL (ref 0.44–1.00)
GFR calc Af Amer: >60 mL/min (ref >60)
GFR calc non Af Amer: >60 mL/min (ref >60)
Glucose, Bld: 100 mg/dL — ABNORMAL HIGH (ref 70–99)
Potassium: 3.5 mmol/L (ref 3.5–5.1)
Sodium: 139 mmol/L (ref 135–145)

## 2019-08-11 MED ORDER — METOPROLOL TARTRATE 25 MG PO TABS
12.5000 mg | ORAL_TABLET | Freq: Two times a day (BID) | ORAL | 2 refills | Status: DC
Start: 2019-08-11 — End: 2022-08-13

## 2019-08-11 MED ORDER — CARVEDILOL 3.125 MG PO TABS: 3.1250 mg | ORAL_TABLET | Freq: Two times a day (BID) | ORAL | Status: DC

## 2019-08-11 MED ORDER — ASPIRIN 81 MG PO CHEW: 81.0000 mg | CHEWABLE_TABLET | Freq: Every day | ORAL | Status: DC

## 2019-08-11 MED FILL — Verapamil HCl IV Soln 2.5 MG/ML: INTRAVENOUS | Qty: 2 | Status: AC

## 2019-08-11 MED FILL — Norepinephrine-Dextrose IV Solution 4 MG/250ML-5%: INTRAVENOUS | Qty: 250 | Status: AC

## 2019-08-11 MED FILL — METOPROLOL TARTRATE 25 MG T: 25 | 90 days supply | Qty: 90 | Fill #0

## 2019-08-11 NOTE — Progress Notes (Signed)
CARDIAC REHAB PHASE I   PRE:  Rate/Rhythm: 90 SR  BP:  Supine: 116/69  Sitting:   Standing:    SaO2: 96%RA  MODE:  Ambulation: 470 ft   POST:  Rate/Rhythm: 120 ST   82 SR with rest  BP:  Supine:   Sitting: 125/59  Standing:    SaO2: 99%RA 0919-1013 Pt walked 470 ft on RA with hand held asst.  No CP. Tolerated well. Discussed importance of plavix with stent. Reviewed NTG use, gave heart healthy diet, and discussed CRP 2. Referral made to [place]. Pt knows she cannot attend until after staged procedure. Pt encouraged light walking and no heavy lifting due to groin stick.    Luetta Nutting, RN BSN  08/11/2019 10:04 AM

## 2019-08-11 NOTE — Plan of Care (Signed)
Min assist with adls 

## 2019-08-11 NOTE — Progress Notes (Addendum)
Progress Note  Patient Name: Kerri Snyder Date of Encounter: 08/11/2019  Primary Cardiologist: Dr. Marlou Porch, MD   Subjective   Feeling well. No recurrent symptoms. Groin site stable   Inpatient Medications    Scheduled Meds: . aspirin  81 mg Oral Daily  . clopidogrel  75 mg Oral Q breakfast  . rosuvastatin  20 mg Oral Once per day on Mon Wed Fri  . sodium chloride flush  3 mL Intravenous Q12H   Continuous Infusions: . sodium chloride     PRN Meds: sodium chloride, acetaminophen, hydroxypropyl methylcellulose / hypromellose, nitroGLYCERIN, ondansetron (ZOFRAN) IV, sodium chloride flush   Vital Signs    Vitals:   08/10/19 2030 08/10/19 2100 08/10/19 2300 08/11/19 0400  BP: (!) 105/55 (!) 105/57 (!) 102/56 118/65  Pulse: 87 95 86 74  Resp: 18 18  18   Temp:  98 F (36.7 C) 98.2 F (36.8 C) 97.8 F (36.6 C)  TempSrc:  Oral Oral Oral  SpO2: 95% 97% 95% 94%  Weight:    60.1 kg  Height:        Intake/Output Summary (Last 24 hours) at 08/11/2019 0858 Last data filed at 08/11/2019 0500 Gross per 24 hour  Intake 610 ml  Output 1100 ml  Net -490 ml   Filed Weights   08/10/19 0935 08/11/19 0400  Weight: 57.6 kg 60.1 kg    Physical Exam   General: Well developed, well nourished, NAD Neck: Negative for carotid bruits. No JVD Lungs:Clear to ausculation bilaterally. Breathing is unlabored. Cardiovascular: RRR with S1 S2. + murmur Abdomen: Soft, non-tender, non-distended. No obvious abdominal masses. Extremities: No edema. Radial pulses 2+ bilaterally Neuro: Alert and oriented. No focal deficits. No facial asymmetry. MAE spontaneously. Psych: Responds to questions appropriately with normal affect.    Labs    Chemistry Recent Labs  Lab 08/06/19 1507 08/11/19 0453  NA 136 139  K 3.8 3.5  CL 102 105  CO2 27 25  GLUCOSE 88 100*  BUN 22 10  CREATININE 0.78 0.67  CALCIUM 10.6* 9.2  GFRNONAA 72 >60  GFRAA 83 >60  ANIONGAP  --  9     Hematology Recent  Labs  Lab 08/06/19 1507 08/11/19 0453  WBC 7.1 7.8  RBC 4.65 4.33  HGB 13.7 12.6  HCT 40.8 38.3  MCV 88 88.5  MCH 29.5 29.1  MCHC 33.6 32.9  RDW 14.0 13.3  PLT 242 205    Cardiac EnzymesNo results for input(s): TROPONINI in the last 168 hours. No results for input(s): TROPIPOC in the last 168 hours.   BNPNo results for input(s): BNP, PROBNP in the last 168 hours.   DDimer No results for input(s): DDIMER in the last 168 hours.   Radiology    CARDIAC CATHETERIZATION  Result Date: 08/10/2019  Mid RCA lesion is 99% stenosed. There were left to right collaterals feeding the distal RCA.  Mid LAD lesion is 90% stenosed.  After orbital atherectomy, a drug-eluting stent was successfully placed using a SYNERGY XD 2.50X28, postdialted to 3.0 mm.  Post intervention, there is a 0% residual stenosis.  There is mild left ventricular systolic dysfunction.  The left ventricular ejection fraction is 45-50% by visual estimate.  LV end diastolic pressure is normal.  There is no aortic valve stenosis.  Continue aggressive secondary prevention including aspirin and Plavix.  We will plan to bring her back for atherectomy of the RCA at a later time.  Would likely wait a couple of weeks.  We will watch her overnight since we had to use right groin for access site.   Telemetry    08/11/19 NSR- Personally Reviewed  ECG    No new tracing as of 08/11/19- Personally Reviewed  Cardiac Studies   LHC 08/10/19:   Mid RCA lesion is 99% stenosed. There were left to right collaterals feeding the distal RCA.  Mid LAD lesion is 90% stenosed.  After orbital atherectomy, a drug-eluting stent was successfully placed using a SYNERGY XD 2.50X28, postdialted to 3.0 mm.  Post intervention, there is a 0% residual stenosis.  There is mild left ventricular systolic dysfunction.  The left ventricular ejection fraction is 45-50% by visual estimate.  LV end diastolic pressure is normal.  There is no aortic  valve stenosis.   Continue aggressive secondary prevention including aspirin and Plavix.  We will plan to bring her back for atherectomy of the RCA at a later time.  Would likely wait a couple of weeks.  We will watch her overnight since we had to use right groin for access site.  Patient Profile     79 y.o. female with a hx of HLD, HTN, carotid artery disease, CVA, blindness and PFO who was sent for elective cath for abnormal EKG and exertional angina.   Assessment & Plan    1. CAD s/p PCI to mLAD: -Found to have 99% stenosis of dRCA and 90% mLAD>>>PCI/DES with arthrectomy to mLAD with plans for RCA lesion at a later time  -Continue secondary prevention with ASA, Plavix  -Right gorin used for access therefore pt was kept overnight>>groin site stable today  -Denies recurrent chest pain   2. Mixed hyperlipidemia: -Will need updated Lipid panel  -Repeat lipid, LFTs  -Able to tolerate the Crestor 20 mg 3 times a week. -May need referral to lipid clinic if unable to tolerate high doses   3. Hx of CVA 2015: -Status post bilateral carotid endarterectomy -Continue Plavix  -Had small PFO on TEE>>treat medically   4. Legally blind: -Works at [employer] for [employer] return to [employer] duty work   Medical illustrator, Georgie Chard NP-C [facility] Pager: [phone] 08/11/2019, 8:58 AM     For questions or updates, please contact   Please consult [email or web address] for contact info under Cardiology/STEMI.   Attending Note:   The patient was seen and examined.  Agree with assessment and plan as noted above.  Changes made to the above note as needed.  Patient seen and independently examined with  Ledon Snare, NP .   We discussed all aspects of the encounter. I agree with the assessment and plan as stated above.  1.   CAD.   She is s/p atherectomy of her LAD .   Has collateral filling of her RCA .   There are tenative plans for PCI of her occluded RCA   She is doing well .  Groin is  stable Home today  Back to work on May 10 , To see Dr. Anne Fu on may 11.  Already on ASA and plavix    I have spent a total of 40 minutes with patient reviewing hospital  notes , telemetry, EKGs, labs and examining patient as well as establishing an assessment and plan that was discussed with the patient. > 50% of time was spent in direct patient care.    Vesta Mixer, Montez Hageman., MD, [facility] 08/11/2019, 11:13 AM [address]  [address] Office [phone] Pager [phone]

## 2019-08-11 NOTE — Discharge Summary (Addendum)
Discharge Summary    Patient ID: Kerri Snyder MRN: [id number]; DOB: 1941/02/20  Admit date: 08/10/2019 Discharge date: 08/11/2019  Primary Care Provider: Merri Brunette, MD  Primary Cardiologist: Dr. Anne Fu, MD  Discharge Diagnoses    Principal Problem:   CAD S/P percutaneous coronary angioplasty Active Problems:   Mixed hyperlipidemia   Unstable angina (HCC)   CAD (coronary artery disease), native coronary artery  Diagnostic Studies/Procedures   LHC 08/10/19:   Mid RCA lesion is 99% stenosed. There were left to right collaterals feeding the distal RCA.  Mid LAD lesion is 90% stenosed.  After orbital atherectomy, a drug-eluting stent was successfully placed using a SYNERGY XD 2.50X28, postdialted to 3.0 mm.  Post intervention, there is a 0% residual stenosis.  There is mild left ventricular systolic dysfunction.  The left ventricular ejection fraction is 45-50% by visual estimate.  LV end diastolic pressure is normal.  There is no aortic valve stenosis.  Continue aggressive secondary prevention including aspirin and Plavix. We will plan to bring her back for atherectomy of the RCA at a later time. Would likely wait a couple of weeks.  History of Present Illness     Kerri Snyder is a 79 y.o. female with a hx of HLD, HTN, carotid artery disease, CVA, blindness and PFO who was sent for elective cath for abnormal EKG and exertional angina.   Kerri Snyder was seen by Dr. Anne Snyder 08/06/19 with reported chest discomfort with associated jaw and arm pain and associated shortness of breath.  Symptoms were mostly occurring with exertion while walking uphill from her by stopped going to work (works at [employer] for [employer]). EKG performed which showed inferolateral T wave changes that appear ischemic when compared to her prior EKG in 2015.   She has no history of DM2, tobacco use however does have CV risk factors of hyperlipidemia and hypertension and has been on Plavix  secondary to prior PFO noted on echocardiogram from 2015.  Underwent bilateral carotid endarterectomies done 2015.  She has issues with statin intolerance in the past causing leg and stomach pain.  Continues to take Plavix secondary to CVA.    Hospital Course     LHC performed 08/10/2019 which showed mid RCA lesion with 99% stenosis with left-to-right collaterals, mid LAD lesion at 90% stenosis with orbital arthrectomy and DES placement.  Plans are to continue with secondary prevention with ASA and Plavix with plans for [facility] return for RCA arthrectomy at a later time, likely in several weeks.  Right groin access with no signs of bleeding or hematoma, site stable.  She had no recurrent chest pain symptoms.  Post cath lab work stable with a creatinine of 0.67, hemoglobin at 12.6.  Hospital issues include:  CAD s/p PCI to mLAD: -Found to have 99% stenosis of dRCA and 90% mLAD>>>PCI/DES with arthrectomy to mLAD with plans for RCA lesion at a later time  -Continue secondary prevention with ASA, Plavix  -Right gorin used for access therefore pt was kept overnight>>groin site stable today  -Denies recurrent chest pain   Mixed hyperlipidemia: -Will need updated Lipid panel  -Repeat lipid, LFTs  -Able to tolerate the Crestor 20 mg 3 times a week. -May need referral to lipid clinic if unable to tolerate high doses   Hx of CVA 2015: -Status post bilateral carotid endarterectomy -Continue Plavix  -Had small PFO on TEE>>treat medically   Legally blind: -Works at [employer] for the [employer] return to [employer] duty work  Pharmacologist: None  The patient was seen and examined by Dr. Acie Fredrickson who feels that she is stable and ready for discharge today, 08/11/2019.  Did the patient have an acute coronary syndrome (MI, NSTEMI, STEMI, etc) this admission?:  Yes                               AHA/ACC Clinical Performance & Quality Measures: 1. Aspirin prescribed? - Yes 2. ADP Receptor Inhibitor  (Plavix/Clopidogrel, Brilinta/Ticagrelor or Effient/Prasugrel) prescribed (includes medically managed patients)? - Yes 3. Beta Blocker prescribed? - Yes 4. High Intensity Statin (Lipitor 40-80mg  or Crestor 20-40mg ) prescribed? - No - intolerance  5. EF assessed during THIS hospitalization? - Yes 6. For EF <40%, was ACEI/ARB prescribed? - Not Applicable (EF >/= 60%) 7. For EF <40%, Aldosterone Antagonist (Spironolactone or Eplerenone) prescribed? - Not Applicable (EF >/= 45%) 8. Cardiac Rehab Phase II ordered (Included Medically managed Patients)? - Yes   _____________  Discharge Vitals Blood pressure 118/65, pulse 74, temperature 97.8 F (36.6 C), temperature source Oral, resp. rate 18, height 5\' 5"  (1.651 m), weight 60.1 kg, SpO2 94 %.  Filed Weights   08/10/19 0935 08/11/19 0400  Weight: 57.6 kg 60.1 kg   Labs & Radiologic Studies    CBC Recent Labs    08/11/19 0453  WBC 7.8  HGB 12.6  HCT 38.3  MCV 88.5  PLT 409   Basic Metabolic Panel Recent Labs    08/11/19 0453  NA 139  K 3.5  CL 105  CO2 25  GLUCOSE 100*  BUN 10  CREATININE 0.67  CALCIUM 9.2   Liver Function Tests No results for input(s): AST, ALT, ALKPHOS, BILITOT, PROT, ALBUMIN in the last 72 hours. No results for input(s): LIPASE, AMYLASE in the last 72 hours. High Sensitivity Troponin:   No results for input(s): TROPONINIHS in the last 720 hours.  BNP Invalid input(s): POCBNP D-Dimer No results for input(s): DDIMER in the last 72 hours. Hemoglobin A1C No results for input(s): HGBA1C in the last 72 hours. Fasting Lipid Panel No results for input(s): CHOL, HDL, LDLCALC, TRIG, CHOLHDL, LDLDIRECT in the last 72 hours. Thyroid Function Tests No results for input(s): TSH, T4TOTAL, T3FREE, THYROIDAB in the last 72 hours.  Invalid input(s): FREET3 _____________  CARDIAC CATHETERIZATION  Result Date: 08/10/2019  Mid RCA lesion is 99% stenosed. There were left to right collaterals feeding the distal  RCA.  Mid LAD lesion is 90% stenosed.  After orbital atherectomy, a drug-eluting stent was successfully placed using a SYNERGY XD 2.50X28, postdialted to 3.0 mm.  Post intervention, there is a 0% residual stenosis.  There is mild left ventricular systolic dysfunction.  The left ventricular ejection fraction is 45-50% by visual estimate.  LV end diastolic pressure is normal.  There is no aortic valve stenosis.  Continue aggressive secondary prevention including aspirin and Plavix.  We will plan to bring her back for atherectomy of the RCA at a later time.  Would likely wait a couple of weeks. We will watch her overnight since we had to use right groin for access site.   Disposition   Pt is being discharged home today in good condition.  Follow-up Plans & Appointments   Follow-up Information    Jerline Pain, MD Follow up on 08/23/2019.   Specialty: Cardiology Why: at 340pm  Contact information: [place]. [address] [place] [address] [phone]          Discharge  Instructions    Amb Referral to Cardiac Rehabilitation   Complete by: As directed    Diagnosis: Coronary Stents   After initial evaluation and assessments completed: Virtual Based Care may be provided alone or in conjunction with Phase 2 Cardiac Rehab based on patient barriers.: Yes   Call MD for:  difficulty breathing, headache or visual disturbances   Complete by: As directed    Call MD for:  extreme fatigue   Complete by: As directed    Call MD for:  hives   Complete by: As directed    Call MD for:  persistant dizziness or light-headedness   Complete by: As directed    Call MD for:  persistant nausea and vomiting   Complete by: As directed    Call MD for:  redness, tenderness, or signs of infection (pain, swelling, redness, odor or green/yellow discharge around incision site)   Complete by: As directed    Call MD for:  severe uncontrolled pain   Complete by: As directed    Call MD for:   temperature >100.4   Complete by: As directed    Diet - low sodium heart healthy   Complete by: As directed    Discharge instructions   Complete by: As directed    No driving for 5 days. No lifting over 5 lbs for 1 week. No sexual activity for 1 week. You may return to work on 08/23/19. Keep procedure site clean & dry. If you notice increased pain, swelling, bleeding or pus, call/return!  You may shower, but no soaking baths/hot tubs/pools for 1 week.   PLEASE DO NOT MISS ANY DOSES OF YOUR PLAVIX!!!!! Also keep a log of you blood pressures and bring back to your follow up appt. Please call the office with any questions.   Patients taking blood thinners should generally stay away from medicines like ibuprofen, Advil, Motrin, naproxen, and Aleve due to risk of stomach bleeding. You may take Tylenol as directed or talk to your primary doctor about alternatives.   Increase activity slowly   Complete by: As directed       Discharge Medications   Allergies as of 08/11/2019   No Known Allergies     Medication List    STOP taking these medications   Advil 200 MG tablet Generic drug: ibuprofen   hydrochlorothiazide 25 MG tablet Commonly known as: HYDRODIURIL     TAKE these medications   ALPRAZolam 0.25 MG tablet Commonly known as: XANAX Take 0.25 mg by mouth daily as needed for anxiety.   aspirin 81 MG chewable tablet Chew 1 tablet (81 mg total) by mouth daily. Start taking on: August 12, 2019   B-12 PO Take 1 tablet by mouth daily.   clopidogrel 75 MG tablet Commonly known as: PLAVIX Take 1 tablet (75 mg total) by mouth daily with breakfast. What changed: when to take this   Co Q 10 100 MG Caps Take 100 mg by mouth daily.   hydroxypropyl methylcellulose / hypromellose 2.5 % ophthalmic solution Commonly known as: ISOPTO TEARS / GONIOVISC Place 1 drop into both eyes 4 (four) times daily as needed for dry eyes.   metoprolol tartrate 25 MG tablet Commonly known as:  LOPRESSOR Take 0.5 tablets (12.5 mg total) by mouth 2 (two) times daily.   nitroGLYCERIN 0.4 MG SL tablet Commonly known as: NITROSTAT Place 0.4 mg under the tongue every 5 (five) minutes as needed for chest pain.   rosuvastatin 20 MG tablet Commonly known as:  CRESTOR Take 20 mg by mouth 3 (three) times a week.   VITAMIN C PO Take 1 tablet by mouth every morning.   VITAMIN D PO Take 500 mcg by mouth daily.        Outstanding Labs/Studies   None   Duration of Discharge Encounter   Greater than 30 minutes including physician time.  Signed, Georgie Chard, NP 08/11/2019, 12:35 PM  Attending Note:   The patient was seen and examined.  Agree with assessment and plan as noted above.  Changes made to the above note as needed.  Patient seen and independently examined with  Ledon Snare, NP .   We discussed all aspects of the encounter. I agree with the assessment and plan as stated above.  1.   CAD.   She is s/p atherectomy of her LAD .   Has collateral filling of her RCA .   There are tenative plans for PCI of her occluded RCA   She is doing well .  Groin is stable Home today  Back to work on May 10 , To see Dr. Anne Snyder on may 11.  Already on ASA and plavix    I have spent a total of 40 minutes with patient reviewing hospital  notes , telemetry, EKGs, labs and examining patient as well as establishing an assessment and plan that was discussed with the patient. > 50% of time was spent in direct patient care.   Kristeen Miss, MD  08/13/2019 3:37 PM    [facility] [address] [place],  [address] [place], [place]  [address] Phone: [phone]; Fax: [phone]

## 2019-08-13 ENCOUNTER — Telehealth (HOSPITAL_COMMUNITY): Payer: Self-pay

## 2019-08-13 NOTE — Telephone Encounter (Signed)
Pt insurance is active and benefits verified through [facility] Medicare Co-pay 0, DED 0/0 met, out of pocket $10,000/$45.66 met, co-insurance 5%. no pre-authorization required. Passport, 08/13/2019'@8'$ :13am, REF# [id number]  Will contact patient to see if she is interested in the Cardiac Rehab Program. If interested, patient will need to complete follow up appt. Once completed, patient will be contacted for scheduling upon review by the RN Navigator.

## 2019-08-13 NOTE — Telephone Encounter (Signed)
Pt stated that she doesn't want to do the cardiac rehab program.  Closed referral.

## 2019-08-24 ENCOUNTER — Ambulatory Visit: Payer: Medicare Other | Admitting: Cardiology

## 2019-08-24 ENCOUNTER — Encounter: Payer: Self-pay | Admitting: *Deleted

## 2019-08-24 ENCOUNTER — Other Ambulatory Visit: Payer: Self-pay

## 2019-08-24 ENCOUNTER — Encounter: Payer: Self-pay | Admitting: Cardiology

## 2019-08-24 VITALS — BP 110/60 | HR 95 | Ht 65.0 in | Wt 130.0 lb

## 2019-08-24 DIAGNOSIS — Z01812 Encounter for preprocedural laboratory examination: Secondary | ICD-10-CM | POA: Diagnosis not present

## 2019-08-24 DIAGNOSIS — I2510 Atherosclerotic heart disease of native coronary artery without angina pectoris: Secondary | ICD-10-CM | POA: Diagnosis not present

## 2019-08-24 MED ORDER — FUROSEMIDE 20 MG PO TABS
20.0000 mg | ORAL_TABLET | Freq: Every day | ORAL | 3 refills | Status: DC
Start: 2019-08-24 — End: 2020-04-11

## 2019-08-24 NOTE — Progress Notes (Signed)
Cardiology Office Note:    Date:  08/25/2019   ID:  Kerri Snyder, DOB [birth year], MRN [id number]  PCP:  Carol Ada, MD  Cardiologist:  Candee Furbish, MD  Electrophysiologist:  None   Referring MD: Carol Ada, MD     History of Present Illness:    Kerri Snyder is a 79 y.o. female here for the follow-up of coronary artery disease with recent LAD intervention with left to right collaterals to RCA.  She has a residual 99% RCA lesion which will subsequently be intervened upon in the next few weeks.   She does feel better with less angina but is still not at baseline. Had episode of dyspnea while at beach post cath. Urgent care. CXR with mild pleural effusions. Lasix given and helped.    From prior note: She has been experiencing discomfort with heavy exertion with jaw/throat and arm pain.Mild SOB. When going up hill from bus stop.  No CP. Still works for for [employer] severe.  Was given nitroglycerin just in case.  This has been ongoing.  EKG performed showed inferolateral T wave changes that appear ischemic.  Her prior EKG in 2015 did not show these changes.  Mom-pacer, dad-heart problems, brother-had MI all had heart issues.   She has never smoked.  No diabetes.  Does have a history of hyperlipidemia hypertension.  Has been on Plavix secondary to prior PFO noted on echocardiogram in 2015.  Bilateral carotid endarterectomies done 2015.  She has had trouble with some of the statins in the past such as Crestor causing leg pain and stomach upset and Livalo causing severe leg cramps.  She has had a stroke-taking Plavix.  Stroke was in 2015 with acute left basal ganglia infarct.  Past Medical History:  Diagnosis Date  . Albinism ([place])   . Anxiety disorder, unspecified   . Blind    legally blind  . Carotid artery occlusion   . Cerebrovascular disease, unspecified   . DDD (degenerative disc disease), lumbar   . Diverticulitis large intestine w/o perforation or abscess w/o  bleeding   . Exercise-induced angina (HCC)   . Family history of anesthesia complication    cousin with nausea and vomiting post anesthesia  . First degree hemorrhoids   . Hereditary and idiopathic neuropathy, unspecified   . Hyperlipidemia   . Hypertension   . Osteopenia   . PFO (patent foramen ovale)   . Sciatica   . Stable angina (HCC)   . Stroke [facility]) 3/15    Past Surgical History:  Procedure Laterality Date  . CAROTID ENDARTERECTOMY Left June 25, 2013   cea  . CAROTID ENDARTERECTOMY Right August 06, 2013   cea  . CESAREAN SECTION    . CORONARY STENT INTERVENTION N/A 08/10/2019   Procedure: CORONARY STENT INTERVENTION;  Surgeon: Jettie Booze, MD;  Location: [place] [facility];  Service: Cardiovascular;  Laterality: N/A;  . Ear drum replacement 2000 Right 2000  . ENDARTERECTOMY Left 06/25/2013   Procedure: ENDARTERECTOMY CAROTID;  Surgeon: Rosetta Posner, MD;  Location: [place];  Service: Vascular;  Laterality: Left;  . ENDARTERECTOMY Right 08/06/2013   Procedure:  Carotid Endarterectomy with hemashield patch angioplasty;  Surgeon: Rosetta Posner, MD;  Location: [place];  Service: Vascular;  Laterality: Right;  . INTRAVASCULAR ULTRASOUND/IVUS N/A 08/10/2019   Procedure: Intravascular Ultrasound/IVUS;  Surgeon: Jettie Booze, MD;  Location: [place] [facility];  Service: Cardiovascular;  Laterality: N/A;  . LEFT HEART CATH AND CORONARY ANGIOGRAPHY  N/A 08/10/2019   Procedure: LEFT HEART CATH AND CORONARY ANGIOGRAPHY;  Surgeon: Corky Crafts, MD;  [facility] IN[facility]IVE [facility];  Service: Cardiovascular;  Laterality: N/A;  . TEE WITHOUT CARDIOVERSION N/A 06/17/2013   Procedure: TRANSESOPHAGEAL ECHOCARDIOGRAM (TEE);  Surgeon: Vesta Mixer, MD;  Location: [facility] ENDOSCOPY;  Service: Cardiovascular;  Laterality: N/A;  . TONSILLECTOMY    . TUBAL LIGATION      Current Medications: Current Meds  Medication Sig  . ALPRAZolam (XANAX) 0.25 MG tablet Take 0.25 mg by mouth daily as  needed for anxiety.   . Ascorbic Acid (VITAMIN C PO) Take 1 tablet by mouth every morning.  Marland Kitchen aspirin 81 MG chewable tablet Chew 1 tablet (81 mg total) by mouth daily.  . Cholecalciferol (VITAMIN D PO) Take 500 mcg by mouth daily.   . clopidogrel (PLAVIX) 75 MG tablet Take 1 tablet (75 mg total) by mouth daily with breakfast.  . Coenzyme Q10 (CO Q 10) 100 MG CAPS Take 100 mg by mouth daily.   . Cyanocobalamin (B-12 PO) Take 1 tablet by mouth daily.   . hydroxypropyl methylcellulose (ISOPTO TEARS) 2.5 % ophthalmic solution Place 1 drop into both eyes 4 (four) times daily as needed for dry eyes.  . metoprolol tartrate (LOPRESSOR) 25 MG tablet Take 0.5 tablets (12.5 mg total) by mouth 2 (two) times daily.  . nitroGLYCERIN (NITROSTAT) 0.4 MG SL tablet Place 0.4 mg under the tongue every 5 (five) minutes as needed for chest pain.   . rosuvastatin (CRESTOR) 20 MG tablet Take 20 mg by mouth 3 (three) times a week.  . [DISCONTINUED] furosemide (LASIX) 40 MG tablet Take 40 mg by mouth daily.     Allergies:   Patient has no known allergies.   Social History   Socioeconomic History  . Marital status: Married    Spouse name: Not on file  . Number of children: Not on file  . Years of education: Not on file  . Highest education level: Not on file  Occupational History  . Not on file  Tobacco Use  . Smoking status: Never Smoker  . Smokeless tobacco: Never Used  Substance and Sexual Activity  . Alcohol use: No    Alcohol/week: 0.0 standard drinks  . Drug use: No  . Sexual activity: Never  Other Topics Concern  . Not on file  Social History Narrative  . Not on file   Social Determinants of Health   Financial Resource Strain:   . Difficulty of Paying Living Expenses:   Food Insecurity:   . Worried About Programme researcher, broadcasting/film/video in the Last Year:   . Barista in the Last Year:   Transportation Needs:   . Freight forwarder (Medical):   Marland Kitchen Lack of Transportation (Non-Medical):    Physical Activity:   . Days of Exercise per Week:   . Minutes of Exercise per Session:   Stress:   . Feeling of Stress :   Social Connections:   . Frequency of Communication with Friends and Family:   . Frequency of Social Gatherings with Friends and Family:   . Attends Religious Services:   . Active Member of Clubs or Organizations:   . Attends Banker Meetings:   Marland Kitchen Marital Status:      Family History: The patient's family history includes Alcohol abuse in her father; Alzheimer's disease in her mother; Diabetes in her father; Heart disease in her father.  ROS:   Please see the  history of present illness.    Overall no fevers chills nausea vomiting syncope bleeding all other systems reviewed and are negative.  EKGs/Labs/Other Studies Reviewed:    The following studies were reviewed today: Prior echo office note from Dr. Katrinka Blazing, lab work, vascular study reviewed-patent endarterectomy  EKG:  EKG is  ordered today.  The ekg ordered today demonstrates sinus rhythm T wave inversion inferolaterally      Recent Labs: 08/11/2019: BUN 10; Creatinine, Ser 0.67; Hemoglobin 12.6; Platelets 205; Potassium 3.5; Sodium 139  Recent Lipid Panel    Component Value Date/Time   CHOL 176 06/15/2013 0250   TRIG 64 06/15/2013 0250   HDL 59 06/15/2013 0250   CHOLHDL 3.0 06/15/2013 0250   VLDL 13 06/15/2013 0250   LDLCALC 104 (H) 06/15/2013 0250    Physical Exam:    VS:  BP 110/60   Pulse 95   Ht 5\' 5"  (1.651 m)   Wt 130 lb (59 kg)   SpO2 98%   BMI 21.63 kg/m     Wt Readings from Last 3 Encounters:  08/24/19 130 lb (59 kg)  08/11/19 132 lb 8 oz (60.1 kg)  08/06/19 129 lb 12.8 oz (58.9 kg)     GEN:  Well nourished, well developed in no acute distress HEENT: Legally blind NECK: No JVD; No carotid bruits LYMPHATICS: No lymphadenopathy CARDIAC: RRR, no murmurs, rubs, gallops bounding radial pulse RESPIRATORY:  Clear to auscultation without rales, wheezing or rhonchi   ABDOMEN: Soft, non-tender, non-distended MUSCULOSKELETAL:  No edema; No deformity  SKIN: Warm and dry NEUROLOGIC:  Alert and oriented x 3 PSYCHIATRIC:  Normal affect   ASSESSMENT:    1. Pre-procedure lab exam   2. Coronary artery disease involving native coronary artery of native heart without angina pectoris    PLAN:    In order of problems listed above:  CAD with Exertional angina with abnormal EKG concerning for ischemia -   Mid RCA lesion is 99% stenosed. There were left to right collaterals feeding the distal RCA.  Mid LAD lesion is 90% stenosed.  After orbital atherectomy, a drug-eluting stent was successfully placed using a SYNERGY XD 2.50X28, postdialted to 3.0 mm.  Post intervention, there is a 0% residual stenosis.  There is mild left ventricular systolic dysfunction.  The left ventricular ejection fraction is 45-50% by visual estimate.  LV end diastolic pressure is normal.  There is no aortic valve stenosis.   Continue aggressive secondary prevention including aspirin and Plavix.  We will plan to bring her back for atherectomy of the RCA at a later time.  Would likely wait a couple of weeks.  We will watch her overnight since we had to use right groin for access site. Dr. 08/08/19   -She works for industry for the the [employer].  She is very eager to get back to work as soon as possible.  She can perform jobs that are very low in activity level. -We are scheduling heart cath for the week of the 25th.  Risk and benefits have been explained including stroke heart attack death renal impairment bleeding.  She did experience some transient hypotension during her last LAD procedure.  She is willing to proceed.  Pleural effusion bilaterally -While at the beach following her intervention/catheterization, she woke up feeling short of breath went to the urgent care there and an x-ray was performed which I was able to personally review that demonstrated bilateral pleural  effusions, mild.  She was given Lasix 40 mg  to take.  She is now feeling back to baseline.  I think it may make sense for her to take a low-dose of Lasix, 20 mg a day.  Her prior HCTZ 25 mg was stopped during the hospitalization because of relatively low blood pressures.  Her left ventricular end-diastolic pressure was normal during catheterization.   Mixed hyperlipidemia -She is able to tolerate the Crestor 20 mg 3 times a week.  She does have some leg pain associated with this that is mild.  She is continuing to push through.  She states.  Prior stroke 2015 -Status post bilateral carotid endarterectomy -On Plavix -Small PFO on TEE.  Medically managed.  Legally blind -She was here by herself in the office setting she is able to get around with assistance.  She can still see shapes etc.  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  Medication Adjustments/Labs and Tests Ordered: Current medicines are reviewed at length with the patient today.  Concerns regarding medicines are outlined above.  Orders Placed This Encounter  Procedures  . Basic metabolic panel  . CBC   Meds ordered this encounter  Medications  . furosemide (LASIX) 20 MG tablet    Sig: Take 1 tablet (20 mg total) by mouth daily.    Dispense:  90 tablet    Refill:  3    Please d/c any other rx for furosemide    Patient Instructions  Medication Instructions:  Please decrease your Furosemide to 20 mg every morning. Continue all other medications as listed.  *If you need a refill on your cardiac medications before your next appointment, please call your pharmacy*  Lab Work: Please return for blood work (CBC, BMP)  If you have labs (blood work) drawn today and your tests are completely normal, you will receive your results only by: Marland Kitchen MyChart Message (if you have MyChart) OR . A paper copy in the mail If you have any lab test that is abnormal or we  need to change your treatment, we will call you to review the results.  Testing/Procedures: Your physician has requested that you have a cardiac catheterization. Cardiac catheterization is used to diagnose and/or treat various heart conditions. Doctors may recommend this procedure for a number of different reasons. The most common reason is to evaluate chest pain. Chest pain can be a symptom of coronary artery disease (CAD), and cardiac catheterization can show whether plaque is narrowing or blocking your heart's arteries. This procedure is also used to evaluate the valves, as well as measure the blood flow and oxygen levels in different parts of your heart. For further information please visit [email or web address] Please follow instruction sheet, as given.  Follow-Up: At [facility], you and your health needs are our priority.  As part of our continuing mission to provide you with exceptional heart care, we have created designated Provider Care Teams.  These Care Teams include your primary Cardiologist (physician) and Advanced Practice Providers (APPs -  Physician Assistants and Nurse Practitioners) who all work together to provide you with the care you need, when you need it.  We recommend signing up for the patient portal called "MyChart".  Sign up information is provided on this After Visit Summary.  MyChart is used to connect with patients for Virtual Visits (Telemedicine).  Patients are able to view lab/test results, encounter notes, upcoming appointments, etc.  Non-urgent messages can be sent to your provider as well.  To learn more about what you can do with MyChart, go to [email or web address].    Your next appointment:   1 month(s)  The format for your next appointment:   In Person  Provider:   Donato SchultzMark , MD   [facility] you [facility]!!        Signed, Donato SchultzMark , MD  08/25/2019 6:51 AM    [facility]

## 2019-08-24 NOTE — H&P (View-Only) (Signed)
Cardiology Office Note:    Date:  08/25/2019   ID:  Kerri Snyder, DOB 06/16/1940, MRN [id number]  PCP:  Smith, Candace, MD  Cardiologist:  Daymien Goth, MD  Electrophysiologist:  None   Referring MD: Smith, Candace, MD     History of Present Illness:    Kerri Snyder is a 78 y.o. female here for the follow-up of coronary artery disease with recent LAD intervention with left to right collaterals to RCA.  She has a residual 99% RCA lesion which will subsequently be intervened upon in the next few weeks.   She does feel better with less angina but is still not at baseline. Had episode of dyspnea while at beach post cath. Urgent care. CXR with mild pleural effusions. Lasix given and helped.    From prior note: She has been experiencing discomfort with heavy exertion with jaw/throat and arm pain.Mild SOB. When going up hill from bus stop.  No CP. Still works for for [employer] severe.  Was given nitroglycerin just in case.  This has been ongoing.  EKG performed showed inferolateral T wave changes that appear ischemic.  Her prior EKG in 2015 did not show these changes.  Mom-pacer, dad-heart problems, brother-had MI all had heart issues.   She has never smoked.  No diabetes.  Does have a history of hyperlipidemia hypertension.  Has been on Plavix secondary to prior PFO noted on echocardiogram in 2015.  Bilateral carotid endarterectomies done 2015.  She has had trouble with some of the statins in the past such as Crestor causing leg pain and stomach upset and Livalo causing severe leg cramps.  She has had a stroke-taking Plavix.  Stroke was in 2015 with acute left basal ganglia infarct.  Past Medical History:  Diagnosis Date  . Albinism (HCC)   . Anxiety disorder, unspecified   . Blind    legally blind  . Carotid artery occlusion   . Cerebrovascular disease, unspecified   . DDD (degenerative disc disease), lumbar   . Diverticulitis large intestine w/o perforation or abscess w/o  bleeding   . Exercise-induced angina (HCC)   . Family history of anesthesia complication    cousin with nausea and vomiting post anesthesia  . First degree hemorrhoids   . Hereditary and idiopathic neuropathy, unspecified   . Hyperlipidemia   . Hypertension   . Osteopenia   . PFO (patent foramen ovale)   . Sciatica   . Stable angina (HCC)   . Stroke (HCC) 3/15    Past Surgical History:  Procedure Laterality Date  . CAROTID ENDARTERECTOMY Left June 25, 2013   cea  . CAROTID ENDARTERECTOMY Right August 06, 2013   cea  . CESAREAN SECTION    . CORONARY STENT INTERVENTION N/A 08/10/2019   Procedure: CORONARY STENT INTERVENTION;  Surgeon: Varanasi, Jayadeep S, MD;  Location: [facility];  Service: Cardiovascular;  Laterality: N/A;  . Ear drum replacement 2000 Right 2000  . ENDARTERECTOMY Left 06/25/2013   Procedure: ENDARTERECTOMY CAROTID;  Surgeon: Todd F Early, MD;  Location: [facility] OR;  Service: Vascular;  Laterality: Left;  . ENDARTERECTOMY Right 08/06/2013   Procedure:  Carotid Endarterectomy with hemashield patch angioplasty;  Surgeon: Todd F Early, MD;  Location: [facility] OR;  Service: Vascular;  Laterality: Right;  . INTRAVASCULAR ULTRASOUND/IVUS N/A 08/10/2019   Procedure: Intravascular Ultrasound/IVUS;  Surgeon: Varanasi, Jayadeep S, MD;  Location: [facility];  Service: Cardiovascular;  Laterality: N/A;  . LEFT HEART CATH AND CORONARY ANGIOGRAPHY   N/A 08/10/2019   Procedure: LEFT HEART CATH AND CORONARY ANGIOGRAPHY;  Surgeon: Varanasi, Jayadeep S, MD;  Location: [facility];  Service: Cardiovascular;  Laterality: N/A;  . TEE WITHOUT CARDIOVERSION N/A 06/17/2013   Procedure: TRANSESOPHAGEAL ECHOCARDIOGRAM (TEE);  Surgeon: Philip J Nahser, MD;  Location: [facility];  Service: Cardiovascular;  Laterality: N/A;  . TONSILLECTOMY    . TUBAL LIGATION      Current Medications: Current Meds  Medication Sig  . ALPRAZolam (XANAX) 0.25 MG tablet Take 0.25 mg by mouth daily as  needed for anxiety.   . Ascorbic Acid (VITAMIN C PO) Take 1 tablet by mouth every morning.  . aspirin 81 MG chewable tablet Chew 1 tablet (81 mg total) by mouth daily.  . Cholecalciferol (VITAMIN D PO) Take 500 mcg by mouth daily.   . clopidogrel (PLAVIX) 75 MG tablet Take 1 tablet (75 mg total) by mouth daily with breakfast.  . Coenzyme Q10 (CO Q 10) 100 MG CAPS Take 100 mg by mouth daily.   . Cyanocobalamin (B-12 PO) Take 1 tablet by mouth daily.   . hydroxypropyl methylcellulose (ISOPTO TEARS) 2.5 % ophthalmic solution Place 1 drop into both eyes 4 (four) times daily as needed for dry eyes.  . metoprolol tartrate (LOPRESSOR) 25 MG tablet Take 0.5 tablets (12.5 mg total) by mouth 2 (two) times daily.  . nitroGLYCERIN (NITROSTAT) 0.4 MG SL tablet Place 0.4 mg under the tongue every 5 (five) minutes as needed for chest pain.   . rosuvastatin (CRESTOR) 20 MG tablet Take 20 mg by mouth 3 (three) times a week.  . [DISCONTINUED] furosemide (LASIX) 40 MG tablet Take 40 mg by mouth daily.     Allergies:   Patient has no known allergies.   Social History   Socioeconomic History  . Marital status: Married    Spouse name: Not on file  . Number of children: Not on file  . Years of education: Not on file  . Highest education level: Not on file  Occupational History  . Not on file  Tobacco Use  . Smoking status: Never Smoker  . Smokeless tobacco: Never Used  Substance and Sexual Activity  . Alcohol use: No    Alcohol/week: 0.0 standard drinks  . Drug use: No  . Sexual activity: Never  Other Topics Concern  . Not on file  Social History Narrative  . Not on file   Social Determinants of Health   Financial Resource Strain:   . Difficulty of Paying Living Expenses:   Food Insecurity:   . Worried About Running Out of Food in the Last Year:   . Ran Out of Food in the Last Year:   Transportation Needs:   . Lack of Transportation (Medical):   . Lack of Transportation (Non-Medical):    Physical Activity:   . Days of Exercise per Week:   . Minutes of Exercise per Session:   Stress:   . Feeling of Stress :   Social Connections:   . Frequency of Communication with Friends and Family:   . Frequency of Social Gatherings with Friends and Family:   . Attends Religious Services:   . Active Member of Clubs or Organizations:   . Attends Club or Organization Meetings:   . Marital Status:      Family History: The patient's family history includes Alcohol abuse in her father; Alzheimer's disease in her mother; Diabetes in her father; Heart disease in her father.  ROS:   Please see the   history of present illness.    Overall no fevers chills nausea vomiting syncope bleeding all other systems reviewed and are negative.  EKGs/Labs/Other Studies Reviewed:    The following studies were reviewed today: Prior echo office note from Dr. Smith, lab work, vascular study reviewed-patent endarterectomy  EKG:  EKG is  ordered today.  The ekg ordered today demonstrates sinus rhythm T wave inversion inferolaterally      Recent Labs: 08/11/2019: BUN 10; Creatinine, Ser 0.67; Hemoglobin 12.6; Platelets 205; Potassium 3.5; Sodium 139  Recent Lipid Panel    Component Value Date/Time   CHOL 176 06/15/2013 0250   TRIG 64 06/15/2013 0250   HDL 59 06/15/2013 0250   CHOLHDL 3.0 06/15/2013 0250   VLDL 13 06/15/2013 0250   LDLCALC 104 (H) 06/15/2013 0250    Physical Exam:    VS:  BP 110/60   Pulse 95   Ht 5' 5" (1.651 m)   Wt 130 lb (59 kg)   SpO2 98%   BMI 21.63 kg/m     Wt Readings from Last 3 Encounters:  08/24/19 130 lb (59 kg)  08/11/19 132 lb 8 oz (60.1 kg)  08/06/19 129 lb 12.8 oz (58.9 kg)     GEN:  Well nourished, well developed in no acute distress HEENT: Legally blind NECK: No JVD; No carotid bruits LYMPHATICS: No lymphadenopathy CARDIAC: RRR, no murmurs, rubs, gallops bounding radial pulse RESPIRATORY:  Clear to auscultation without rales, wheezing or rhonchi   ABDOMEN: Soft, non-tender, non-distended MUSCULOSKELETAL:  No edema; No deformity  SKIN: Warm and dry NEUROLOGIC:  Alert and oriented x 3 PSYCHIATRIC:  Normal affect   ASSESSMENT:    1. Pre-procedure lab exam   2. Coronary artery disease involving native coronary artery of native heart without angina pectoris    PLAN:    In order of problems listed above:  CAD with Exertional angina with abnormal EKG concerning for ischemia -   Mid RCA lesion is 99% stenosed. There were left to right collaterals feeding the distal RCA.  Mid LAD lesion is 90% stenosed.  After orbital atherectomy, a drug-eluting stent was successfully placed using a SYNERGY XD [id number], postdialted to 3.0 mm.  Post intervention, there is a 0% residual stenosis.  There is mild left ventricular systolic dysfunction.  The left ventricular ejection fraction is 45-50% by visual estimate.  LV end diastolic pressure is normal.  There is no aortic valve stenosis.   Continue aggressive secondary prevention including aspirin and Plavix.  We will plan to bring her back for atherectomy of the RCA at a later time.  Would likely wait a couple of weeks.  We will watch her overnight since we had to use right groin for access site. Dr. Varanasi   -She works for [employer] for [employer].  She is very eager to get back to work as soon as possible.  She can perform jobs that are very low in activity level. -We are scheduling heart cath for the week of the 25th.  Risk and benefits have been explained including stroke heart attack death renal impairment bleeding.  She did experience some transient hypotension during her last LAD procedure.  She is willing to proceed.  Pleural effusion bilaterally -While at the beach following her intervention/catheterization, she woke up feeling short of breath went to the urgent care there and an x-ray was performed which I was able to personally review that demonstrated bilateral pleural  effusions, mild.  She was given Lasix 40 mg   to take.  She is now feeling back to baseline.  I think it may make sense for her to take a low-dose of Lasix, 20 mg a day.  Her prior HCTZ 25 mg was stopped during the hospitalization because of relatively low blood pressures.  Her left ventricular end-diastolic pressure was normal during catheterization.   Mixed hyperlipidemia -She is able to tolerate the Crestor 20 mg 3 times a week.  She does have some leg pain associated with this that is mild.  She is continuing to push through.  She states.  Prior stroke 2015 -Status post bilateral carotid endarterectomy -On Plavix -Small PFO on TEE.  Medically managed.  Legally blind -She was here by herself in the office setting she is able to get around with assistance.  She can still see shapes etc.  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  Medication Adjustments/Labs and Tests Ordered: Current medicines are reviewed at length with the patient today.  Concerns regarding medicines are outlined above.  Orders Placed This Encounter  Procedures  . Basic metabolic panel  . CBC   Meds ordered this encounter  Medications  . furosemide (LASIX) 20 MG tablet    Sig: Take 1 tablet (20 mg total) by mouth daily.    Dispense:  90 tablet    Refill:  3    Please d/c any other rx for furosemide    Patient Instructions  Medication Instructions:  Please decrease your Furosemide to 20 mg every morning. Continue all other medications as listed.  *If you need a refill on your cardiac medications before your next appointment, please call your pharmacy*  Lab Work: Please return for blood work (CBC, BMP)  If you have labs (blood work) drawn today and your tests are completely normal, you will receive your results only by: . MyChart Message (if you have MyChart) OR . A paper copy in the mail If you have any lab test that is abnormal or we  need to change your treatment, we will call you to review the results.  Testing/Procedures: Your physician has requested that you have a cardiac catheterization. Cardiac catheterization is used to diagnose and/or treat various heart conditions. Doctors may recommend this procedure for a number of different reasons. The most common reason is to evaluate chest pain. Chest pain can be a symptom of coronary artery disease (CAD), and cardiac catheterization can show whether plaque is narrowing or blocking your heart's arteries. This procedure is also used to evaluate the valves, as well as measure the blood flow and oxygen levels in different parts of your heart. For further information please visit www.cardiosmart.org. Please follow instruction sheet, as given.  Follow-Up: At [facility], you and your health needs are our priority.  As part of our continuing mission to provide you with exceptional heart care, we have created designated Provider Care Teams.  These Care Teams include your primary Cardiologist (physician) and Advanced Practice Providers (APPs -  Physician Assistants and Nurse Practitioners) who all work together to provide you with the care you need, when you need it.  We recommend signing up for the patient portal called "MyChart".  Sign up information is provided on this After Visit Summary.  MyChart is used to connect with patients for Virtual Visits (Telemedicine).  Patients are able to view lab/test results, encounter notes, upcoming appointments, etc.  Non-urgent messages can be sent to your provider as well.     To learn more about what you can do with MyChart, go to [email or web address].    Your next appointment:   1 month(s)  The format for your next appointment:   In Person  Provider:   Amenah Tucci, MD   Thank you for choosing [facility]!!        Signed, Vanassa Penniman, MD  08/25/2019 6:51 AM    [facility] 

## 2019-08-24 NOTE — Patient Instructions (Signed)
Medication Instructions:  Please decrease your Furosemide to 20 mg every morning. Continue all other medications as listed.  *If you need a refill on your cardiac medications before your next appointment, please call your pharmacy*  Lab Work: Please return for blood work (CBC, BMP)  If you have labs (blood work) drawn today and your tests are completely normal, you will receive your results only by: Marland Kitchen MyChart Message (if you have MyChart) OR . A paper copy in the mail If you have any lab test that is abnormal or we need to change your treatment, we will call you to review the results.  Testing/Procedures: Your physician has requested that you have a cardiac catheterization. Cardiac catheterization is used to diagnose and/or treat various heart conditions. Doctors may recommend this procedure for a number of different reasons. The most common reason is to evaluate chest pain. Chest pain can be a symptom of coronary artery disease (CAD), and cardiac catheterization can show whether plaque is narrowing or blocking your heart's arteries. This procedure is also used to evaluate the valves, as well as measure the blood flow and oxygen levels in different parts of your heart. For further information please visit [email or web address] Please follow instruction sheet, as given.  Follow-Up: At [facility], you and your health needs are our priority.  As part of our continuing mission to provide you with exceptional heart care, we have created designated Provider Care Teams.  These Care Teams include your primary Cardiologist (physician) and Advanced Practice Providers (APPs -  Physician Assistants and Nurse Practitioners) who all work together to provide you with the care you need, when you need it.  We recommend signing up for the patient portal called "MyChart".  Sign up information is provided on this After Visit Summary.  MyChart is used to connect with patients for Virtual Visits (Telemedicine).   Patients are able to view lab/test results, encounter notes, upcoming appointments, etc.  Non-urgent messages can be sent to your provider as well.   To learn more about what you can do with MyChart, go to [email or web address].    Your next appointment:   1 month(s)  The format for your next appointment:   In Person  Provider:   Donato Schultz, MD   Thank you for choosing [facility]!!

## 2019-08-27 ENCOUNTER — Telehealth: Payer: Self-pay | Admitting: Cardiology

## 2019-08-27 NOTE — Telephone Encounter (Signed)
Pt called and wanted to go back on her old BP medication. She had some fluid buildup with the new medication but did not have this issue when she was on the previous medication. She does not have the names of the medications with her right now because she is at work.  Please call her son Kerri Snyder to let the son know what the office wants to do. She is not able to take calls while she is at work

## 2019-08-27 NOTE — Telephone Encounter (Addendum)
Pt called back and she sounded anxious on the phone. She says that yesterday and today she woke up feeling"congestion" in her chest.. she could hear the fluid but as she got up out of bed she says it improves. She is very worried about the fluid coming back as it did when she was at the beach. She is asking to increase her fluid pills or any other options so she does not end up back in the hospital. Her BP has been good 120/65. She denies cough, chest pain, dizziness.   Will forward to Dr. Anne Fu for review. I asked the pt to relax and continue to monitor.

## 2019-08-27 NOTE — Telephone Encounter (Signed)
Kerri Snyder states she is returning [name]'s call. Please advise.

## 2019-08-27 NOTE — Telephone Encounter (Signed)
Corayma Cashatt called asking to have the pt meds changed "back to the way they were" prior to her going on her trip.   He says she was c/o feeling fluid back in her lungs yesterday. But, he did not have any further information and said she was at work.   I advised him that I need to speak with her about her symptoms and what problems she is having for more information for Dr. Anne Fu.   He says he will call her and have her call us right back but she will only have a few minutes to speak with me.

## 2019-08-30 NOTE — Telephone Encounter (Signed)
Follow up   Patient states that she is returning call from [place]. Please call.

## 2019-08-30 NOTE — Telephone Encounter (Signed)
Pt called back and reports that over the weekend she went back on her HCTZ (unsure of dose) in the evening as she had done in the ast for several years. She stopped her furosemide.   She says this is why she felt better over the weekend. She says she has checked her BP and it has been "fine".   I asked her to keep doing what she is doing and to not make any other changes. I will forward to Dr. Anne Fu for his review.   Her follow up with him is 09/20/19. Pre procedure labs 09/07/19. Stent planned for 09/09/19.

## 2019-08-30 NOTE — Telephone Encounter (Signed)
Called to check on the pt and spoke with her Son on [place], Thayer Ohm.Marland Kitchen and he reports that he did not see her this morning she is already at work but he reports that she felt well over the weekend. He says he will call her at work and see how she is today and call me back.   I advised him that it is difficult for the MD to make med changes when the pt says she is feels well after taking her moring meds. That means the meds are doing their job. That she should keep her follow up with Dr. Anne Fu and let us know if anything changes or  worsens. She will also be sure that she is seeing her PCP Dr. Katrinka Blazing his week or next week for follow up.   Will wait for Dr. Anne Fu review.

## 2019-09-02 ENCOUNTER — Telehealth: Payer: Self-pay | Admitting: Cardiology

## 2019-09-02 NOTE — Telephone Encounter (Signed)
Left the pt a message to call the office back and request to speak with any triage nurse, for further assistance.  

## 2019-09-02 NOTE — Telephone Encounter (Signed)
Thank you.  Please update her medication list.  Appreciated. Donato Schultz, MD

## 2019-09-02 NOTE — Telephone Encounter (Signed)
Patient would like a lipid panel done when she comes in Tuesday for lab work and to have the results sent to Dr. Merri Brunette her PCP. She would like a call back to let her know if this is okay.

## 2019-09-03 ENCOUNTER — Other Ambulatory Visit: Payer: Medicare Other

## 2019-09-07 ENCOUNTER — Other Ambulatory Visit (HOSPITAL_COMMUNITY)
Admission: RE | Admit: 2019-09-07 | Discharge: 2019-09-07 | Disposition: A | Discharge status: Home / Self Care | Payer: Medicare Other | Source: Ambulatory Visit | Attending: Interventional Cardiology | Admitting: Interventional Cardiology

## 2019-09-07 ENCOUNTER — Other Ambulatory Visit: Payer: Medicare Other | Admitting: *Deleted

## 2019-09-07 ENCOUNTER — Other Ambulatory Visit: Payer: Self-pay

## 2019-09-07 DIAGNOSIS — Z20822 Contact with and (suspected) exposure to covid-19: Secondary | ICD-10-CM | POA: Diagnosis not present

## 2019-09-07 DIAGNOSIS — Z01812 Encounter for preprocedural laboratory examination: Secondary | ICD-10-CM | POA: Insufficient documentation

## 2019-09-07 DIAGNOSIS — I2510 Atherosclerotic heart disease of native coronary artery without angina pectoris: Secondary | ICD-10-CM

## 2019-09-07 LAB — SARS CORONAVIRUS 2 (TAT 6-24 HRS): SARS Coronavirus 2: NEGATIVE

## 2019-09-08 ENCOUNTER — Telehealth: Payer: Self-pay | Admitting: *Deleted

## 2019-09-08 LAB — BASIC METABOLIC PANEL
BUN/Creatinine Ratio: 27 (ref 12–28)
BUN: 24 mg/dL (ref 8–27)
CO2: 25 mmol/L (ref 20–29)
Calcium: 10 mg/dL (ref 8.7–10.3)
Chloride: 93 mmol/L — ABNORMAL LOW (ref 96–106)
Creatinine, Ser: 0.88 mg/dL (ref 0.57–1.00)
GFR calc Af Amer: 72 mL/min/{1.73_m2} (ref >59)
GFR calc non Af Amer: 62 mL/min/{1.73_m2} (ref >59)
Glucose: 87 mg/dL (ref 65–99)
Potassium: 3.8 mmol/L (ref 3.5–5.2)
Sodium: 149 mmol/L — ABNORMAL HIGH (ref 134–144)

## 2019-09-08 LAB — CBC
Hematocrit: 38.6 % (ref 34.0–46.6)
Hemoglobin: 12.7 g/dL (ref 11.1–15.9)
MCH: 29.3 pg (ref 26.6–33.0)
MCHC: 32.9 g/dL (ref 31.5–35.7)
MCV: 89 fL (ref 79–97)
Platelets: 219 10*3/uL (ref 150–450)
RBC: 4.34 x10E6/uL (ref 3.77–5.28)
RDW: 13.1 % (ref 11.7–15.4)
WBC: 6.7 10*3/uL (ref 3.4–10.8)

## 2019-09-08 NOTE — Telephone Encounter (Signed)
Follow up ° ° °Patient is returning your call. Please call. ° ° ° °

## 2019-09-08 NOTE — Telephone Encounter (Signed)
Voicemail message, no answer. 

## 2019-09-08 NOTE — Telephone Encounter (Addendum)
Pt contacted pre-coronary stent intervention scheduled at [facility] for: Thursday Sep 09, 2019 7:30 AM Verified arrival time and place: [facility] Main Entrance A [facility]) at: 5:30 AM   No solid food after midnight prior to cath, clear liquids until 5 AM day of procedure.  Hold: HCTZ-AM of procedure Lasix-AM of procedure - If taking  Except hold medications AM meds can be  taken pre-cath with sip of water including: ASA 81 mg Plavix 75 mg  Confirmed patient has responsible adult to drive home post procedure and observe 24 hours after arriving home:   You are allowed ONE visitor in the waiting room during your procedure. Both you and your visitor must wear masks.      COVID-19 Pre-Screening Questions:  . In the past 7 to 10 days have you had a cough,  shortness of breath, headache, congestion, fever (100 or greater) body aches, chills, sore throat, or sudden loss of taste or sense of smell? . Have you been around anyone with known Covid 19 in the past 7 to 10 days? . Have you been around anyone who is awaiting Covid 19 test results in the past 7 to 10 days? . Have you been around anyone who has mentioned symptoms of Covid 19 within the past 7 to 10 days?     LMTCB to review procedure instructions with patient.

## 2019-09-09 ENCOUNTER — Encounter (HOSPITAL_COMMUNITY): Payer: Self-pay | Admitting: Interventional Cardiology

## 2019-09-09 ENCOUNTER — Ambulatory Visit (HOSPITAL_COMMUNITY)
Admission: RE | Admit: 2019-09-09 | Discharge: 2019-09-10 | Disposition: A | Discharge status: Home / Self Care | Payer: Medicare Other | Attending: Interventional Cardiology | Admitting: Interventional Cardiology

## 2019-09-09 ENCOUNTER — Other Ambulatory Visit: Payer: Self-pay

## 2019-09-09 ENCOUNTER — Encounter (HOSPITAL_COMMUNITY)
Admission: RE | Disposition: A | Discharge status: Home / Self Care | Payer: Self-pay | Source: Home / Self Care | Attending: Interventional Cardiology

## 2019-09-09 DIAGNOSIS — Z8673 Personal history of transient ischemic attack (TIA), and cerebral infarction without residual deficits: Secondary | ICD-10-CM | POA: Insufficient documentation

## 2019-09-09 DIAGNOSIS — I6529 Occlusion and stenosis of unspecified carotid artery: Secondary | ICD-10-CM | POA: Insufficient documentation

## 2019-09-09 DIAGNOSIS — Z7902 Long term (current) use of antithrombotics/antiplatelets: Secondary | ICD-10-CM | POA: Diagnosis not present

## 2019-09-09 DIAGNOSIS — Z79899 Other long term (current) drug therapy: Secondary | ICD-10-CM | POA: Diagnosis not present

## 2019-09-09 DIAGNOSIS — E782 Mixed hyperlipidemia: Secondary | ICD-10-CM | POA: Diagnosis not present

## 2019-09-09 DIAGNOSIS — Q211 Atrial septal defect: Secondary | ICD-10-CM | POA: Diagnosis not present

## 2019-09-09 DIAGNOSIS — I10 Essential (primary) hypertension: Secondary | ICD-10-CM | POA: Insufficient documentation

## 2019-09-09 DIAGNOSIS — M8580 Other specified disorders of bone density and structure, unspecified site: Secondary | ICD-10-CM | POA: Diagnosis not present

## 2019-09-09 DIAGNOSIS — F419 Anxiety disorder, unspecified: Secondary | ICD-10-CM | POA: Insufficient documentation

## 2019-09-09 DIAGNOSIS — I25118 Atherosclerotic heart disease of native coronary artery with other forms of angina pectoris: Secondary | ICD-10-CM

## 2019-09-09 DIAGNOSIS — Z955 Presence of coronary angioplasty implant and graft: Secondary | ICD-10-CM | POA: Diagnosis not present

## 2019-09-09 DIAGNOSIS — Z7982 Long term (current) use of aspirin: Secondary | ICD-10-CM | POA: Diagnosis not present

## 2019-09-09 DIAGNOSIS — I2510 Atherosclerotic heart disease of native coronary artery without angina pectoris: Secondary | ICD-10-CM

## 2019-09-09 DIAGNOSIS — I2584 Coronary atherosclerosis due to calcified coronary lesion: Secondary | ICD-10-CM | POA: Diagnosis not present

## 2019-09-09 DIAGNOSIS — I25119 Atherosclerotic heart disease of native coronary artery with unspecified angina pectoris: Secondary | ICD-10-CM | POA: Diagnosis present

## 2019-09-09 DIAGNOSIS — Z8249 Family history of ischemic heart disease and other diseases of the circulatory system: Secondary | ICD-10-CM | POA: Insufficient documentation

## 2019-09-09 DIAGNOSIS — H548 Legal blindness, as defined in USA: Secondary | ICD-10-CM | POA: Diagnosis not present

## 2019-09-09 DIAGNOSIS — Z01812 Encounter for preprocedural laboratory examination: Secondary | ICD-10-CM

## 2019-09-09 HISTORY — DX: Atherosclerotic heart disease of native coronary artery without angina pectoris: I25.10

## 2019-09-09 HISTORY — PX: LEFT HEART CATH AND CORONARY ANGIOGRAPHY: CATH118249

## 2019-09-09 HISTORY — PX: CORONARY STENT INTERVENTION: CATH118234

## 2019-09-09 HISTORY — PX: INTRAVASCULAR ULTRASOUND/IVUS: CATH118244

## 2019-09-09 HISTORY — PX: CORONARY ATHERECTOMY: CATH118238

## 2019-09-09 LAB — POCT ACTIVATED CLOTTING TIME
Activated Clotting Time: 268 seconds
Activated Clotting Time: 279 seconds
Activated Clotting Time: 307 seconds
Activated Clotting Time: 323 seconds
Activated Clotting Time: 213 seconds
Activated Clotting Time: 180 seconds

## 2019-09-09 SURGERY — CORONARY STENT INTERVENTION
Anesthesia: LOCAL

## 2019-09-09 MED ORDER — SODIUM CHLORIDE 0.9% FLUSH: 3.0000 mL | INTRAVENOUS | Status: DC | PRN

## 2019-09-09 MED ORDER — MIDAZOLAM HCL 2 MG/2ML IJ SOLN
INTRAMUSCULAR | Status: DC | PRN
  Administered 2019-09-09 (×2): 1 mg via INTRAVENOUS

## 2019-09-09 MED ORDER — HEPARIN SODIUM (PORCINE) 1000 UNIT/ML IJ SOLN
INTRAMUSCULAR | Status: AC
  Filled 2019-09-09: qty 1

## 2019-09-09 MED ORDER — HEPARIN (PORCINE) IN NACL 1000-0.9 UT/500ML-% IV SOLN
INTRAVENOUS | Status: AC
  Filled 2019-09-09: qty 1000

## 2019-09-09 MED ORDER — SODIUM CHLORIDE 0.9% FLUSH
3.0000 mL | Freq: Two times a day (BID) | INTRAVENOUS | Status: DC
  Administered 2019-09-09 – 2019-09-10 (×2): 3 mL via INTRAVENOUS

## 2019-09-09 MED ORDER — ASPIRIN 81 MG PO CHEW
81.0000 mg | CHEWABLE_TABLET | Freq: Every day | ORAL | Status: DC
  Administered 2019-09-10: 81 mg via ORAL
  Filled 2019-09-09: qty 1

## 2019-09-09 MED ORDER — SODIUM CHLORIDE 0.9 % IV SOLN: 250.0000 mL | INTRAVENOUS | Status: DC | PRN

## 2019-09-09 MED ORDER — ACETAMINOPHEN 325 MG PO TABS: 650.0000 mg | ORAL_TABLET | ORAL | Status: DC | PRN

## 2019-09-09 MED ORDER — HEPARIN (PORCINE) IN NACL 1000-0.9 UT/500ML-% IV SOLN
INTRAVENOUS | Status: DC | PRN
  Administered 2019-09-09 (×3): 500 mL

## 2019-09-09 MED ORDER — NITROGLYCERIN 1 MG/10 ML FOR IR/CATH LAB
INTRA_ARTERIAL | Status: DC | PRN
  Administered 2019-09-09 (×3): 200 ug via INTRACORONARY

## 2019-09-09 MED ORDER — SODIUM CHLORIDE 0.9% FLUSH: 3.0000 mL | Freq: Two times a day (BID) | INTRAVENOUS | Status: DC

## 2019-09-09 MED ORDER — LIDOCAINE HCL (PF) 1 % IJ SOLN
INTRAMUSCULAR | Status: DC | PRN
  Administered 2019-09-09: 15 mL

## 2019-09-09 MED ORDER — HYPROMELLOSE (GONIOSCOPIC) 2.5 % OP SOLN
1.0000 [drp] | Freq: Four times a day (QID) | OPHTHALMIC | Status: DC | PRN
  Filled 2019-09-09: qty 15

## 2019-09-09 MED ORDER — ROSUVASTATIN CALCIUM 20 MG PO TABS: 20.0000 mg | ORAL_TABLET | ORAL | Status: DC

## 2019-09-09 MED ORDER — FENTANYL CITRATE (PF) 100 MCG/2ML IJ SOLN
INTRAMUSCULAR | Status: AC
  Filled 2019-09-09: qty 2

## 2019-09-09 MED ORDER — CLOPIDOGREL BISULFATE 75 MG PO TABS: 75.0000 mg | ORAL_TABLET | Freq: Every day | ORAL | Status: DC

## 2019-09-09 MED ORDER — ONDANSETRON HCL 4 MG/2ML IJ SOLN: 4.0000 mg | Freq: Four times a day (QID) | INTRAMUSCULAR | Status: DC | PRN

## 2019-09-09 MED ORDER — SODIUM CHLORIDE 0.9 % WEIGHT BASED INFUSION: 1.0000 mL/kg/h | INTRAVENOUS | Status: DC

## 2019-09-09 MED ORDER — SODIUM CHLORIDE 0.9 % IV SOLN: INTRAVENOUS | Status: AC

## 2019-09-09 MED ORDER — METOPROLOL TARTRATE 12.5 MG HALF TABLET
12.5000 mg | ORAL_TABLET | Freq: Two times a day (BID) | ORAL | Status: DC
  Administered 2019-09-09 – 2019-09-10 (×2): 12.5 mg via ORAL
  Filled 2019-09-09 (×2): qty 1

## 2019-09-09 MED ORDER — SODIUM CHLORIDE 0.9 % WEIGHT BASED INFUSION
3.0000 mL/kg/h | INTRAVENOUS | Status: DC
  Administered 2019-09-09: 3 mL/kg/h via INTRAVENOUS

## 2019-09-09 MED ORDER — HYDROCHLOROTHIAZIDE 25 MG PO TABS
25.0000 mg | ORAL_TABLET | Freq: Every day | ORAL | Status: DC
  Administered 2019-09-09: 25 mg via ORAL
  Filled 2019-09-09: qty 1

## 2019-09-09 MED ORDER — LABETALOL HCL 5 MG/ML IV SOLN: 10.0000 mg | INTRAVENOUS | Status: AC | PRN

## 2019-09-09 MED ORDER — HEPARIN SODIUM (PORCINE) 1000 UNIT/ML IJ SOLN
INTRAMUSCULAR | Status: DC | PRN
  Administered 2019-09-09 (×2): 3000 [IU] via INTRAVENOUS
  Administered 2019-09-09: 8000 [IU] via INTRAVENOUS

## 2019-09-09 MED ORDER — FENTANYL CITRATE (PF) 100 MCG/2ML IJ SOLN
INTRAMUSCULAR | Status: DC | PRN
  Administered 2019-09-09 (×2): 25 ug via INTRAVENOUS

## 2019-09-09 MED ORDER — IOHEXOL 350 MG/ML SOLN
INTRAVENOUS | Status: DC | PRN
  Administered 2019-09-09: 70 mL

## 2019-09-09 MED ORDER — CLOPIDOGREL BISULFATE 75 MG PO TABS
75.0000 mg | ORAL_TABLET | Freq: Every day | ORAL | Status: DC
  Administered 2019-09-10: 75 mg via ORAL
  Filled 2019-09-09: qty 1

## 2019-09-09 MED ORDER — NITROGLYCERIN 1 MG/10 ML FOR IR/CATH LAB
INTRA_ARTERIAL | Status: AC
  Filled 2019-09-09: qty 10

## 2019-09-09 MED ORDER — MIDAZOLAM HCL 2 MG/2ML IJ SOLN
INTRAMUSCULAR | Status: AC
  Filled 2019-09-09: qty 2

## 2019-09-09 MED ORDER — HYDRALAZINE HCL 20 MG/ML IJ SOLN: 10.0000 mg | INTRAMUSCULAR | Status: AC | PRN

## 2019-09-09 MED ORDER — ASPIRIN 81 MG PO CHEW: 81.0000 mg | CHEWABLE_TABLET | Freq: Every day | ORAL | Status: DC

## 2019-09-09 MED ORDER — LIDOCAINE HCL (PF) 1 % IJ SOLN
INTRAMUSCULAR | Status: AC
  Filled 2019-09-09: qty 30

## 2019-09-09 MED ORDER — ASPIRIN 81 MG PO CHEW: 81.0000 mg | CHEWABLE_TABLET | ORAL | Status: DC

## 2019-09-09 MED ORDER — NITROGLYCERIN 0.4 MG SL SUBL: 0.4000 mg | SUBLINGUAL_TABLET | SUBLINGUAL | Status: DC | PRN

## 2019-09-09 SURGICAL SUPPLY — 28 items
BALLN SAPPHIRE 2.75X20 (BALLOONS) ×2
BALLN SAPPHIRE ~~LOC~~ 3.75X15 (BALLOONS) ×2 IMPLANT
BALLOON SAPPHIRE 2.75X20 (BALLOONS) ×1 IMPLANT
CABLE ADAPT PACING TEMP 12FT (ADAPTER) ×2 IMPLANT
CATH INFINITI 5FR JL4 (CATHETERS) ×2 IMPLANT
CATH S G BIP PACING (CATHETERS) ×2 IMPLANT
CATH TELEPORT (CATHETERS) ×2 IMPLANT
CATH ULTRASOUND OPTICROSS 6FR (CATHETERS) ×2 IMPLANT
CATH VISTA GUIDE 6FR XBRCA (CATHETERS) ×2 IMPLANT
CROWN DIAMONDBACK CLASSIC 1.25 (BURR) ×2 IMPLANT
KIT ENCORE 26 ADVANTAGE (KITS) ×2 IMPLANT
KIT HEART LEFT (KITS) ×2 IMPLANT
KIT HEMO VALVE WATCHDOG (MISCELLANEOUS) ×2 IMPLANT
LUBRICANT VIPERSLIDE CORONARY (MISCELLANEOUS) ×2 IMPLANT
PACK CARDIAC CATHETERIZATION (CUSTOM PROCEDURE TRAY) ×2 IMPLANT
SHEATH PINNACLE 6F 10CM (SHEATH) ×4 IMPLANT
SHEATH PROBE COVER 6X72 (BAG) ×2 IMPLANT
SLED PULL BACK IVUS (MISCELLANEOUS) ×2 IMPLANT
SLEEVE REPOSITIONING LENGTH 30 (MISCELLANEOUS) ×2 IMPLANT
STENT SYNERGY XD 3.0X38 (Permanent Stent) ×1 IMPLANT
STENT SYNERGY XD 3.50X24 (Permanent Stent) ×1 IMPLANT
SYNERGY XD 3.0X38 (Permanent Stent) ×2 IMPLANT
SYNERGY XD 3.50X24 (Permanent Stent) ×2 IMPLANT
TRANSDUCER W/STOPCOCK (MISCELLANEOUS) ×2 IMPLANT
TUBING CIL FLEX 10 FLL-RA (TUBING) ×2 IMPLANT
WIRE ASAHI PROWATER 180CM (WIRE) ×2 IMPLANT
WIRE EMERALD 3MM-J .035X150CM (WIRE) ×2 IMPLANT
WIRE VIPERWIRE COR FLEX .012 (WIRE) ×2 IMPLANT

## 2019-09-09 NOTE — Interval H&P Note (Signed)
Cath Lab Visit (complete for each Cath Lab visit)  Clinical Evaluation Leading to the Procedure:   ACS: No.  Non-ACS:    Anginal Classification: CCS III  Anti-ischemic medical therapy: Minimal Therapy (1 class of medications)  Non-Invasive Test Results: No non-invasive testing performed  Prior CABG: No previous CABG   Patient improved after LAD stent a few weeks ago, but still with some DOE.    History and Physical Interval Note:  09/09/2019 7:29 AM  Kerri Snyder  has presented today for surgery, with the diagnosis of CAD.  The various methods of treatment have been discussed with the patient and family. After consideration of risks, benefits and other options for treatment, the patient has consented to  Procedure(s): CORONARY STENT INTERVENTION (N/A) CORONARY ATHERECTOMY (N/A) as a surgical intervention.  The patient's history has been reviewed, patient examined, no change in status, stable for surgery.  I have reviewed the patient's chart and labs.  Questions were answered to the patient's satisfaction.     Lance Muss

## 2019-09-09 NOTE — Plan of Care (Signed)
  Problem: Education: Goal: Knowledge of General Education information will improve Description Including pain rating scale, medication(s)/side effects and non-pharmacologic comfort measures Outcome: Progressing   

## 2019-09-09 NOTE — Progress Notes (Signed)
Site area: right groin  Site Prior to Removal:  Level 0  Pressure Applied For 60 MINUTES    Minutes Beginning at 1235  Manual:   Yes.    Patient Status During Pull:  Stable  Post Pull Groin Site:  Level 1  Post Pull Instructions Given:  Yes.    Post Pull Pulses Present:  Yes.    Dressing Applied:  Yes.    Comments:  Bed rest started at 1335 X 4 hr.

## 2019-09-10 DIAGNOSIS — I10 Essential (primary) hypertension: Secondary | ICD-10-CM | POA: Diagnosis not present

## 2019-09-10 DIAGNOSIS — I25118 Atherosclerotic heart disease of native coronary artery with other forms of angina pectoris: Secondary | ICD-10-CM | POA: Diagnosis not present

## 2019-09-10 DIAGNOSIS — I2510 Atherosclerotic heart disease of native coronary artery without angina pectoris: Secondary | ICD-10-CM

## 2019-09-10 DIAGNOSIS — Z955 Presence of coronary angioplasty implant and graft: Secondary | ICD-10-CM | POA: Diagnosis not present

## 2019-09-10 DIAGNOSIS — Z9861 Coronary angioplasty status: Secondary | ICD-10-CM | POA: Diagnosis not present

## 2019-09-10 DIAGNOSIS — I2584 Coronary atherosclerosis due to calcified coronary lesion: Secondary | ICD-10-CM | POA: Diagnosis not present

## 2019-09-10 LAB — CBC
HCT: 36.3 % (ref 36.0–46.0)
Hemoglobin: 11.5 g/dL — ABNORMAL LOW (ref 12.0–15.0)
MCH: 29.3 pg (ref 26.0–34.0)
MCHC: 31.7 g/dL (ref 30.0–36.0)
MCV: 92.6 fL (ref 80.0–100.0)
Platelets: 175 10*3/uL (ref 150–400)
RBC: 3.92 MIL/uL (ref 3.87–5.11)
RDW: 13.9 % (ref 11.5–15.5)
WBC: 7.3 10*3/uL (ref 4.0–10.5)
nRBC: 0 % (ref 0.0–0.2)

## 2019-09-10 LAB — LIPID PANEL
Cholesterol: 146 mg/dL (ref 0–200)
HDL: 48 mg/dL (ref >40)
LDL Cholesterol: 78 mg/dL (ref 0–99)
Total CHOL/HDL Ratio: 3 RATIO
Triglycerides: 100 mg/dL (ref <150)
VLDL: 20 mg/dL (ref 0–40)

## 2019-09-10 LAB — BASIC METABOLIC PANEL
Anion gap: 6 (ref 5–15)
BUN: 14 mg/dL (ref 8–23)
CO2: 26 mmol/L (ref 22–32)
Calcium: 8.9 mg/dL (ref 8.9–10.3)
Chloride: 108 mmol/L (ref 98–111)
Creatinine, Ser: 0.69 mg/dL (ref 0.44–1.00)
GFR calc Af Amer: >60 mL/min (ref >60)
GFR calc non Af Amer: >60 mL/min (ref >60)
Glucose, Bld: 110 mg/dL — ABNORMAL HIGH (ref 70–99)
Potassium: 3.2 mmol/L — ABNORMAL LOW (ref 3.5–5.1)
Sodium: 140 mmol/L (ref 135–145)

## 2019-09-10 MED ORDER — POTASSIUM CHLORIDE CRYS ER 20 MEQ PO TBCR
40.0000 meq | EXTENDED_RELEASE_TABLET | ORAL | Status: AC
  Administered 2019-09-10 (×2): 40 meq via ORAL
  Filled 2019-09-10 (×2): qty 2

## 2019-09-10 NOTE — Progress Notes (Signed)
CARDIAC REHAB PHASE I   PRE:  Rate/Rhythm: 90 SR  BP:  Supine: 95/77  Sitting:  Standing:    SaO2: 98%RA  MODE:  Ambulation: 470 ft   POST:  Rate/Rhythm: 116 ST   93 SR at rest  BP:  Supine:   Sitting: 121/63  Standing:    SaO2: 96-99%RA [id number] Pt walked 470 ft with hand held asst with steady gait and tolerated well. No CP. Pt in good spirits and hopes to go home. Brief ed done as pt just seen a few weeks ago. Pt takes her plavix every day, knows how to take NTG if needed, gave walking instructions for ex and no questions re diet. Had received diet information last admission. Referred to [place] CRP 2 but pt not interested. Pt still works and is very active.   Luetta Nutting, RN BSN  09/10/2019 8:48 AM

## 2019-09-10 NOTE — Discharge Summary (Signed)
Discharge Summary    Patient ID: Kerri Snyder,  MRN: [id number], DOB/AGE: December 23, 1940 79 y.o.  Admit date: 09/09/2019 Discharge date: 09/10/2019  Primary Care Provider: Merri Brunette Primary Cardiologist: Dr. Anne Fu  Discharge Diagnoses    Active Problems:   Coronary artery disease   HTN   HLD  Allergies No Known Allergies  Diagnostic Studies/Procedures    Cath: 09/09/19   Previously placed Mid LAD stent (unknown type) is widely patent.  Prox RCA lesion is 95% stenosed. Orbital atherectomy was performed.  A drug-eluting stent was successfully placed using a SYNERGY XD 3.50X24. Stent postdilated to 3.75 mm.  Post intervention, there is a 0% residual stenosis.  Mid RCA lesion is 99% stenosed. Orbital atherectomy was performed.  A drug-eluting stent was successfully placed using a SYNERGY XD 3.0X38. Proximal stent was postdilated to 3.75 mm.  Post intervention, there is a 0% residual stenosis.  The stents overlap and were optimized with IVUS.  LV end diastolic pressure is mildly elevated.  There is no aortic valve stenosis.  RPAV lesion is 75% stenosed. Lesion in small, distal vessel.   Watch overnight given that she is visually impaired.   Continue aggressive secondary prevention.   Consider clopidogrel monotherapy after 6 months given diffuse disease and long stented areas.   Diagnostic Dominance: Right   Intervention    _____________   History of Present Illness     Kerri Snyder is a 79 y.o. female who presented to the office for the follow-up of coronary artery disease with recent LAD intervention with left to right collaterals to RCA. She had a residual 99% RCA lesion with plans to intervene in a staged intervention.  She reported feeling better with less angina but was still not at baseline. [place] post [place]. Went to urgent care. CXR with mild pleural effusions. Lasix given and helped.   From prior  notes: She has been experiencing discomfort with heavy exertion with jaw/throat and arm pain.Mild SOB. When going up hill from bus stop.  No CP. Still works for for [employer] severe.  Was given nitroglycerin just in case.  This has been ongoing.  EKG performed showed inferolateral T wave changes that appear ischemic.  Her prior EKG in 2015 did not show these changes.  Mom-pacer, dad-heart problems, brother-had MI all had heart issues.   She has never smoked.  No diabetes.  Does have a history of hyperlipidemia hypertension.  Has been on Plavix secondary to prior PFO noted on echocardiogram in 2015.  Bilateral carotid endarterectomies done 2015.  She has had trouble with some of the statins in the past such as Crestor causing leg pain and stomach upset and Livalo causing severe leg cramps.  She has had a stroke-taking Plavix.  Stroke was in 2015 with acute left basal ganglia infarct.  She was scheduled for outpatient cath with Dr. Eldridge Dace.  Hospital Course     Consultants: none   Underwent cardiac catheterization noted above with successful PCI/DES to the mid RCA with orbital atherectomy.  Plan to continue on dual antiplatelet therapy with aspirin and Plavix for at least 6 months.  Consider continuing Plavix as monotherapy after 6 months given diffuse disease and long stented areas. Morning labs were stable with the exception of mild hypokalemia. This was supplemented prior to discharge. No complications noted post cath. Worked well with cardiac rehab without recurrent chest pain. She was continued on her home medications without significant changes.  _____________  Discharge Vitals Blood pressure (!) 94/51, pulse 82, temperature 97.9 F (36.6 C), temperature source Oral, resp. rate 18, height 5\' 5"  (1.651 m), weight 58.5 kg, SpO2 96 %.  Filed Weights   09/09/19 0554 09/10/19 0445  Weight: 58.5 kg 58.5 kg    Labs & Radiologic Studies    CBC Recent Labs    09/07/19 1337  09/10/19 0312  WBC 6.7 7.3  HGB 12.7 11.5*  HCT 38.6 36.3  MCV 89 92.6  PLT 219 175   Basic Metabolic Panel Recent Labs    09/12/19 1337 09/10/19 0312  NA 149* 140  K 3.8 3.2*  CL 93* 108  CO2 25 26  GLUCOSE 87 110*  BUN 24 14  CREATININE 0.88 0.69  CALCIUM 10.0 8.9   Liver Function Tests No results for input(s): AST, ALT, ALKPHOS, BILITOT, PROT, ALBUMIN in the last 72 hours. No results for input(s): LIPASE, AMYLASE in the last 72 hours. Cardiac Enzymes No results for input(s): CKTOTAL, CKMB, CKMBINDEX, TROPONINI in the last 72 hours. BNP Invalid input(s): POCBNP D-Dimer No results for input(s): DDIMER in the last 72 hours. Hemoglobin A1C No results for input(s): HGBA1C in the last 72 hours. Fasting Lipid Panel Recent Labs    09/10/19 0312  CHOL 146  HDL 48  LDLCALC 78  TRIG 100  CHOLHDL 3.0   Thyroid Function Tests No results for input(s): TSH, T4TOTAL, T3FREE, THYROIDAB in the last 72 hours.  Invalid input(s): FREET3 _____________  CARDIAC CATHETERIZATION  Addendum Date: 09/09/2019    Previously placed Mid LAD stent (unknown type) is widely patent.  Prox RCA lesion is 95% stenosed. Orbital atherectomy was performed.  A drug-eluting stent was successfully placed using a SYNERGY XD 3.50X24. Stent postdilated to 3.75 mm.  Post intervention, there is a 0% residual stenosis.  Mid RCA lesion is 99% stenosed. Orbital atherectomy was performed.  A drug-eluting stent was successfully placed using a SYNERGY XD 3.0X38. Proximal stent was postdilated to 3.75 mm.  Post intervention, there is a 0% residual stenosis.  The stents overlap and were optimized with IVUS.  LV end diastolic pressure is mildly elevated.  There is no aortic valve stenosis.  RPAV lesion is 75% stenosed. Lesion in small, distal vessel.  Watch overnight given that she is visually impaired. Continue aggressive secondary prevention. Consider clopidogrel monotherapy after 6 months given diffuse  disease and long stented areas.   Result Date: 09/09/2019  Previously placed Mid LAD stent (unknown type) is widely patent.  Prox RCA lesion is 95% stenosed. Orbital atherectomy was performed.  A drug-eluting stent was successfully placed using a SYNERGY XD 3.50X24. Stent postdilated to 3.75 mm.  Post intervention, there is a 0% residual stenosis.  Mid RCA lesion is 99% stenosed. Orbital atherectomy was performed.  A drug-eluting stent was successfully placed using a SYNERGY XD 3.0X38. Proximal stent was postdilated to 3.75 mm.  Post intervention, there is a 0% residual stenosis.  The stents overlap and were optimized with IVUS.  LV end diastolic pressure is mildly elevated.  There is no aortic valve stenosis.  Watch overnight given that she is visually impaired. Continue aggressive secondary prevention. Consider clopidogrel monotherapy after 6 months given diffuse disease and long stented areas.   Disposition   Pt is being discharged home today in good condition.  Follow-up Plans & Appointments    Follow-up Information    09/11/2019, MD Follow up on 09/20/2019.   Specialty: Cardiology Why: at 4pm for your follow  up appt Contact information: [address] [place] [address] [phone]          Discharge Instructions    Amb Referral to Cardiac Rehabilitation   Complete by: As directed    Diagnosis: Coronary Stents   After initial evaluation and assessments completed: Virtual Based Care may be provided alone or in conjunction with Phase 2 Cardiac Rehab based on patient barriers.: Yes   Diet - low sodium heart healthy   Complete by: As directed    Discharge instructions   Complete by: As directed    Groin Site Care Refer to this sheet in the next few weeks. These instructions provide you with information on caring for yourself after your procedure. Your caregiver may also give you more specific instructions. Your treatment has been planned according  to current medical practices, but problems sometimes occur. Call your caregiver if you have any problems or questions after your procedure. HOME CARE INSTRUCTIONS You may shower 24 hours after the procedure. Remove the bandage (dressing) and gently wash the site with plain soap and water. Gently pat the site dry.  Do not apply powder or lotion to the site.  Do not sit in a bathtub, swimming pool, or whirlpool for 5 to 7 days.  No bending, squatting, or lifting anything over 10 pounds (4.5 kg) as directed by your caregiver.  Inspect the site at least twice daily.  Do not drive home if you are discharged the same day of the procedure. Have someone else drive you.  You may drive 24 hours after the procedure unless otherwise instructed by your caregiver.  What to expect: Any bruising will usually fade within 1 to 2 weeks.  Blood that collects in the tissue (hematoma) may be painful to the touch. It should usually decrease in size and tenderness within 1 to 2 weeks.  SEEK IMMEDIATE MEDICAL CARE IF: You have unusual pain at the groin site or down the affected leg.  You have redness, warmth, swelling, or pain at the groin site.  You have drainage (other than a small amount of blood on the dressing).  You have chills.  You have a fever or persistent symptoms for more than 72 hours.  You have a fever and your symptoms suddenly get worse.  Your leg becomes pale, cool, tingly, or numb.  You have heavy bleeding from the site. Hold pressure on the site. .   Increase activity slowly   Complete by: As directed       Discharge Medications   Allergies as of 09/10/2019   No Known Allergies     Medication List    TAKE these medications   aspirin 81 MG chewable tablet Chew 1 tablet (81 mg total) by mouth daily.   clopidogrel 75 MG tablet Commonly known as: PLAVIX Take 1 tablet (75 mg total) by mouth daily with breakfast.   furosemide 20 MG tablet Commonly known as: LASIX Take 1 tablet (20 mg  total) by mouth daily.   hydrochlorothiazide 25 MG tablet Commonly known as: HYDRODIURIL Take 25 mg by mouth at bedtime.   hydroxypropyl methylcellulose / hypromellose 2.5 % ophthalmic solution Commonly known as: ISOPTO TEARS / GONIOVISC Place 1 drop into both eyes 4 (four) times daily as needed for dry eyes.   metoprolol tartrate 25 MG tablet Commonly known as: LOPRESSOR Take 0.5 tablets (12.5 mg total) by mouth 2 (two) times daily.   nitroGLYCERIN 0.4 MG SL tablet Commonly known as: NITROSTAT Place 0.4  mg under the tongue every 5 (five) minutes as needed for chest pain.   rosuvastatin 20 MG tablet Commonly known as: CRESTOR Take 20 mg by mouth 3 (three) times a week.   VITAMIN C PO Take 1 tablet by mouth every morning.   VITAMIN D PO Take 1 tablet by mouth daily.        Aspirin prescribed at discharge?  Yes High Intensity Statin Prescribed? (Lipitor 40-80mg  or Crestor 20-40mg ): Yes Beta Blocker Prescribed? Yes For EF <40%, was ACEI/ARB Prescribed? No: N/a ADP Receptor Inhibitor Prescribed? (i.e. Plavix etc.-Includes Medically Managed Patients): Yes For EF <40%, Aldosterone Inhibitor Prescribed? No: N/a Was EF assessed during THIS hospitalization? No: assessed on recent cath Was Cardiac Rehab II ordered? (Included Medically managed Patients): Yes   Outstanding Labs/Studies   N/a  Duration of Discharge Encounter   Greater than 30 minutes including physician time.  Signed, Laverda Page NP-C 09/10/2019, 12:29 PM

## 2019-09-10 NOTE — Discharge Instructions (Signed)
Coronary Artery Disease, Female Coronary artery disease (CAD) is a condition in which the arteries that lead to the heart (coronary arteries) become narrow or blocked. The narrowing or blockage can lead to decreased blood flow to the heart. Prolonged reduced blood flow can cause a heart attack (myocardial infarction or MI). This condition may also be called coronary heart disease. Because CAD is the leading cause of death in women, it is important to understand what causes this condition and how it is treated. What are the causes? CAD is most often caused by atherosclerosis. This is the buildup of fat and cholesterol (plaque) on the inside of the arteries. Over time, the plaque may narrow or block the artery, reducing blood flow to the heart. Plaque can also become weak and break off within a coronary artery and cause a sudden blockage. Other less common causes of CAD include:  A blood clot or a piece of a blood clot or other substance that blocks the flow of blood in a coronary artery (embolism).  A tearing of the artery (spontaneous coronary artery dissection).  An enlargement of an artery (aneurysm).  Inflammation (vasculitis) in the artery wall. What increases the risk? The following factors may make you more likely to develop this condition:  Age. Women over age 55 are at a greater risk of CAD.  Family history of CAD.  High blood pressure (hypertension).  Diabetes.  High cholesterol levels.  Tobacco use.  Lack of exercise.  Menopause. ? All postmenopausal women are at greater risk of CAD. ? Women who have experienced menopause between the ages of 40-45 (early menopause) are at a higher risk of CAD. ? Women who have experienced menopause before age 40 (premature menopause) are at a very high risk of CAD.  Excessive alcohol use.  A diet high in saturated and trans fats, such as fried food and processed meat. Other possible risk factors include:  High stress  levels.  Depression.  Obesity.  Sleep apnea. What are the signs or symptoms? Many people do not have any symptoms during the early stages of CAD. As the condition progresses, symptoms may include:  Chest pain (angina). The pain can: ? Feel like crushing or squeezing, or like a tightness, pressure, fullness, or heaviness in the chest. ? Last more than a few minutes or can stop and recur. The pain tends to get worse with exercise or stress and to fade with rest.  Pain in the arms, neck, jaw, ear, or back.  Unexplained heartburn or indigestion.  Shortness of breath.  Nausea.  Sudden cold sweats.  Sudden light-headedness.  Fluttering or fast heartbeat (palpitations). Many women have chest discomfort and the other symptoms. However, women often have unusual (atypical) symptoms, such as:  Fatigue.  Vomiting.  Unexplained feelings of nervousness or anxiety.  Unexplained weakness.  Dizziness or fainting. How is this diagnosed? This condition is diagnosed based on:  Your family and medical history.  A physical exam.  Tests, including: ? A test to check the electrical signals in your heart (electrocardiogram). ? Exercise stress test. This looks for signs of blockage when the heart is stressed with exercise, such as running on a treadmill. ? Pharmacologic stress test. This test looks for signs of blockage when the heart is being stressed with a medicine. ? Blood tests. ? Coronary angiogram. This is a procedure to look at the coronary arteries to see if there is any blockage. During this test, a dye is injected into your arteries so they   appear on an X-ray. ? Coronary artery CT scan. This CT scan helps detect calcium deposits in your coronary arteries. Calcium deposits are an indicator of CAD. ? A test that uses sound waves to take a picture of your heart (echocardiogram). ? Chest X-ray. How is this treated? This condition may be treated by:  Healthy lifestyle changes to  reduce risk factors.  Medicines such as: ? Antiplatelet medicines and blood-thinning medicines, such as aspirin. These help to prevent blood clots. ? Nitroglycerin. ? Blood pressure medicines. ? Cholesterol-lowering medicine.  Coronary angioplasty and stenting. During this procedure, a thin, flexible tube is inserted through a blood vessel and into a blocked artery. A balloon or similar device on the end of the tube is inflated to open up the artery. In some cases, a small, mesh tube (stent) is inserted into the artery to keep it open.  Coronary artery bypass surgery. During this surgery, veins or arteries from other parts of the body are used to create a bypass around the blockage and allow blood to reach your heart. Follow these instructions at home: Medicines  Take over-the-counter and prescription medicines only as told by your health care provider.  Do not take the following medicines unless your health care provider approves: ? NSAIDs, such as ibuprofen, naproxen, or celecoxib. ? Vitamin supplements that contain vitamin A, vitamin E, or both. ? Hormone replacement therapy that contains estrogen with or without progestin. Lifestyle  Follow an exercise program approved by your health care provider. Aim for 150 minutes of moderate exercise or 75 minutes of vigorous exercise each week.  Maintain a healthy weight or lose weight as approved by your health care provider.  Learn to manage stress or try to limit your stress. Ask your health care provider for suggestions if you need help.  Get screened for depression and seek treatment, if needed.  Do not use any products that contain nicotine or tobacco, such as cigarettes, e-cigarettes, and chewing tobacco. If you need help quitting, ask your health care provider.  Do not use illegal drugs. Eating and drinking   Follow a heart-healthy diet. A dietitian can help educate you about healthy food options and changes. In general, eat  plenty of fruits and vegetables, lean meats, and whole grains.  Avoid foods high in: ? Sugar. ? Salt (sodium). ? Saturated fats, such as processed or fatty meat. ? Trans fats, such as fried food.  Use healthy cooking methods such as roasting, grilling, broiling, baking, poaching, steaming, or stir-frying.  Do not drink alcohol if: ? Your health care provider tells you not to drink. ? You are pregnant, may be pregnant, or are planning to become pregnant.  If you drink alcohol: ? Limit how much you have to 0-1 drink a day. ? Be aware of how much alcohol is in your drink. In the [place] one drink equals one 12 oz bottle of beer (355 mL), one 5 oz glass of wine (148 mL), or one 1 oz glass of hard liquor (44 mL). General instructions  Manage any other health conditions, such as hypertension and diabetes. These conditions affect your heart.  Your health care provider may ask you to monitor your blood pressure. Ideally, your blood pressure should be below 130/80.  Keep all follow-up visits as told by your health care provider. This is important. Get help right away if:  You have pain in your chest, neck, ear, arm, jaw, stomach, or back that: ? Lasts more than a few minutes. ?   Is recurring. ? Is not relieved by taking medicine under your tongue (sublingual nitroglycerin).  You have profuse sweating without cause.  You have unexplained: ? Heartburn or indigestion. ? Shortness of breath or difficulty breathing. ? Fluttering or fast heartbeat (palpitations). ? Nausea or vomiting. ? Fatigue. ? Feelings of nervousness or anxiety. ? Weakness. ? Diarrhea.  You have sudden light-headedness or dizziness.  You faint.  You feel like hurting yourself or think about taking your own life. These symptoms may represent a serious problem that is an emergency. Do not wait to see if the symptoms will go away. Get medical help right away. Call your local emergency services (911 in the [place] Do  not drive yourself to the hospital. Summary  Coronary artery disease (CAD) is a condition in which the arteries that lead to the heart (coronary arteries) become narrow or blocked. The narrowing or blockage can lead to a heart attack.  Many women have chest discomfort and other common symptoms of CAD. However, women often have unusual (atypical) symptoms, such as fatigue, vomiting, weakness, or dizziness.  CAD can be treated with lifestyle changes, medicines, surgery, or a combination of these treatments. This information is not intended to replace advice given to you by your health care provider. Make sure you discuss any questions you have with your health care provider. Document Revised: 12/19/2017 Document Reviewed: 12/09/2017 [employer] Patient Education  2020 [employer]  

## 2019-09-20 ENCOUNTER — Ambulatory Visit: Payer: Medicare Other | Admitting: Cardiology

## 2019-09-20 ENCOUNTER — Encounter: Payer: Self-pay | Admitting: Cardiology

## 2019-09-20 ENCOUNTER — Other Ambulatory Visit: Payer: Self-pay

## 2019-09-20 ENCOUNTER — Encounter: Payer: Self-pay | Admitting: *Deleted

## 2019-09-20 VITALS — BP 110/50 | HR 84 | Ht 65.0 in | Wt 129.0 lb

## 2019-09-20 DIAGNOSIS — H548 Legal blindness, as defined in USA: Secondary | ICD-10-CM

## 2019-09-20 DIAGNOSIS — E782 Mixed hyperlipidemia: Secondary | ICD-10-CM | POA: Diagnosis not present

## 2019-09-20 DIAGNOSIS — I2510 Atherosclerotic heart disease of native coronary artery without angina pectoris: Secondary | ICD-10-CM | POA: Diagnosis not present

## 2019-09-20 DIAGNOSIS — I200 Unstable angina: Secondary | ICD-10-CM | POA: Diagnosis not present

## 2019-09-20 NOTE — Progress Notes (Signed)
Cardiology Office Note:    Date:  09/20/2019   ID:  Kerri Snyder, DOB 1940/05/03, MRN [id number]  PCP:  Merri Brunette, MD  [facility] Cardiologist:  Donato Schultz, MD  [facility] Electrophysiologist:  None   Referring MD: Merri Brunette, MD     History of Present Illness:    Kerri Snyder is a 79 y.o. female with a hx of CAD see below   Previously placed Mid LAD stent (unknown type) is widely patent.  Prox RCA lesion is 95% stenosed. Orbital atherectomy was performed.  A drug-eluting stent was successfully placed using a SYNERGY XD 3.50X24. Stent postdilated to 3.75 mm.  Post intervention, there is a 0% residual stenosis.  Mid RCA lesion is 99% stenosed. Orbital atherectomy was performed.  A drug-eluting stent was successfully placed using a SYNERGY XD 3.0X38. Proximal stent was postdilated to 3.75 mm.  Post intervention, there is a 0% residual stenosis.  The stents overlap and were optimized with IVUS.  LV end diastolic pressure is mildly elevated.  There is no aortic valve stenosis.  RPAV lesion is 75% stenosed. Lesion in small, distal vessel.   Watch overnight given that she is visually impaired.   Continue aggressive secondary prevention.   Consider clopidogrel monotherapy after 6 months given diffuse disease and long stented areas.   Diagnostic Dominance: Right  Intervention     From prior note: She has been experiencing discomfort with heavy exertion with jaw/throat and arm pain.Mild SOB. When going up hill from bus stop.  No CP. Still works for for [employer] severe.  Was given nitroglycerin just in case.  This has been ongoing.  EKG performed showed inferolateral T wave changes that appear ischemic.  Her prior EKG in 2015 did not show these changes.  Mom-pacer, dad-heart problems, brother-had MI all had heart issues.   She has never smoked.  No diabetes.  Does have a history of hyperlipidemia hypertension.  Has been on Plavix  secondary to prior PFO noted on echocardiogram in 2015.  Bilateral carotid endarterectomies done 2015.  She has had trouble with some of the statins in the past such as Crestor causing leg pain and stomach upset and Livalo causing severe leg cramps.  She has had a stroke-taking Plavix.  Stroke was in 2015 with acute left basal ganglia infarct.    Past Medical History:  Diagnosis Date  . Albinism (HCC)   . Anxiety disorder, unspecified   . Blind    legally blind  . Carotid artery occlusion   . Cerebrovascular disease, unspecified   . Coronary artery disease   . DDD (degenerative disc disease), lumbar   . Diverticulitis large intestine w/o perforation or abscess w/o bleeding   . Exercise-induced angina (HCC)   . Family history of anesthesia complication    cousin with nausea and vomiting post anesthesia  . First degree hemorrhoids   . Hereditary and idiopathic neuropathy, unspecified   . Hyperlipidemia   . Hypertension   . Osteopenia   . PFO (patent foramen ovale)   . Sciatica   . Stable angina (HCC)   . Stroke [facility]) 3/15    Past Surgical History:  Procedure Laterality Date  . CAROTID ENDARTERECTOMY Left June 25, 2013   cea  . CAROTID ENDARTERECTOMY Right August 06, 2013   cea  . CESAREAN SECTION    . CORONARY ATHERECTOMY N/A 09/09/2019   Procedure: CORONARY ATHERECTOMY;  Surgeon: Corky Crafts, MD;  Location: [facility] INVASIVE CV LAB;  Service:  Cardiovascular;  Laterality: N/A;  . CORONARY STENT INTERVENTION N/A 08/10/2019   Procedure: CORONARY STENT INTERVENTION;  Surgeon: Corky Crafts, MD;  Location: [facility] INVASIVE CV LAB;  Service: Cardiovascular;  Laterality: N/A;  . CORONARY STENT INTERVENTION  09/09/2019  . CORONARY STENT INTERVENTION N/A 09/09/2019   Procedure: CORONARY STENT INTERVENTION;  Surgeon: Corky Crafts, MD;  Location: [facility] INVASIVE CV LAB;  Service: Cardiovascular;  Laterality: N/A;  . Ear drum replacement 2000 Right 2000  .  ENDARTERECTOMY Left 06/25/2013   Procedure: ENDARTERECTOMY CAROTID;  Surgeon: Larina Earthly, MD;  Location: [facility] OR;  Service: Vascular;  Laterality: Left;  . ENDARTERECTOMY Right 08/06/2013   Procedure:  Carotid Endarterectomy with hemashield patch angioplasty;  Surgeon: Larina Earthly, MD;  Location: [facility] OR;  Service: Vascular;  Laterality: Right;  . INTRAVASCULAR ULTRASOUND/IVUS N/A 08/10/2019   Procedure: Intravascular Ultrasound/IVUS;  Surgeon: Corky Crafts, MD;  Location: [facility] INVASIVE CV LAB;  Service: Cardiovascular;  Laterality: N/A;  . INTRAVASCULAR ULTRASOUND/IVUS N/A 09/09/2019   Procedure: Intravascular Ultrasound/IVUS;  Surgeon: Corky Crafts, MD;  Location: [facility] INVASIVE CV LAB;  Service: Cardiovascular;  Laterality: N/A;  . LEFT HEART CATH AND CORONARY ANGIOGRAPHY N/A 08/10/2019   Procedure: LEFT HEART CATH AND CORONARY ANGIOGRAPHY;  Surgeon: Corky Crafts, MD;  Location: [facility] INVASIVE CV LAB;  Service: Cardiovascular;  Laterality: N/A;  . LEFT HEART CATH AND CORONARY ANGIOGRAPHY N/A 09/09/2019   Procedure: LEFT HEART CATH AND CORONARY ANGIOGRAPHY;  Surgeon: Corky Crafts, MD;  Location: [facility] INVASIVE CV LAB;  Service: Cardiovascular;  Laterality: N/A;  . TEE WITHOUT CARDIOVERSION N/A 06/17/2013   Procedure: TRANSESOPHAGEAL ECHOCARDIOGRAM (TEE);  Surgeon: Vesta Mixer, MD;  Location: [facility] ENDOSCOPY;  Service: Cardiovascular;  Laterality: N/A;  . TONSILLECTOMY    . TUBAL LIGATION      Current Medications: Current Meds  Medication Sig  . Ascorbic Acid (VITAMIN C PO) Take 1 tablet by mouth every morning.  Marland Kitchen aspirin 81 MG chewable tablet Chew 1 tablet (81 mg total) by mouth daily.  . Cholecalciferol (VITAMIN D PO) Take 1 tablet by mouth daily.   . clopidogrel (PLAVIX) 75 MG tablet Take 1 tablet (75 mg total) by mouth daily with breakfast.  . furosemide (LASIX) 20 MG tablet Take 1 tablet (20 mg total) by mouth daily.  . hydrochlorothiazide (HYDRODIURIL) 25 MG tablet Take  25 mg by mouth at bedtime.  . hydroxypropyl methylcellulose (ISOPTO TEARS) 2.5 % ophthalmic solution Place 1 drop into both eyes 4 (four) times daily as needed for dry eyes.  . metoprolol tartrate (LOPRESSOR) 25 MG tablet Take 0.5 tablets (12.5 mg total) by mouth 2 (two) times daily.  . nitroGLYCERIN (NITROSTAT) 0.4 MG SL tablet Place 0.4 mg under the tongue every 5 (five) minutes as needed for chest pain.   . rosuvastatin (CRESTOR) 20 MG tablet Take 20 mg by mouth 3 (three) times a week.     Allergies:   Patient has no known allergies.   Social History   Socioeconomic History  . Marital status: Married    Spouse name: Not on file  . Number of children: Not on file  . Years of education: Not on file  . Highest education level: Not on file  Occupational History  . Not on file  Tobacco Use  . Smoking status: Never Smoker  . Smokeless tobacco: Never Used  Substance and Sexual Activity  . Alcohol use: No    Alcohol/week: 0.0 standard drinks  . Drug use:  No  . Sexual activity: Never  Other Topics Concern  . Not on file  Social History Narrative  . Not on file   Social Determinants of Health   Financial Resource Strain:   . Difficulty of Paying Living Expenses:   Food Insecurity:   . Worried About Programme researcher, broadcasting/film/video in the Last Year:   . Barista in the Last Year:   Transportation Needs:   . Freight forwarder (Medical):   Marland Kitchen Lack of Transportation (Non-Medical):   Physical Activity:   . Days of Exercise per Week:   . Minutes of Exercise per Session:   Stress:   . Feeling of Stress :   Social Connections:   . Frequency of Communication with Friends and Family:   . Frequency of Social Gatherings with Friends and Family:   . Attends Religious Services:   . Active Member of Clubs or Organizations:   . Attends Banker Meetings:   Marland Kitchen Marital Status:      Family History: The patient's family history includes Alcohol abuse in her father; Alzheimer's  disease in her mother; Diabetes in her father; Heart disease in her father.  ROS:   Please see the history of present illness.     All other systems reviewed and are negative.  EKGs/Labs/Other Studies Reviewed:    The following studies were reviewed today: Catheterization reviewed  Recent Labs: 09/10/2019: BUN 14; Creatinine, Ser 0.69; Hemoglobin 11.5; Platelets 175; Potassium 3.2; Sodium 140  Recent Lipid Panel    Component Value Date/Time   CHOL 146 09/10/2019 0312   TRIG 100 09/10/2019 0312   HDL 48 09/10/2019 0312   CHOLHDL 3.0 09/10/2019 0312   VLDL 20 09/10/2019 0312   LDLCALC 78 09/10/2019 0312    Physical Exam:    VS:  BP (!) 110/50   Pulse 84   Ht 5\' 5"  (1.651 m)   Wt 129 lb (58.5 kg)   SpO2 97%   BMI 21.47 kg/m     Wt Readings from Last 3 Encounters:  09/20/19 129 lb (58.5 kg)  09/10/19 129 lb (58.5 kg)  08/24/19 130 lb (59 kg)     GEN:  Well nourished, well developed in no acute distress HEENT: Blind NECK: No JVD; No carotid bruits LYMPHATICS: No lymphadenopathy CARDIAC: RRR, no murmurs, rubs, gallops RESPIRATORY:  Clear to auscultation without rales, wheezing or rhonchi  ABDOMEN: Soft, non-tender, non-distended MUSCULOSKELETAL:  No edema; No deformity  SKIN: Warm and dry NEUROLOGIC:  Alert and oriented x 3 PSYCHIATRIC:  Normal affect   ASSESSMENT:    1. Coronary artery disease involving native coronary artery of native heart without angina pectoris   2. Unstable angina (HCC)   3. Mixed hyperlipidemia   4. Legally blind    PLAN:    In order of problems listed above:  Coronary artery disease -Most recent atherectomy procedure did very well.  Doing well no anginal symptoms no bleeding.  Continue with dual antiplatelet therapy for 6 months.  After that Plavix monotherapy. -She is opted not to go forward with cardiac rehab.  She is quite active at work. -During prior catheterization she did have some shortness of breath went to the beach,  dyspnea.  Urgent care, mild pleural effusion Lasix was given and she felt better.  Mixed hyperlipidemia -She is able to tolerate the Crestor 20 mg 3 times a week.  She does have some leg pain associated with this that is mild.  She is  continuing to push through.  She states.  Prior stroke 2015 -Status post bilateral carotid endarterectomy -On Plavix -Small PFO on TEE.  Medically managed.  Legally blind -She was here by herself in the office setting she is able to get around with assistance.  She can still see shapes etc.  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   Medication Adjustments/Labs and Tests Ordered: Current medicines are reviewed at length with the patient today.  Concerns regarding medicines are outlined above.  No orders of the defined types were placed in this encounter.  No orders of the defined types were placed in this encounter.   Patient Instructions  Medication Instructions:  The current medical regimen is effective;  continue present plan and medications.  *If you need a refill on your cardiac medications before your next appointment, please call your pharmacy*  Follow-Up: At [facility], you and your health needs are our priority.  As part of our continuing mission to provide you with exceptional heart care, we have created designated Provider Care Teams.  These Care Teams include your primary Cardiologist (physician) and Advanced Practice Providers (APPs -  Physician Assistants and Nurse Practitioners) who all work together to provide you with the care you need, when you need it.  We recommend signing up for the patient portal called "MyChart".  Sign up information is provided on this After Visit Summary.  MyChart is used to connect with patients for Virtual Visits (Telemedicine).  Patients are able to view lab/test results, encounter notes, upcoming appointments, etc.  Non-urgent  messages can be sent to your provider as well.   To learn more about what you can do with MyChart, go to [email or web address].    Your next appointment:   6 month(s)  The format for your next appointment:   In Person  Provider:   Candee Furbish, MD   Thank you for choosing [facility]!!         Signed, Candee Furbish, MD  09/20/2019 4:52 PM    [place]

## 2019-09-20 NOTE — Patient Instructions (Signed)

## 2019-09-29 ENCOUNTER — Encounter (HOSPITAL_COMMUNITY): Payer: Self-pay | Admitting: Interventional Cardiology

## 2020-04-11 ENCOUNTER — Ambulatory Visit: Payer: Medicare Other | Admitting: Cardiology

## 2020-04-11 ENCOUNTER — Encounter: Payer: Self-pay | Admitting: Cardiology

## 2020-04-11 ENCOUNTER — Other Ambulatory Visit: Payer: Self-pay

## 2020-04-11 VITALS — BP 122/60 | HR 69 | Ht 65.0 in | Wt 132.0 lb

## 2020-04-11 DIAGNOSIS — I2510 Atherosclerotic heart disease of native coronary artery without angina pectoris: Secondary | ICD-10-CM | POA: Diagnosis not present

## 2020-04-11 DIAGNOSIS — I6523 Occlusion and stenosis of bilateral carotid arteries: Secondary | ICD-10-CM | POA: Diagnosis not present

## 2020-04-11 DIAGNOSIS — E782 Mixed hyperlipidemia: Secondary | ICD-10-CM | POA: Diagnosis not present

## 2020-04-11 NOTE — Patient Instructions (Signed)
Medication Instructions:  Please discontinue your Aspirin and Furosemide. Please take Crestor 20 mg once a week if you can tolerate it. Continue all other medications as listed.  *If you need a refill on your cardiac medications before your next appointment, please call your pharmacy*  Follow-Up: At [facility], you and your health needs are our priority.  As part of our continuing mission to provide you with exceptional heart care, we have created designated Provider Care Teams.  These Care Teams include your primary Cardiologist (physician) and Advanced Practice Providers (APPs -  Physician Assistants and Nurse Practitioners) who all work together to provide you with the care you need, when you need it.  We recommend signing up for the patient portal called "MyChart".  Sign up information is provided on this After Visit Summary.  MyChart is used to connect with patients for Virtual Visits (Telemedicine).  Patients are able to view lab/test results, encounter notes, upcoming appointments, etc.  Non-urgent messages can be sent to your provider as well.   To learn more about what you can do with MyChart, go to [email or web address].    Your next appointment:   12 month(s)  The format for your next appointment:   In Person  Provider:   Donato Schultz, MD   Thank you for choosing [facility]!!

## 2020-04-11 NOTE — Progress Notes (Signed)
Cardiology Office Note:    Date:  04/11/2020   ID:  Kerri Snyder, DOB 11/24/1940, MRN [id number]  PCP:  Merri BrunetteSmith, Candace, MD  Cardiologist:  Donato SchultzMark Jeanny Rymer, MD  Electrophysiologist:  None   Referring MD: Merri BrunetteSmith, Candace, MD     History of Present Illness:    Kerri Snyder is a 79 y.o. female here for the follow-up of coronary artery disease with LAD intervention with left to right collaterals to the RCA 09/09/2019.    She has a residual 99% RCA lesion which will subsequently be intervened upon in the next few weeks.   [place] of dys[place] Urgent care. CXR with mild pleural effusions. Lasix given and helped.    Overall she has been doing quite well.  She did stop her Crestor because of leg pain.  Does not wish to try again.  No shortness of breath.  No chest pain.  No longer on any furosemide.   From prior note: She has been experiencing discomfort with heavy exertion with jaw/throat and arm pain.Mild SOB. When going up hill from bus stop.  No CP. Still works for for [employer].  Was given nitroglycerin just in case.  This has been ongoing.  EKG performed showed inferolateral T wave changes that appear ischemic.  Her prior EKG in 2015 did not show these changes.  Mom-pacer, dad-heart problems, brother-had MI all had heart issues.   She has never smoked.  No diabetes.  Does have a history of hyperlipidemia hypertension.  Has been on Plavix secondary to prior PFO noted on echocardiogram in 2015.  Bilateral carotid endarterectomies done 2015.  She has had trouble with some of the statins in the past such as Crestor causing leg pain and stomach upset and Livalo causing severe leg cramps.  She has had a stroke-taking Plavix.  Stroke was in 2015 with acute left basal ganglia infarct.  Past Medical History:  Diagnosis Date  . Albinism (HCC)   . Anxiety disorder, unspecified   . Blind    legally blind  . Carotid artery occlusion   . Cerebrovascular  disease, unspecified   . Coronary artery disease   . DDD (degenerative disc disease), lumbar   . Diverticulitis large intestine w/o perforation or abscess w/o bleeding   . Exercise-induced angina (HCC)   . Family history of anesthesia complication    cousin with nausea and vomiting post anesthesia  . First degree hemorrhoids   . Hereditary and idiopathic neuropathy, unspecified   . Hyperlipidemia   . Hypertension   . Osteopenia   . PFO (patent foramen ovale)   . Sciatica   . Stable angina (HCC)   . Stroke [facility](HCC) 3/15    Past Surgical History:  Procedure Laterality Date  . CAROTID ENDARTERECTOMY Left June 25, 2013   cea  . CAROTID ENDARTERECTOMY Right August 06, 2013   cea  . CESAREAN SECTION    . CORONARY ATHERECTOMY N/A 09/09/2019   Procedure: CORONARY ATHERECTOMY;  Surgeon: Corky CraftsVaranasi, Jayadeep S, MD;  [facility];  Service: Cardiovascular;  Laterality: N/A;  . CORONARY STENT INTERVENTION N/A 08/10/2019   Procedure: CORONARY STENT INTERVENTION;  Surgeon: Corky CraftsVaranasi, Jayadeep S, MD;  [facility];  Service: Cardiovascular;  Laterality: N/A;  . CORONARY STENT INTERVENTION  09/09/2019  . CORONARY STENT INTERVENTION N/A 09/09/2019   Procedure: CORONARY STENT INTERVENTION;  Surgeon: Corky CraftsVaranasi, Jayadeep S, MD;  [facility];  Service: Cardiovascular;  Laterality: N/A;  . Ear drum replacement 2000 Right 2000  . ENDARTERECTOMY Left 06/25/2013   Procedure: ENDARTERECTOMY CAROTID;  Surgeon: Larina Earthly, MD;  Location: [facility] OR;  Service: Vascular;  Laterality: Left;  . ENDARTERECTOMY Right 08/06/2013   Procedure:  Carotid Endarterectomy with hemashield patch angioplasty;  Surgeon: Larina Earthly, MD;  Location: [facility] OR;  Service: Vascular;  Laterality: Right;  . INTRAVASCULAR ULTRASOUND/IVUS N/A 08/10/2019   Procedure: Intravascular Ultrasound/IVUS;  Surgeon: Corky Crafts, MD;  Location[facility] INVASIVE CV LAB;  Service: Cardiovascular;  Laterality:  N/A;  . INTRAVASCULAR ULTRASOUND/IVUS N/A 09/09/2019   Procedure: Intravascular Ultrasound/IVUS;  Surgeon: Corky Crafts, MD;  Location: [facility] INVASIVE CV LAB;  Service: Cardiovascular;  Laterality: N/A;  . LEFT HEART CATH AND CORONARY ANGIOGRAPHY N/A 08/10/2019   Procedure: LEFT HEART CATH AND CORONARY ANGIOGRAPHY;  Surgeon: Corky Crafts, MD;  Location: [facility] INVASIVE CV LAB;  Service: Cardiovascular;  Laterality: N/A;  . LEFT HEART CATH AND CORONARY ANGIOGRAPHY N/A 09/09/2019   Procedure: LEFT HEART CATH AND CORONARY ANGIOGRAPHY;  Surgeon: Corky Crafts, MD;  Location: [facility] INVASIVE CV LAB;  Service: Cardiovascular;  Laterality: N/A;  . TEE WITHOUT CARDIOVERSION N/A 06/17/2013   Procedure: TRANSESOPHAGEAL ECHOCARDIOGRAM (TEE);  Surgeon: Vesta Mixer, MD;  Location: [facility] ENDOSCOPY;  Service: Cardiovascular;  Laterality: N/A;  . TONSILLECTOMY    . TUBAL LIGATION      Current Medications: Current Meds  Medication Sig  . Ascorbic Acid (VITAMIN C PO) Take 1 tablet by mouth every morning.  . Cholecalciferol (VITAMIN D PO) Take 1 tablet by mouth daily.   . clopidogrel (PLAVIX) 75 MG tablet Take 1 tablet (75 mg total) by mouth daily with breakfast.  . hydrochlorothiazide (HYDRODIURIL) 25 MG tablet Take 25 mg by mouth at bedtime.  . hydroxypropyl methylcellulose (ISOPTO TEARS) 2.5 % ophthalmic solution Place 1 drop into both eyes 4 (four) times daily as needed for dry eyes.  . metoprolol tartrate (LOPRESSOR) 25 MG tablet Take 0.5 tablets (12.5 mg total) by mouth 2 (two) times daily.  . nitroGLYCERIN (NITROSTAT) 0.4 MG SL tablet Place 0.4 mg under the tongue every 5 (five) minutes as needed for chest pain.   . [DISCONTINUED] aspirin 81 MG chewable tablet Chew 1 tablet (81 mg total) by mouth daily.  . [DISCONTINUED] furosemide (LASIX) 20 MG tablet Take 1 tablet (20 mg total) by mouth daily.     Allergies:   Patient has no known allergies.   Social History   Socioeconomic History  .  Marital status: Married    Spouse name: Not on file  . Number of children: Not on file  . Years of education: Not on file  . Highest education level: Not on file  Occupational History  . Not on file  Tobacco Use  . Smoking status: Never Smoker  . Smokeless tobacco: Never Used  Vaping Use  . Vaping Use: Never used  Substance and Sexual Activity  . Alcohol use: No    Alcohol/week: 0.0 standard drinks  . Drug use: No  . Sexual activity: Never  Other Topics Concern  . Not on file  Social History Narrative  . Not on file   Social Determinants of Health   Financial Resource Strain: Not on file  Food Insecurity: Not on file  Transportation Needs: Not on file  Physical Activity: Not on file  Stress: Not on file  Social Connections: Not on file     Family History: The patient's family history  includes Alcohol abuse in her father; Alzheimer's disease in her mother; Diabetes in her father; Heart disease in her father.  ROS:   Please see the history of present illness.      EKGs/Labs/Other Studies Reviewed:    The following studies were reviewed today: Prior echo office note from Dr. Katrinka Blazing, lab work, vascular study reviewed-patent endarterectomy  EKG: Prior:  demonstrates sinus rhythm T wave inversion inferolaterally      Recent Labs: 09/10/2019: BUN 14; Creatinine, Ser 0.69; Hemoglobin 11.5; Platelets 175; Potassium 3.2; Sodium 140  Recent Lipid Panel    Component Value Date/Time   CHOL 146 09/10/2019 0312   TRIG 100 09/10/2019 0312   HDL 48 09/10/2019 0312   CHOLHDL 3.0 09/10/2019 0312   VLDL 20 09/10/2019 0312   LDLCALC 78 09/10/2019 0312    Physical Exam:    VS:  BP 122/60 (BP Location: Right Arm, Patient Position: Sitting, Cuff Size: Normal)   Pulse 69   Ht 5\' 5"  (1.651 m)   Wt 132 lb (59.9 kg)   SpO2 97%   BMI 21.97 kg/m     Wt Readings from Last 3 Encounters:  04/11/20 132 lb (59.9 kg)  09/20/19 129 lb (58.5 kg)  09/10/19 129 lb (58.5 kg)      GEN: Well nourished, well developed, in no acute distress  HEENT: normal, blind  Neck: no JVD, carotid bruits, or masses Cardiac: RRR; no murmurs, rubs, or gallops,no edema  Respiratory:  clear to auscultation bilaterally, normal work of breathing GI: soft, nontender, nondistended, + BS MS: no deformity or atrophy  Skin: warm and dry, no rash Neuro:  Alert and Oriented x 3, Strength and sensation are intact Psych: euthymic mood, full affect   ASSESSMENT:    1. Coronary artery disease involving native coronary artery of native heart without angina pectoris   2. Bilateral carotid artery stenosis   3. Mixed hyperlipidemia    PLAN:    In order of problems listed above:  CAD with Exertional angina with abnormal EKG concerning for ischemia (08/2019) -Cardiac catheterization showed the following   Mid RCA lesion is 99% stenosed. There were left to right collaterals feeding the distal RCA.  Mid LAD lesion is 90% stenosed.  After orbital atherectomy, a drug-eluting stent was successfully placed using a SYNERGY XD 2.50X28, postdialted to 3.0 mm.  Post intervention, there is a 0% residual stenosis.  There is mild left ventricular systolic dysfunction.  The left ventricular ejection fraction is 45-50% by visual estimate.  LV end diastolic pressure is normal.  There is no aortic valve stenosis.   Continue aggressive secondary prevention including aspirin and Plavix.  We will plan to bring her back for atherectomy of the RCA at a later time.  Would likely wait a couple of weeks.  We will watch her overnight since we had to use right groin for access site. Dr. 11-14-1973   -She works for the industry for the [employer]   Mixed hyperlipidemia -We will go ahead and try Crestor 20 mg once a week.  She could not tolerate the 3 times a week because of leg pain.  I would like for her to be on some dose of statin.  She refuses. LDL 126  Prior stroke 2015 -Status post bilateral carotid  endarterectomy -On Plavix, now off of aspirin -Small PFO on TEE.  Medically managed.  Legally blind -She was here by herself in the office setting she is able to get around with assistance.  She  can still see shapes etc.  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  Excellent.  She continues to work.  Medication Adjustments/Labs and Tests Ordered: Current medicines are reviewed at length with the patient today.  Concerns regarding medicines are outlined above.  No orders of the defined types were placed in this encounter.  No orders of the defined types were placed in this encounter.   Patient Instructions  Medication Instructions:  Please discontinue your Aspirin and Furosemide. Please take Crestor 20 mg once a week if you can tolerate it. Continue all other medications as listed.  *If you need a refill on your cardiac medications before your next appointment, please call your pharmacy*  Follow-Up: At [facility], you and your health needs are our priority.  As part of our continuing mission to provide you with exceptional heart care, we have created designated Provider Care Teams.  These Care Teams include your primary Cardiologist (physician) and Advanced Practice Providers (APPs -  Physician Assistants and Nurse Practitioners) who all work together to provide you with the care you need, when you need it.  We recommend signing up for the patient portal called "MyChart".  Sign up information is provided on this After Visit Summary.  MyChart is used to connect with patients for Virtual Visits (Telemedicine).  Patients are able to view lab/test results, encounter notes, upcoming appointments, etc.  Non-urgent messages can be sent to your provider as well.   To learn more about what you can do with MyChart, go to [email or web address].    Your next appointment:   12 month(s)  The format for your next appointment:    In Person  Provider:   Donato Schultz, MD   Thank you for choosing [facility]!!         Signed, Donato Schultz, MD  04/11/2020 10:33 AM    [facility]

## 2020-06-07 ENCOUNTER — Other Ambulatory Visit: Payer: Self-pay | Admitting: Family Medicine

## 2020-06-08 ENCOUNTER — Other Ambulatory Visit: Payer: Self-pay | Admitting: Family Medicine

## 2020-06-08 DIAGNOSIS — I739 Peripheral vascular disease, unspecified: Secondary | ICD-10-CM

## 2020-06-16 ENCOUNTER — Ambulatory Visit
Admission: RE | Admit: 2020-06-16 | Discharge: 2020-06-16 | Disposition: A | Discharge status: Home / Self Care | Payer: Medicare Other | Source: Ambulatory Visit | Attending: Family Medicine | Admitting: Family Medicine

## 2020-06-16 DIAGNOSIS — I739 Peripheral vascular disease, unspecified: Secondary | ICD-10-CM

## 2020-08-07 ENCOUNTER — Other Ambulatory Visit: Payer: Self-pay | Admitting: Occupational Medicine

## 2020-08-07 ENCOUNTER — Other Ambulatory Visit: Payer: Self-pay

## 2020-08-07 ENCOUNTER — Ambulatory Visit: Payer: Self-pay

## 2020-08-07 DIAGNOSIS — M79645 Pain in left finger(s): Secondary | ICD-10-CM

## 2020-09-05 DIAGNOSIS — S62639A Displaced fracture of distal phalanx of unspecified finger, initial encounter for closed fracture: Secondary | ICD-10-CM | POA: Insufficient documentation

## 2021-02-16 ENCOUNTER — Telehealth: Payer: Self-pay | Admitting: Cardiology

## 2021-02-16 NOTE — Telephone Encounter (Signed)
The chest pain and tightness in throat occurs when she walks. Every day walking is okay but if there is incline or stairs, something more strenuous this is when the symptoms occur. This has been occurring for about a month and it has slowly gotten worse over time. If she stops and rests ~5-10 minutes the symptoms go away on there own. The patient has not used her nitro because the pain goes away on its on with rest. She has not missed any doses of medication. No new lifestyle changes. Appointment 11/10 with Dr. Anne Fu. ED precautions given and patient educated about nitro tablets.

## 2021-02-16 NOTE — Telephone Encounter (Signed)
Pt c/o of Chest Pain: STAT if CP now or developed within 24 hours  1. Are you having CP right now? Chest pain, tightness in her throat, not at this time  2. Are you experiencing any other symptoms (ex. SOB, nausea, vomiting, sweating)? Short of breath, not at this time  3. How long have you been experiencing CP? About a month, its getting worse  4. Is your CP continuous or coming and going? Comes and goes  5. Have you taken Nitroglycerin? No- patient wants to be seen- first available is Thursday()02-22-21) with Dr Anne Fu in [place]- Please call to evaluate ?

## 2021-02-16 NOTE — Telephone Encounter (Signed)
Agree with plan.  Appointment soon. Donato Schultz, MD

## 2021-02-22 ENCOUNTER — Encounter: Payer: Self-pay | Admitting: Cardiology

## 2021-02-22 ENCOUNTER — Ambulatory Visit (INDEPENDENT_AMBULATORY_CARE_PROVIDER_SITE_OTHER): Payer: Medicare Other | Admitting: Cardiology

## 2021-02-22 ENCOUNTER — Other Ambulatory Visit: Payer: Self-pay

## 2021-02-22 VITALS — BP 138/62 | HR 78 | Temp 78.0°F | Ht 65.0 in | Wt 130.0 lb

## 2021-02-22 DIAGNOSIS — I25118 Atherosclerotic heart disease of native coronary artery with other forms of angina pectoris: Secondary | ICD-10-CM | POA: Diagnosis not present

## 2021-02-22 DIAGNOSIS — E782 Mixed hyperlipidemia: Secondary | ICD-10-CM

## 2021-02-22 DIAGNOSIS — I25119 Atherosclerotic heart disease of native coronary artery with unspecified angina pectoris: Secondary | ICD-10-CM

## 2021-02-22 DIAGNOSIS — I6523 Occlusion and stenosis of bilateral carotid arteries: Secondary | ICD-10-CM

## 2021-02-22 MED ORDER — ISOSORBIDE MONONITRATE ER 30 MG PO TB24
30.0000 mg | ORAL_TABLET | Freq: Every day | ORAL | 3 refills | Status: DC
Start: 2021-02-22 — End: 2021-07-18

## 2021-02-22 NOTE — Patient Instructions (Addendum)
Medication Instructions:   START Imdur 30 mg every morning  Labwork: None today   Testing/Procedures: None today  Follow-Up: 1 month with Dr.Skains at the [facility] location   Any Other Special Instructions Will Be Listed Below (If Applicable).    You have been referred to Advanced lipid clinic. They will call you to schedule appointment   If you need a refill on your cardiac medications before your next appointment, please call your pharmacy.

## 2021-02-22 NOTE — Assessment & Plan Note (Signed)
Prior bilateral endarterectomy.  Bruits heard on exam.

## 2021-02-22 NOTE — Assessment & Plan Note (Signed)
LDL currently not at goal.  She is not wish to take statin therapy.  She still continues to have lower extremity cramping when she walks however.  I did feel her pulses and they do seem to be normal.  I would like for her to sit down with her lipid clinic to come up with alternatives for her.  Perhaps [id number] or Leqvio

## 2021-02-22 NOTE — Assessment & Plan Note (Signed)
Prior stroke in the past.  See above for details.  Continue with goal-directed medical therapy.  Also on Plavix.

## 2021-02-22 NOTE — Progress Notes (Signed)
Cardiology Office Note:    Date:  02/22/2021   ID:  Kerri Snyder, DOB 11/07/1940, MRN [id number]  PCP:  Merri Brunette, MD   [facility] Providers Cardiologist:  Donato Schultz, MD     Referring MD: Merri Brunette, MD    History of Present Illness:    Kerri Snyder is a 80 y.o. female here for 1 year follow-up of coronary artery disease.  Prior LAD intervention with left-to-right collaterals of the RCA on 09/09/2019.  Now having more angina, throat pain, discomfort like she had prior to her stent placement.  She is not currently on any antianginal medication other than low-dose metoprolol.  She also stopped taking statin therapy.  She has bilateral lower extremity cramping when walking but good overall pulses felt.  She ended up having a residual 99% RCA lesion which was intervened upon in the next few weeks.  She had an episode of dyspnea while at the beach.  Lasix seem to help.  Chest x-ray with mild pleural effusion.  She stopped her Crestor previously because of leg pain.  Did not wish to try again.  She works for the blind.  No smoking no diabetes.  Has prior hyperlipidemia hypertension.  Was on Plavix secondary to prior PFO noted on echocardiogram in 2015.  She also had bilateral carotid endarterectomies done in 2015.  She had a stroke in 2015 with acute left basal ganglia infarct.  Past Medical History:  Diagnosis Date   Albinism (HCC)    Anxiety disorder, unspecified    Blind    legally blind   Carotid artery occlusion    Cerebrovascular disease, unspecified    Coronary artery disease    DDD (degenerative disc disease), lumbar    Diverticulitis large intestine w/o perforation or abscess w/o bleeding    Exercise-induced angina (HCC)    Family history of anesthesia complication    cousin with nausea and vomiting post anesthesia   First degree hemorrhoids    Hereditary and idiopathic neuropathy, unspecified    Hyperlipidemia    Hypertension    Osteopenia     PFO (patent foramen ovale)    Sciatica    Stable angina (HCC)    Stroke (HCC) 3/15    Past Surgical History:  Procedure Laterality Date   CAROTID ENDARTERECTOMY Left June 25, 2013   cea   CAROTID ENDARTERECTOMY Right August 06, 2013   cea   CESAREAN SECTION     CORONARY ATHERECTOMY N/A 09/09/2019   Procedure: CORONARY ATHERECTOMY;  Surgeon: Corky Crafts, MD;  Location: [facility] INVASIVE CV LAB;  Service: Cardiovascular;  Laterality: N/A;   CORONARY STENT INTERVENTION N/A 08/10/2019   Procedure: CORONARY STENT INTERVENTION;  Surgeon: Corky Crafts, MD;  Location: [facility] INVASIVE CV LAB;  Service: Cardiovascular;  Laterality: N/A;   CORONARY STENT INTERVENTION  09/09/2019   CORONARY STENT INTERVENTION N/A 09/09/2019   Procedure: CORONARY STENT INTERVENTION;  Surgeon: Corky Crafts, MD;  Location: [facility] INVASIVE CV LAB;  Service: Cardiovascular;  Laterality: N/A;   Ear drum replacement 2000 Right 2000   ENDARTERECTOMY Left 06/25/2013   Procedure: ENDARTERECTOMY CAROTID;  Surgeon: Larina Earthly, MD;  Location: [facility] OR;  Service: Vascular;  Laterality: Left;   ENDARTERECTOMY Right 08/06/2013   Procedure:  Carotid Endarterectomy with hemashield patch angioplasty;  Surgeon: Larina Earthly, MD;  Location: [facility] OR;  Service: Vascular;  Laterality: Right;   INTRAVASCULAR ULTRASOUND/IVUS N/A 08/10/2019   Procedure: Intravascular Ultrasound/IVUS;  Surgeon: Corky Crafts, MD;  Location: [facility] INVASIVE CV LAB;  Service: Cardiovascular;  Laterality: N/A;   INTRAVASCULAR ULTRASOUND/IVUS N/A 09/09/2019   Procedure: Intravascular Ultrasound/IVUS;  Surgeon: Corky Crafts, MD;  Location: [facility] INVASIVE CV LAB;  Service: Cardiovascular;  Laterality: N/A;   LEFT HEART CATH AND CORONARY ANGIOGRAPHY N/A 08/10/2019   Procedure: LEFT HEART CATH AND CORONARY ANGIOGRAPHY;  Surgeon: Corky Crafts, MD;  Location: [facility] INVASIVE CV LAB;  Service: Cardiovascular;  Laterality: N/A;   LEFT HEART CATH AND CORONARY  ANGIOGRAPHY N/A 09/09/2019   Procedure: LEFT HEART CATH AND CORONARY ANGIOGRAPHY;  Surgeon: Corky Crafts, MD;  Location: [facility] INVASIVE CV LAB;  Service: Cardiovascular;  Laterality: N/A;   TEE WITHOUT CARDIOVERSION N/A 06/17/2013   Procedure: TRANSESOPHAGEAL ECHOCARDIOGRAM (TEE);  Surgeon: Vesta Mixer, MD;  Location: [facility] ENDOSCOPY;  Service: Cardiovascular;  Laterality: N/A;   TONSILLECTOMY     TUBAL LIGATION      Current Medications: Current Meds  Medication Sig   ALPRAZolam (XANAX) 0.25 MG tablet Take 0.25 mg by mouth 2 (two) times daily.   Ascorbic Acid (VITAMIN C PO) Take 1 tablet by mouth every morning.   Cholecalciferol (VITAMIN D PO) Take 1 tablet by mouth daily.    clopidogrel (PLAVIX) 75 MG tablet Take 1 tablet (75 mg total) by mouth daily with breakfast.   hydrochlorothiazide (HYDRODIURIL) 25 MG tablet Take 25 mg by mouth at bedtime.   hydroxypropyl methylcellulose (ISOPTO TEARS) 2.5 % ophthalmic solution Place 1 drop into both eyes 4 (four) times daily as needed for dry eyes.   isosorbide mononitrate (IMDUR) 30 MG 24 hr tablet Take 1 tablet (30 mg total) by mouth daily.   metoprolol tartrate (LOPRESSOR) 25 MG tablet Take 0.5 tablets (12.5 mg total) by mouth 2 (two) times daily.   nitroGLYCERIN (NITROSTAT) 0.4 MG SL tablet Place 0.4 mg under the tongue every 5 (five) minutes as needed for chest pain.      Allergies:   Patient has no known allergies.   Social History   Socioeconomic History   Marital status: Married    Spouse name: Not on file   Number of children: Not on file   Years of education: Not on file   Highest education level: Not on file  Occupational History   Not on file  Tobacco Use   Smoking status: Never   Smokeless tobacco: Never  Vaping Use   Vaping Use: Never used  Substance and Sexual Activity   Alcohol use: No    Alcohol/week: 0.0 standard drinks   Drug use: No   Sexual activity: Never  Other Topics Concern   Not on file  Social  History Narrative   Not on file   Social Determinants of Health   Financial Resource Strain: Not on file  Food Insecurity: Not on file  Transportation Needs: Not on file  Physical Activity: Not on file  Stress: Not on file  Social Connections: Not on file     Family History: The patient's family history includes Alcohol abuse in her father; Alzheimer's disease in her mother; Diabetes in her father; Heart disease in her father.  ROS:   Please see the history of present illness.     All other systems reviewed and are negative.  EKGs/Labs/Other Studies Reviewed:    The following studies were reviewed today: Cardiac catheterization 09/09/2019:   Previously placed Mid LAD stent (unknown type) is widely patent. Prox RCA lesion is 95% stenosed. Orbital atherectomy was performed. A drug-eluting stent was successfully placed  using a SYNERGY XD 3.50X24. Stent postdilated to 3.75 mm. Post intervention, there is a 0% residual stenosis. Mid RCA lesion is 99% stenosed. Orbital atherectomy was performed. A drug-eluting stent was successfully placed using a SYNERGY XD 3.0X38. Proximal stent was postdilated to 3.75 mm. Post intervention, there is a 0% residual stenosis. The stents overlap and were optimized with IVUS. LV end diastolic pressure is mildly elevated. There is no aortic valve stenosis. RPAV lesion is 75% stenosed. Lesion in small, distal vessel. Diagnostic Dominance: Right Intervention   EKG:  EKG is  ordered today.  The ekg ordered today demonstrates sinus rhythm PVC poor R wave progression 78 bpm Prior EKG shows T wave inversion inferior laterally. Recent Labs: No results found for requested labs within last 8760 hours.  Recent Lipid Panel    Component Value Date/Time   CHOL 146 09/10/2019 0312   TRIG 100 09/10/2019 0312   HDL 48 09/10/2019 0312   CHOLHDL 3.0 09/10/2019 0312   VLDL 20 09/10/2019 0312   LDLCALC 78 09/10/2019 0312     Risk Assessment/Calculations:           Physical Exam:    VS:  BP 138/62   Pulse 78   Temp (!) 78 F (25.6 C)   Ht 5\' 5"  (1.651 m)   Wt 130 lb (59 kg)   SpO2 96%   BMI 21.63 kg/m     Wt Readings from Last 3 Encounters:  02/22/21 130 lb (59 kg)  04/11/20 132 lb (59.9 kg)  09/20/19 129 lb (58.5 kg)     GEN:  Well nourished, well developed in no acute distress HEENT: Blind NECK: No JVD; bilat carotid bruits LYMPHATICS: No lymphadenopathy CARDIAC: RRR, no murmurs, rubs, gallops RESPIRATORY:  Clear to auscultation without rales, wheezing or rhonchi  ABDOMEN: Soft, non-tender, non-distended MUSCULOSKELETAL:  No edema; No deformity  SKIN: Warm and dry NEUROLOGIC:  Alert and oriented x 3 PSYCHIATRIC:  Normal affect   ASSESSMENT:    1. Coronary artery disease involving native coronary artery of native heart with other form of angina pectoris (HCC)   2. Coronary artery disease involving native coronary artery of native heart with angina pectoris (HCC)   3. Bilateral carotid artery stenosis   4. Mixed hyperlipidemia    PLAN:    In order of problems listed above:  Coronary artery disease involving native coronary artery of native heart with angina pectoris (HCC) Typical type anginal symptoms.  Known coronary artery disease.  For step will be to try isosorbide 30 mg daily.  Hopefully this will help.  She is on low-dose metoprolol.  Antianginal.  If these measures do not help, we may wish to pursue stress test to evaluate for any ischemia or perhaps pursue cardiac catheterization once again.  I will have close follow-up in approximately 1 to 2 months with APP.  Carotid stenosis Prior bilateral endarterectomy.  Bruits heard on exam.  CVA (cerebral infarction) Prior stroke in the past.  See above for details.  Continue with goal-directed medical therapy.  Also on Plavix.  Mixed hyperlipidemia LDL currently not at goal.  She is not wish to take statin therapy.  She still continues to have lower extremity  cramping when she walks however.  I did feel her pulses and they do seem to be normal.  I would like for her to sit down with her lipid clinic to come up with alternatives for her.  Perhaps PCSK9 or Leqvio       Medication  Adjustments/Labs and Tests Ordered: Current medicines are reviewed at length with the patient today.  Concerns regarding medicines are outlined above.  Orders Placed This Encounter  Procedures   AMB Referral to [facility]   EKG 12-Lead   Meds ordered this encounter  Medications   isosorbide mononitrate (IMDUR) 30 MG 24 hr tablet    Sig: Take 1 tablet (30 mg total) by mouth daily.    Dispense:  90 tablet    Refill:  3     Patient Instructions  Medication Instructions:   START Imdur 30 mg every morning  Labwork: None today   Testing/Procedures: None today  Follow-Up: 1 month with Dr.Jeanluc Wegman at the [facility] location   Any Other Special Instructions Will Be Listed Below (If Applicable).    You have been referred to Advanced lipid clinic. They will call you to schedule appointment   If you need a refill on your cardiac medications before your next appointment, please call your pharmacy.   Signed, Donato Schultz, MD  02/22/2021 1:26 PM    [facility]

## 2021-02-22 NOTE — Assessment & Plan Note (Signed)
Typical type anginal symptoms.  Known coronary artery disease.  For step will be to try isosorbide 30 mg daily.  Hopefully this will help.  She is on low-dose metoprolol.  Antianginal.  If these measures do not help, we may wish to pursue stress test to evaluate for any ischemia or perhaps pursue cardiac catheterization once again.  I will have close follow-up in approximately 1 to 2 months with APP.

## 2021-02-27 ENCOUNTER — Ambulatory Visit: Payer: Medicare Other

## 2021-02-27 DIAGNOSIS — Q211 Atrial septal defect, unspecified: Secondary | ICD-10-CM | POA: Insufficient documentation

## 2021-02-27 DIAGNOSIS — F419 Anxiety disorder, unspecified: Secondary | ICD-10-CM | POA: Insufficient documentation

## 2021-02-27 DIAGNOSIS — G609 Hereditary and idiopathic neuropathy, unspecified: Secondary | ICD-10-CM | POA: Insufficient documentation

## 2021-02-27 DIAGNOSIS — K640 First degree hemorrhoids: Secondary | ICD-10-CM | POA: Insufficient documentation

## 2021-02-27 DIAGNOSIS — I739 Peripheral vascular disease, unspecified: Secondary | ICD-10-CM | POA: Insufficient documentation

## 2021-02-27 DIAGNOSIS — K5730 Diverticulosis of large intestine without perforation or abscess without bleeding: Secondary | ICD-10-CM | POA: Insufficient documentation

## 2021-02-27 DIAGNOSIS — I10 Essential (primary) hypertension: Secondary | ICD-10-CM | POA: Insufficient documentation

## 2021-02-27 DIAGNOSIS — M8580 Other specified disorders of bone density and structure, unspecified site: Secondary | ICD-10-CM | POA: Insufficient documentation

## 2021-02-27 DIAGNOSIS — E7030 Albinism, unspecified: Secondary | ICD-10-CM | POA: Insufficient documentation

## 2021-02-27 DIAGNOSIS — I25110 Atherosclerotic heart disease of native coronary artery with unstable angina pectoris: Secondary | ICD-10-CM | POA: Insufficient documentation

## 2021-02-27 HISTORY — DX: Peripheral vascular disease, unspecified: I73.9

## 2021-02-27 NOTE — Progress Notes (Deleted)
Patient ID: Kerri Snyder                 DOB: 08/27/1940                    MRN: [id number]     HPI: Kerri Snyder is a 80 y.o. female patient referred to lipid clinic by Dr Anne Fu. PMH is significant for CAD, PVD, TIA, angina, and statin intolerance.  Current Medications: n/a Intolerances:  Risk Factors: CAD, angina, hx of TIA, HTN, CVA LDL goal: <70  Diet:   Exercise:   Family History:   Social History:   Labs: LDL 126, Trigs 86, HDL 61, TC 202 ( 10/28/19 after d/c rosuvastatin 20mg )  Past Medical History:  Diagnosis Date   Albinism (HCC)    Anxiety disorder, unspecified    Blind    legally blind   Carotid artery occlusion    Cerebrovascular disease, unspecified    Coronary artery disease    DDD (degenerative disc disease), lumbar    Diverticulitis large intestine w/o perforation or abscess w/o bleeding    Exercise-induced angina (HCC)    Family history of anesthesia complication    cousin with nausea and vomiting post anesthesia   First degree hemorrhoids    Hereditary and idiopathic neuropathy, unspecified    Hyperlipidemia    Hypertension    Osteopenia    PFO (patent foramen ovale)    Sciatica    Stable angina (HCC)    Stroke (HCC) 3/15    Current Outpatient Medications on File Prior to Visit  Medication Sig Dispense Refill   ALPRAZolam (XANAX) 0.25 MG tablet Take 0.25 mg by mouth 2 (two) times daily.     Ascorbic Acid (VITAMIN C PO) Take 1 tablet by mouth every morning.     Cholecalciferol (VITAMIN D PO) Take 1 tablet by mouth daily.      clopidogrel (PLAVIX) 75 MG tablet Take 1 tablet (75 mg total) by mouth daily with breakfast. 30 tablet 0   hydrochlorothiazide (HYDRODIURIL) 25 MG tablet Take 25 mg by mouth at bedtime.     hydroxypropyl methylcellulose (ISOPTO TEARS) 2.5 % ophthalmic solution Place 1 drop into both eyes 4 (four) times daily as needed for dry eyes.     isosorbide mononitrate (IMDUR) 30 MG 24 hr tablet Take 1 tablet (30 mg total) by  mouth daily. 90 tablet 3   metoprolol tartrate (LOPRESSOR) 25 MG tablet Take 0.5 tablets (12.5 mg total) by mouth 2 (two) times daily. 120 tablet 2   nitroGLYCERIN (NITROSTAT) 0.4 MG SL tablet Place 0.4 mg under the tongue every 5 (five) minutes as needed for chest pain.      No current facility-administered medications on file prior to visit.    No Known Allergies  Assessment/Plan:  1. Hyperlipidemia -

## 2021-03-01 ENCOUNTER — Telehealth: Payer: Self-pay | Admitting: Pharmacist

## 2021-03-01 ENCOUNTER — Other Ambulatory Visit: Payer: Self-pay

## 2021-03-01 ENCOUNTER — Ambulatory Visit: Payer: Medicare Other | Admitting: Pharmacist

## 2021-03-01 DIAGNOSIS — G459 Transient cerebral ischemic attack, unspecified: Secondary | ICD-10-CM

## 2021-03-01 DIAGNOSIS — I25119 Atherosclerotic heart disease of native coronary artery with unspecified angina pectoris: Secondary | ICD-10-CM

## 2021-03-01 DIAGNOSIS — E782 Mixed hyperlipidemia: Secondary | ICD-10-CM

## 2021-03-01 NOTE — Progress Notes (Signed)
Patient ID: Kerri Snyder                 DOB: July 28, 1940                    MRN: [id number]     HPI: Kerri Snyder is a 80 y.o. female patient referred to lipid clinic by Dr Anne Fu. PMH is significant for CAD, angina, HLD, HTN and history of stroke in 2015.  Patient presents today in good spirits.  Not currently on any lipid lowering therapy and lipid panel last updated in July 2021.  Is unable to tolerate rosuvastatin or atorvastatin. Both caused severe muscle pain in which she had trouble walking.  Will not attempt another statin because she says she needs to be on her feet all day at work.   Current Medications: n/a Intolerances: rosuvastatin 20mg , atorvastatin 20mg  Risk Factors: CAD, hx of CVA LDL goal: <70  Labs: TC 202, HDL 61, LDL 126, Trigs 86 (10/28/19)  Past Medical History:  Diagnosis Date   Albinism (HCC)    Anxiety disorder, unspecified    Blind    legally blind   Carotid artery occlusion    Cerebrovascular disease, unspecified    Coronary artery disease    DDD (degenerative disc disease), lumbar    Diverticulitis large intestine w/o perforation or abscess w/o bleeding    Exercise-induced angina (HCC)    Family history of anesthesia complication    cousin with nausea and vomiting post anesthesia   First degree hemorrhoids    Hereditary and idiopathic neuropathy, unspecified    Hyperlipidemia    Hypertension    Osteopenia    PFO (patent foramen ovale)    Sciatica    Stable angina (HCC)    Stroke (HCC) 3/15    Current Outpatient Medications on File Prior to Visit  Medication Sig Dispense Refill   ALPRAZolam (XANAX) 0.25 MG tablet Take 0.25 mg by mouth 2 (two) times daily.     Ascorbic Acid (VITAMIN C PO) Take 1 tablet by mouth every morning.     aspirin (ASPIRIN 81) 81 MG chewable tablet 1 tablet     Cholecalciferol (VITAMIN D PO) Take 1 tablet by mouth daily.      clopidogrel (PLAVIX) 75 MG tablet Take 1 tablet (75 mg total) by mouth daily with breakfast.  30 tablet 0   hydrochlorothiazide (HYDRODIURIL) 25 MG tablet Take 25 mg by mouth at bedtime.     hydroxypropyl methylcellulose (ISOPTO TEARS) 2.5 % ophthalmic solution Place 1 drop into both eyes 4 (four) times daily as needed for dry eyes.     isosorbide mononitrate (IMDUR) 30 MG 24 hr tablet Take 1 tablet (30 mg total) by mouth daily. 90 tablet 3   ketoconazole (NIZORAL) 2 % cream 1 application to affected area     metoprolol tartrate (LOPRESSOR) 25 MG tablet Take 0.5 tablets (12.5 mg total) by mouth 2 (two) times daily. 120 tablet 2   nitroGLYCERIN (NITROSTAT) 0.4 MG SL tablet Place 0.4 mg under the tongue every 5 (five) minutes as needed for chest pain.      vitamin B-12 (CYANOCOBALAMIN) 1000 MCG tablet 1 tablet     Vitamin D, Ergocalciferol, (DRISDOL) 1.25 MG (50000 UNIT) CAPS capsule 1 capsule     No current facility-administered medications on file prior to visit.    No Known Allergies  Assessment/Plan:  1. Hyperlipidemia - Patient LDL 126  which is above goal of <70 although lipid panel has not  been drawn for over a year.  Since patient is intolerant of statins, recommended PCSK9i.    Using demo pen, educated patient on mechanism of action, storage, site selection, administration, and possible adverse effects.  Patient was able to demonstrate in room.  Will complete PA and apply for grant.  Repeat lipid panel in 2-3 months.  Laural Golden, PharmD, BCACP, CDCES, CPP [address] [place], [place], [address] Phone: [phone], Fax: [phone]

## 2021-03-01 NOTE — Patient Instructions (Addendum)
It was nice meeting you today  We would like your LDL (bad cholesterol) to be less than 70  Since you are intolerant to statins, we will start a new medication you will inject once every 2 weeks  I will complete the prior authorization for you and contact you when it is approved  Once you start the medication we will recheck your cholesterol in 2-3 months  Please call with any questions!  Laural Golden, PharmD, BCACP, CDCES, CPP [address], [address] [place], [place], [address] Phone: [phone], Fax: [phone]

## 2021-03-01 NOTE — Telephone Encounter (Signed)
Please complete prior authorization for:  Name of medication, dose, and frequency Repatha 140mg  sq q 14 days or Praluent 75mg  sq q 14 days  Lab Orders Requested? yes  Which labs? Lipid panel  Estimated date for labs to be scheduled 2-3 months  Does patient need activated copay card? No, but will likely need to sign up for [facility].  Requests call after 4pm if approved

## 2021-03-05 MED ORDER — REPATHA SURECLICK 140 MG/ML ~~LOC~~ SOAJ
140.0000 mg | SUBCUTANEOUS | 11 refills | Status: DC
Start: 2021-03-05 — End: 2022-02-14

## 2021-03-05 NOTE — Addendum Note (Signed)
Addended by: Eather Colas on: 03/05/2021 04:54 PM   Modules accepted: Orders

## 2021-03-05 NOTE — Telephone Encounter (Signed)
Lvm for pt to start repatha 140 mg q14d and to complete fasting lipid panel post 4th dose. (Ordered and released). Informed pt that she may come to [facility] or [facility] and no appt needed. If she chooses to do lab work at [facility] the lipid panel needs to be ordered through clinic collect under dr. Delma Officer rph and scheduled as well.

## 2021-03-21 ENCOUNTER — Telehealth: Payer: Self-pay | Admitting: Cardiology

## 2021-03-21 NOTE — Telephone Encounter (Signed)
  Are you calling in reference to your FMLA or disability form? no   What is your question in regards to FMLA or disability form? no   Do you need copies of your medical records? yes   Are you waiting on a nurse to call you back with results or are you wanting copies of your results? Requesting copy of biopsy      Please route to Medical Records or your medical records site representative

## 2021-03-22 NOTE — Telephone Encounter (Signed)
Calling to check on status of biopsy report.  She states it was done 01/05/21.

## 2021-04-02 ENCOUNTER — Ambulatory Visit: Payer: Medicare Other | Admitting: Cardiology

## 2021-06-15 LAB — LIPID PANEL
Chol/HDL Ratio: 2.5 ratio (ref 0.0–4.4)
Cholesterol, Total: 164 mg/dL (ref 100–199)
HDL: 66 mg/dL (ref >39)
LDL Chol Calc (NIH): 82 mg/dL (ref 0–99)
Triglycerides: 86 mg/dL (ref 0–149)
VLDL Cholesterol Cal: 16 mg/dL (ref 5–40)

## 2021-06-20 ENCOUNTER — Telehealth: Payer: Self-pay | Admitting: Cardiology

## 2021-06-20 NOTE — Telephone Encounter (Signed)
Patient called for her lab results. Can leave message on VM.  ?

## 2021-06-20 NOTE — Telephone Encounter (Signed)
Called patient and lmom.  LDL improved but not quite to goal. Advised to continue Repatha every 2 weeks ?

## 2021-07-18 ENCOUNTER — Encounter: Payer: Self-pay | Admitting: Cardiology

## 2021-07-18 ENCOUNTER — Encounter: Payer: Self-pay | Admitting: *Deleted

## 2021-07-18 ENCOUNTER — Ambulatory Visit: Payer: Medicare Other | Admitting: Cardiology

## 2021-07-18 DIAGNOSIS — I25119 Atherosclerotic heart disease of native coronary artery with unspecified angina pectoris: Secondary | ICD-10-CM | POA: Diagnosis not present

## 2021-07-18 DIAGNOSIS — E782 Mixed hyperlipidemia: Secondary | ICD-10-CM

## 2021-07-18 MED ORDER — ISOSORBIDE MONONITRATE ER 60 MG PO TB24: 60.0000 mg | ORAL_TABLET | Freq: Every day | ORAL | 3 refills | Status: DC

## 2021-07-18 NOTE — Patient Instructions (Signed)
Medication Instructions:  °Please increase your Isosorbide to 60 mg a day. °Continue all other medications as listed. ° °*If you need a refill on your cardiac medications before your next appointment, please call your pharmacy* ° °Follow-Up: °At [facility], you and your health needs are our priority.  As part of our continuing mission to provide you with exceptional heart care, we have created designated Provider Care Teams.  These Care Teams include your primary Cardiologist (physician) and Advanced Practice Providers (APPs -  Physician Assistants and Nurse Practitioners) who all work together to provide you with the care you need, when you need it. ° °We recommend signing up for the patient portal called "MyChart".  Sign up information is provided on this After Visit Summary.  MyChart is used to connect with patients for Virtual Visits (Telemedicine).  Patients are able to view lab/test results, encounter notes, upcoming appointments, etc.  Non-urgent messages can be sent to your provider as well.   °To learn more about what you can do with MyChart, go to [email or web address].   ° °Your next appointment:   °6 month(s) ° °The format for your next appointment:   °In Person ° °Provider:   °Mark Skains, MD   ° ° °Thank you for choosing [facility]!! ° ° ° °

## 2021-07-18 NOTE — Progress Notes (Signed)
?Cardiology Office Note:   ? ?Date:  07/19/2021  ? ?ID:  Kerri Snyder, DOB 1941-04-07, MRN [id number] ? ?PCP:  Merri BrunetteSmith, Candace, MD ?  ?[facility] Providers ?Cardiologist:  Donato SchultzMark Sofya Moustafa, MD    ? ?Referring MD: Merri BrunetteSmith, Candace, MD  ? ? ?History of Present Illness:   ? ?Kerri CaffeyDoris J Stroope is a 81 y.o. female here for the follow-up of coronary artery disease. ? ?Overall doing well.  No chest discomfort.  No significant shortness of breath.  Still quite active. ? ? LAD intervention with left-to-right collaterals of the RCA on 09/09/2019. ? ?At last visit she had stated that she stopped taking her statin.  She was having some leg cramping when walking. ? ?She had a 99% RCA lesion that was intervened upon. ? ?She works for the blind. ? ?Had bilateral carotid endarterectomies in 2015.  This occurred after stroke with acute left basal ganglia infarct. ? ?Past Medical History:  ?Diagnosis Date  ? Albinism (HCC)   ? Anxiety disorder, unspecified   ? Blind   ? legally blind  ? Carotid artery occlusion   ? Cerebrovascular disease, unspecified   ? Coronary artery disease   ? DDD (degenerative disc disease), lumbar   ? Diverticulitis large intestine w/o perforation or abscess w/o bleeding   ? Exercise-induced angina (HCC)   ? Family history of anesthesia complication   ? cousin with nausea and vomiting post anesthesia  ? First degree hemorrhoids   ? Hereditary and idiopathic neuropathy, unspecified   ? Hyperlipidemia   ? Hypertension   ? Osteopenia   ? PFO (patent foramen ovale)   ? Sciatica   ? Stable angina (HCC)   ? Stroke [facility](HCC) 3/15  ? ? ?Past Surgical History:  ?Procedure Laterality Date  ? CAROTID ENDARTERECTOMY Left June 25, 2013  ? cea  ? CAROTID ENDARTERECTOMY Right August 06, 2013  ? cea  ? CESAREAN SECTION    ? CORONARY ATHERECTOMY N/A 09/09/2019  ? Procedure: CORONARY ATHERECTOMY;  Surgeon: Corky CraftsVaranasi, Jayadeep S, MD;  [facility];  Service: Cardiovascular;  Laterality: N/A;  ? CORONARY STENT INTERVENTION N/A  08/10/2019  ? Procedure: CORONARY STENT INTERVENTION;  Surgeon: Corky CraftsVaranasi, Jayadeep S, MD;  [facility];  Service: Cardiovascular;  Laterality: N/A;  ? CORONARY STENT INTERVENTION  09/09/2019  ? CORONARY STENT INTERVENTION N/A 09/09/2019  ? Procedure: CORONARY STENT INTERVENTION;  Surgeon: Corky CraftsVaranasi, Jayadeep S, MD;  [facility];  Service: Cardiovascular;  Laterality: N/A;  ? Ear drum replacement 2000 Right 2000  ? ENDARTERECTOMY Left 06/25/2013  ? Procedure: ENDARTERECTOMY CAROTID;  Surgeon: Larina Earthlyodd F Early, MD;  [facility];  Service: Vascular;  Laterality: Left;  ? ENDARTERECTOMY Right 08/06/2013  ? Procedure:  Carotid Endarterectomy with hemashield patch angioplasty;  Surgeon: Larina Earthlyodd F Early, MD;  [facility];  Service: Vascular;  Laterality: Right;  ? INTRAVASCULAR ULTRASOUND/IVUS N/A 08/10/2019  ? Procedure: Intravascular Ultrasound/IVUS;  Surgeon: Corky CraftsVaranasi, Jayadeep S, MD;  [facility];  Service: Cardiovascular;  Laterality: N/A;  ? INTRAVASCULAR ULTRASOUND/IVUS N/A 09/09/2019  ? Procedure: Intravascular Ultrasound/IVUS;  Surgeon: Corky CraftsVaranasi, Jayadeep S, MD;  [facility];  Service: Cardiovascular;  Laterality: N/A;  ? LEFT HEART CATH AND CORONARY ANGIOGRAPHY N/A 08/10/2019  ? Procedure: LEFT HEART CATH AND CORONARY ANGIOGRAPHY;  Surgeon: Corky CraftsVaranasi, Jayadeep S, MD;  [facility];  Service: Cardiovascular;  Laterality: N/A;  ? LEFT HEART CATH AND CORONARY ANGIOGRAPHY N/A 09/09/2019  ?  Procedure: LEFT HEART CATH AND CORONARY ANGIOGRAPHY;  Surgeon: Corky Crafts, MD;  [facility] CV [facility];  Service: Cardiovascular;  Laterality: N/A;  ? TEE WITHOUT CARDIOVERSION N/A 06/17/2013  ? Procedure: TRANSESOPHAGEAL ECHOCARDIOGRAM (TEE);  Surgeon: Vesta Mixer, MD;  Location[facility] ENDOSCOPY;  Service: Cardiovascular;  Laterality: N/A;  ? TONSILLECTOMY    ? TUBAL LIGATION    ? ? ?Current Medications: ?Current Meds  ?Medication Sig  ?  isosorbide mononitrate (IMDUR) 60 MG 24 hr tablet Take 1 tablet (60 mg total) by mouth daily.  ?  ? ?Allergies:   Patient has no known allergies.  ? ?Social History  ? ?Socioeconomic History  ? Marital status: Married  ?  Spouse name: Not on file  ? Number of children: Not on file  ? Years of education: Not on file  ? Highest education level: Not on file  ?Occupational History  ? Not on file  ?Tobacco Use  ? Smoking status: Never  ? Smokeless tobacco: Never  ?Vaping Use  ? Vaping Use: Never used  ?Substance and Sexual Activity  ? Alcohol use: No  ?  Alcohol/week: 0.0 standard drinks  ? Drug use: No  ? Sexual activity: Never  ?Other Topics Concern  ? Not on file  ?Social History Narrative  ? Not on file  ? ?Social Determinants of Health  ? ?Financial Resource Strain: Not on file  ?Food Insecurity: Not on file  ?Transportation Needs: Not on file  ?Physical Activity: Not on file  ?Stress: Not on file  ?Social Connections: Not on file  ?  ? ?Family History: ?The patient's family history includes Alcohol abuse in her father; Alzheimer's disease in her mother; Diabetes in her father; Heart disease in her father. ? ?ROS:   ?Please see the history of present illness.    ? All other systems reviewed and are negative. ? ?EKGs/Labs/Other Studies Reviewed:   ? ?The following studies were reviewed today: ?Cardiac catheterization 09/09/2019: ?  ?Previously placed Mid LAD stent (unknown type) is widely patent. ?Prox RCA lesion is 95% stenosed. Orbital atherectomy was performed. ?A drug-eluting stent was successfully placed using a SYNERGY XD 3.50X24. Stent postdilated to 3.75 mm. ?Post intervention, there is a 0% residual stenosis. ?Mid RCA lesion is 99% stenosed. Orbital atherectomy was performed. ?A drug-eluting stent was successfully placed using a SYNERGY XD 3.0X38. Proximal stent was postdilated to 3.75 mm. ?Post intervention, there is a 0% residual stenosis. ?The stents overlap and were optimized with IVUS. ?LV end diastolic  pressure is mildly elevated. ?There is no aortic valve stenosis. ?RPAV lesion is 75% stenosed. Lesion in small, distal vessel. ?Diagnostic ?Dominance: Right ?Intervention ?  ? ? ? ?Recent Labs: ?No results found for requested labs within last 8760 hours.  ?Recent Lipid Panel ?   ?Component Value Date/Time  ? CHOL 164 06/15/2021 0824  ? TRIG 86 06/15/2021 0824  ? HDL 66 06/15/2021 0824  ? CHOLHDL 2.5 06/15/2021 0824  ? CHOLHDL 3.0 09/10/2019 0312  ? VLDL 20 09/10/2019 0312  ? LDLCALC 82 06/15/2021 0824  ? ?    ? ?   ? ?Physical Exam:   ? ?VS:  BP 118/66   Pulse 85   Ht 5\' 5"  (1.651 m)   Wt 129 lb 9.6 oz (58.8 kg)   SpO2 96%   BMI 21.57 kg/m?    ? ?Wt Readings from Last 3 Encounters:  ?07/18/21 129 lb 9.6 oz (58.8 kg)  ?02/22/21 130 lb (59 kg)  ?04/11/20  132 lb (59.9 kg)  ?  ? ?GEN:  Well nourished, well developed in no acute distress ?HEENT: Blind legally ?NECK: No JVD; No carotid bruits ?LYMPHATICS: No lymphadenopathy ?CARDIAC: RRR, no murmurs, no rubs, gallops ?RESPIRATORY:  Clear to auscultation without rales, wheezing or rhonchi  ?ABDOMEN: Soft, non-tender, non-distended ?MUSCULOSKELETAL:  No edema; No deformity  ?SKIN: Warm and dry ?NEUROLOGIC:  Alert and oriented x 3 ?PSYCHIATRIC:  Normal affect  ? ?ASSESSMENT:   ? ?1. Coronary artery disease involving native coronary artery of native heart with angina pectoris (HCC)   ?2. Mixed hyperlipidemia   ? ?PLAN:   ? ?In order of problems listed above: ? ?Atherosclerotic heart disease of native coronary artery with unstable angina pectoris (HCC) ?Overall doing well ? ?Coronary artery disease involving native coronary artery of native heart with angina pectoris (HCC) ?Continue with isosorbide, now at 60 mg.  We will increase.  We will send prescription.  Continues to have some chest discomfort anginal type symptoms.  At some point in the future we may desire to pursue stress test to evaluate for any significant ischemia.  We can always pursue cardiac catheterization  again as well but we are trying to avoid invasive procedures if possible.  Continue to manage and follow closely. ?Otherwise continue with other goal-directed care aspirin, Plavix, Repatha, metoprolol ? ?Mix

## 2021-07-19 NOTE — Assessment & Plan Note (Signed)
Overall doing well.

## 2021-07-19 NOTE — Assessment & Plan Note (Signed)
Thankfully using PCSK9 inhibitor Repatha.  LDL is currently closer to goal at 82. ?

## 2021-07-19 NOTE — Assessment & Plan Note (Signed)
Continue with isosorbide, now at 60 mg.  We will increase.  We will send prescription.  Continues to have some chest discomfort anginal type symptoms.  At some point in the future we may desire to pursue stress test to evaluate for any significant ischemia.  We can always pursue cardiac catheterization again as well but we are trying to avoid invasive procedures if possible.  Continue to manage and follow closely. ?Otherwise continue with other goal-directed care aspirin, Plavix, Repatha, metoprolol ?

## 2021-08-20 ENCOUNTER — Telehealth: Payer: Self-pay | Admitting: Cardiology

## 2021-08-20 NOTE — Telephone Encounter (Signed)
Charlynne Pander from [facility] Quality of Care Department, calling to follow up on a quality concern faxed to the office. She states they sent it before with a request for medical records, but received the original fax with the medical records. She states she did fax it again to [phone]. She states they need a response in writing, but if there are any questions to call: [phone] ? ?

## 2021-08-20 NOTE — Telephone Encounter (Signed)
Received form faxed from [facility] Quality of Care Department requesting a written response concerning pt's c/o feeling rushed/unheard by provider during office visit 07/18/2021.  Will give to Dr Anne Fu to review and reply when in the office next. ?

## 2021-08-21 NOTE — Telephone Encounter (Signed)
Dr Anne Fu is aware of this information.  He did attempt to call [facility] Quality of Care Dept yesterday afternoon.  LM.  He did call and speak with patient regarding her concerns.  Pt was very appreciative of the call and further discussion of her care concerns.  She had no further questions at the end of the call with Dr Anne Fu.   ?

## 2021-10-12 ENCOUNTER — Telehealth: Payer: Self-pay | Admitting: Cardiology

## 2021-10-12 NOTE — Telephone Encounter (Signed)
Pt c/o Shortness Of Breath: STAT if SOB developed within the last 24 hours or pt is noticeably SOB on the phone  1. Are you currently SOB (can you hear that pt is SOB on the phone)? No; this afternoon  2. How long have you been experiencing SOB? Past few months  3. Are you SOB when sitting or when up moving around? Moving around  4. Are you currently experiencing any other symptoms? Trouble with upper left arm, lightheadedness

## 2021-10-12 NOTE — Telephone Encounter (Signed)
Pt states "worsening sob" since seeing Dr. Anne Fu in April, but more so recently. SOB w/ activity like when walking/climbing hill (about 5 house length) then she experiences issue. She does have some "upper arm" discomfort when climbing hill, but not radiating. Does not occur at rest. Denies any weight gain or edema. She is still working running a machine 8 hours/day.  Pt aware Pam, RN will call her next week to schedule an appt w/ Sk[facility].  Aware it will be Wednesday or after. Aware I am not sure of the spots she can be worked into. Patient verbalized understanding and agreeable to plan.

## 2021-10-17 NOTE — Telephone Encounter (Signed)
Left message for pt to call back to discuss her concerns and scheduling her as an add on to see Dr Anne Fu 7/6 in the AM.

## 2021-10-17 NOTE — Telephone Encounter (Signed)
Spoke with pt who reports her SOB was not as bad today because she was off from work yesterday and feel well rested.  Being blind, she has to have someone to drive her and is not able to come in AM for eval with Dr Anne Fu.  Pt requested to be seen one day next week.  Added onto the [facility] schedule for Monday 7/10 at 3:20 pm.  Pt is aware to call back prior to then if s/s change or worsen.

## 2021-10-22 ENCOUNTER — Ambulatory Visit: Payer: Medicare Other | Admitting: Cardiology

## 2022-02-10 ENCOUNTER — Encounter (HOSPITAL_COMMUNITY): Payer: Self-pay

## 2022-02-10 ENCOUNTER — Ambulatory Visit (HOSPITAL_COMMUNITY)
Admission: EM | Admit: 2022-02-10 | Discharge: 2022-02-10 | Disposition: A | Discharge status: Home / Self Care | Payer: Medicare Other | Attending: Physician Assistant | Admitting: Physician Assistant

## 2022-02-10 DIAGNOSIS — J069 Acute upper respiratory infection, unspecified: Secondary | ICD-10-CM

## 2022-02-10 DIAGNOSIS — R062 Wheezing: Secondary | ICD-10-CM

## 2022-02-10 DIAGNOSIS — J40 Bronchitis, not specified as acute or chronic: Secondary | ICD-10-CM | POA: Diagnosis not present

## 2022-02-10 MED ORDER — DM-GUAIFENESIN ER 30-600 MG PO TB12: 1.0000 | ORAL_TABLET | Freq: Two times a day (BID) | ORAL | 0 refills | Status: DC

## 2022-02-10 MED ORDER — AEROCHAMBER PLUS FLO-VU SMALL MISC
1.0000 | Freq: Once | Status: AC
  Administered 2022-02-10: 1

## 2022-02-10 MED ORDER — AEROCHAMBER PLUS FLO-VU LARGE MISC
Status: AC
  Filled 2022-02-10: qty 1

## 2022-02-10 MED ORDER — ALBUTEROL SULFATE HFA 108 (90 BASE) MCG/ACT IN AERS
2.0000 | INHALATION_SPRAY | Freq: Once | RESPIRATORY_TRACT | Status: AC
  Administered 2022-02-10: 2 via RESPIRATORY_TRACT

## 2022-02-10 MED ORDER — ALBUTEROL SULFATE HFA 108 (90 BASE) MCG/ACT IN AERS
INHALATION_SPRAY | RESPIRATORY_TRACT | Status: AC
  Filled 2022-02-10: qty 6.7

## 2022-02-10 MED ORDER — AEROCHAMBER PLUS FLO-VU SMALL MISC
Status: AC
  Filled 2022-02-10: qty 1

## 2022-02-10 NOTE — ED Provider Notes (Signed)
[facility]    CSN: [id number] Arrival date & time: 02/10/22  1006      History   Chief Complaint Chief Complaint  Patient presents with   Cough   chest congestion     HPI Kerri Snyder is a 81 y.o. female.   81 year old female presents with cough and chest congestion.  Patient indicates for the past week she has been having cough, chest congestion with yellow production.  She also indicates that she is having some wheezing and shortness of breath with her cough.  She is also having some upper respiratory symptoms of rhinitis, and postnasal drip with yellow production.  Patient denies fever, chills, body aches, fatigue.  Patient indicates she is taking some Coricidin OTC which has helped decrease her symptoms.  She indicates she is tolerating fluids well however continues to have the chest congestion and cough.   Cough Associated symptoms: wheezing     Past Medical History:  Diagnosis Date   Albinism (HCC)    Anxiety disorder, unspecified    Blind    legally blind   Carotid artery occlusion    Cerebrovascular disease, unspecified    Coronary artery disease    DDD (degenerative disc disease), lumbar    Diverticulitis large intestine w/o perforation or abscess w/o bleeding    Exercise-induced angina    Family history of anesthesia complication    cousin with nausea and vomiting post anesthesia   First degree hemorrhoids    Hereditary and idiopathic neuropathy, unspecified    Hyperlipidemia    Hypertension    Osteopenia    PFO (patent foramen ovale)    Sciatica    Stable angina    Stroke (HCC) 3/15    Patient Active Problem List   Diagnosis Date Noted   Albinism (HCC) 02/27/2021   Anxiety disorder 02/27/2021   Atrial septal defect within oval fossa 02/27/2021   Diverticular disease of colon 02/27/2021   First degree hemorrhoids 02/27/2021   Hereditary and idiopathic neuropathy, unspecified 02/27/2021   Osteopenia 02/27/2021   Peripheral vascular  disease (HCC) 02/27/2021   Hypertension 02/27/2021   Closed fracture of distal phalanx of finger 09/05/2020   Coronary artery disease involving native coronary artery of native heart with angina pectoris (HCC) 09/09/2019   Nonspecific abnormal electrocardiogram (ECG) (EKG) 08/06/2019   Legally blind 08/06/2019   History of stroke 08/06/2019   Aftercare following surgery of the circulatory system, NEC 08/24/2013   Occlusion and stenosis of carotid artery without mention of cerebral infarction 07/13/2013   Carotid stenosis 06/25/2013   TIA (transient ischemic attack) 06/14/2013   Slurred speech 06/14/2013   Elevated blood pressure 06/14/2013   Mixed hyperlipidemia 06/14/2013   Supratherapeutic INR 06/14/2013   CVA (cerebral infarction) 06/14/2013    Past Surgical History:  Procedure Laterality Date   CAROTID ENDARTERECTOMY Left June 25, 2013   cea   CAROTID ENDARTERECTOMY Right August 06, 2013   cea   CESAREAN SECTION     CORONARY ATHERECTOMY N/A 09/09/2019   Procedure: CORONARY ATHERECTOMY;  Surgeon: Corky Crafts, MD;  Location: [facility] INVASIVE CV LAB;  Service: Cardiovascular;  Laterality: N/A;   CORONARY STENT INTERVENTION N/A 08/10/2019   Procedure: CORONARY STENT INTERVENTION;  Surgeon: Corky Crafts, MD;  Location: [facility] INVASIVE CV LAB;  Service: Cardiovascular;  Laterality: N/A;   CORONARY STENT INTERVENTION  09/09/2019   CORONARY STENT INTERVENTION N/A 09/09/2019   Procedure: CORONARY STENT INTERVENTION;  Surgeon: Corky Crafts, MD;  Location[facility] INVASIVE  CV LAB;  Service: Cardiovascular;  Laterality: N/A;   Ear drum replacement 2000 Right 2000   ENDARTERECTOMY Left 06/25/2013   Procedure: ENDARTERECTOMY CAROTID;  Surgeon: Rosetta Posner, MD;  Location[facility];  Service: Vascular;  Laterality: Left;   ENDARTERECTOMY Right 08/06/2013   Procedure:  Carotid Endarterectomy with hemashield patch angioplasty;  Surgeon: Rosetta Posner, MD;  Location: [place];  Service:  Vascular;  Laterality: Right;   INTRAVASCULAR ULTRASOUND/IVUS N/A 08/10/2019   Procedure: Intravascular Ultrasound/IVUS;  Surgeon: Jettie Booze, MD;  Location: [place] CV LAB;  Service: Cardiovascular;  Laterality: N/A;   INTRAVASCULAR ULTRASOUND/IVUS N/A 09/09/2019   Procedure: Intravascular Ultrasound/IVUS;  Surgeon: Jettie Booze, MD;  Location: [place] CV LAB;  Service: Cardiovascular;  Laterality: N/A;   LEFT HEART CATH AND CORONARY ANGIOGRAPHY N/A 08/10/2019   Procedure: LEFT HEART CATH AND CORONARY ANGIOGRAPHY;  Surgeon: Jettie Booze, MD;  Location: [place] CV LAB;  Service: Cardiovascular;  Laterality: N/A;   LEFT HEART CATH AND CORONARY ANGIOGRAPHY N/A 09/09/2019   Procedure: LEFT HEART CATH AND CORONARY ANGIOGRAPHY;  Surgeon: Jettie Booze, MD;  Location: [place] CV LAB;  Service: Cardiovascular;  Laterality: N/A;   TEE WITHOUT CARDIOVERSION N/A 06/17/2013   Procedure: TRANSESOPHAGEAL ECHOCARDIOGRAM (TEE);  Surgeon: Thayer Headings, MD;  Location: [place];  Service: Cardiovascular;  Laterality: N/A;   TONSILLECTOMY     TUBAL LIGATION      OB History   No obstetric history on file.      Home Medications    Prior to Admission medications   Medication Sig Start Date End Date Taking? Authorizing Provider  ALPRAZolam (XANAX) 0.25 MG tablet Take 0.25 mg by mouth 2 (two) times daily. 01/26/21  Yes [provider]  Ascorbic Acid (VITAMIN C PO) Take 1 tablet by mouth every morning.   Yes [provider]  aspirin (ASPIRIN 81) 81 MG chewable tablet 1 tablet   Yes [provider]  Cholecalciferol (VITAMIN D PO) Take 1 tablet by mouth daily.    Yes [provider]  clopidogrel (PLAVIX) 75 MG tablet Take 1 tablet (75 mg total) by mouth daily with breakfast. 06/17/13  Yes Regalado, Belkys A, MD  dextromethorphan-guaiFENesin (MUCINEX DM) 30-600 MG 12hr tablet Take 1 tablet by mouth 2 (two) times daily. 02/10/22   Yes Nyoka Lint, PA-C  Evolocumab (REPATHA SURECLICK) 932 MG/ML SOAJ Inject 140 mg into the skin every 14 (fourteen) days. 03/05/21  Yes Jerline Pain, MD  hydrochlorothiazide (HYDRODIURIL) 25 MG tablet Take 25 mg by mouth at bedtime.   Yes [provider]  hydroxypropyl methylcellulose (ISOPTO TEARS) 2.5 % ophthalmic solution Place 1 drop into both eyes 4 (four) times daily as needed for dry eyes.   Yes [provider]  isosorbide mononitrate (IMDUR) 60 MG 24 hr tablet Take 1 tablet (60 mg total) by mouth daily. 07/18/21  Yes Jerline Pain, MD  ketoconazole (NIZORAL) 2 % cream 1 application to affected area 12/27/09  Yes [provider]  nitroGLYCERIN (NITROSTAT) 0.4 MG SL tablet Place 0.4 mg under the tongue every 5 (five) minutes as needed for chest pain.  07/13/19  Yes [provider]  vitamin B-12 (CYANOCOBALAMIN) 1000 MCG tablet 1 tablet   Yes [provider]  Vitamin D, Ergocalciferol, (DRISDOL) 1.25 MG (50000 UNIT) CAPS capsule 1 capsule   Yes [provider]  metoprolol tartrate (LOPRESSOR) 25 MG tablet Take 0.5 tablets (12.5 mg total) by mouth 2 (two)  times daily. 08/11/19 02/22/21  Filbert Schilder, NP    Family History Family History  Problem Relation Age of Onset   Diabetes Father    Heart disease Father        Before age 56   Alcohol abuse Father    Alzheimer's disease Mother     Social History Social History   Tobacco Use   Smoking status: Never   Smokeless tobacco: Never  Vaping Use   Vaping Use: Never used  Substance Use Topics   Alcohol use: No    Alcohol/week: 0.0 standard drinks of alcohol   Drug use: No     Allergies   Patient has no known allergies.   Review of Systems Review of Systems  Respiratory:  Positive for cough and wheezing.      Physical Exam Triage Vital Signs ED Triage Vitals  Enc Vitals Group     BP 02/10/22 1111 138/68     Pulse Rate 02/10/22 1111 66     Resp 02/10/22 1111 16      Temp 02/10/22 1111 (!) 97.5 F (36.4 C)     Temp Source 02/10/22 1111 Oral     SpO2 02/10/22 1111 98 %     Weight --      Height --      Head Circumference --      Peak Flow --      Pain Score 02/10/22 1110 0     Pain Loc --      Pain Edu? --      Excl. in GC? --    No data found.  Updated Vital Signs BP 138/68 (BP Location: Right Arm)   Pulse 66   Temp (!) 97.5 F (36.4 C) (Oral)   Resp 16   SpO2 98%   Visual Acuity Right Eye Distance:   Left Eye Distance:   Bilateral Distance:    Right Eye Near:   Left Eye Near:    Bilateral Near:     Physical Exam Constitutional:      Appearance: Normal appearance.  HENT:     Right Ear: Tympanic membrane and ear canal normal.     Left Ear: Tympanic membrane and ear canal normal.     Mouth/Throat:     Mouth: Mucous membranes are moist.     Pharynx: Oropharynx is clear.  Cardiovascular:     Rate and Rhythm: Normal rate and regular rhythm.     Heart sounds: Normal heart sounds.  Pulmonary:     Effort: Pulmonary effort is normal.     Breath sounds: Normal air entry. Examination of the right-lower field reveals wheezing and rhonchi. Examination of the left-lower field reveals wheezing and rhonchi. Wheezing (mild) and rhonchi (mild) present. No rales.  Lymphadenopathy:     Cervical: No cervical adenopathy.  Neurological:     Mental Status: She is alert.      UC Treatments / Results  Labs (all labs ordered are listed, but only abnormal results are displayed) Labs Reviewed - No data to display  EKG   Radiology No results found.  Procedures Procedures (including critical care time)  Medications Ordered in UC Medications  albuterol (VENTOLIN HFA) 108 (90 Base) MCG/ACT inhaler 2 puff (2 puffs Inhalation Given 02/10/22 1134)  AeroChamber Plus Flo-Vu Small device MISC 1 each (1 each Other Given 02/10/22 1135)    Initial Impression / Assessment and Plan / UC Course  I have reviewed the triage vital signs and the  nursing notes.  Pertinent labs & imaging results that were available during my care of the patient were reviewed by me and considered in my medical decision making (see chart for details).    Plan: 1.  The respiratory infection with cough will be treated with the following: A.  Mucinex DM every 12 hours to help control cough and congestion. 2.  The bronchitis will be treated with the following: A.  Mucinex DM every 12 hours to help control cough and congestion. B.  Albuterol inhaler with spacer, 2 puffs every 6-8 hours to help control cough, wheezing and congestion. 3.  The wheezing will be treated with the following: A.  Albuterol inhaler with spacer, 2 puffs every 6 hours for the next couple days to help control wheezing and congestion. 4.  Patient advised to follow-up PCP or return to urgent care if symptoms fail to improve.  (If symptoms fail to improve or worsen consider chest x-ray) Final Clinical Impressions(s) / UC Diagnoses   Final diagnoses:  Viral URI with cough  Bronchitis  Wheezing     Discharge Instructions      Advised take the Mucinex DM every 12 hours to help with the cough and congestion. Advised to use albuterol inhaler with spacer, 2 puffs every 6 hours on a regular basis for the next couple days to help with the wheezing and cough. Vies to follow-up PCP or return to urgent care if symptoms fail to improve.    ED Prescriptions     Medication Sig Dispense Auth. Provider   dextromethorphan-guaiFENesin (MUCINEX DM) 30-600 MG 12hr tablet Take 1 tablet by mouth 2 (two) times daily. 20 tablet Ellsworth Lennox, PA-C      PDMP not reviewed this encounter.   Ellsworth Lennox, PA-C 02/10/22 1137

## 2022-02-10 NOTE — Discharge Instructions (Signed)
Advised take the Mucinex DM every 12 hours to help with the cough and congestion. Advised to use albuterol inhaler with spacer, 2 puffs every 6 hours on a regular basis for the next couple days to help with the wheezing and cough. Vies to follow-up PCP or return to urgent care if symptoms fail to improve.

## 2022-02-10 NOTE — ED Triage Notes (Addendum)
Patient here for head and chest congestion that started roughly 1 week ago.  SXS: Coughing,sneezing   Documented by ZB

## 2022-02-14 ENCOUNTER — Other Ambulatory Visit: Payer: Self-pay | Admitting: Cardiology

## 2022-02-14 DIAGNOSIS — E782 Mixed hyperlipidemia: Secondary | ICD-10-CM

## 2022-02-14 DIAGNOSIS — I25119 Atherosclerotic heart disease of native coronary artery with unspecified angina pectoris: Secondary | ICD-10-CM

## 2022-02-14 DIAGNOSIS — I739 Peripheral vascular disease, unspecified: Secondary | ICD-10-CM

## 2022-02-19 ENCOUNTER — Telehealth: Payer: Self-pay | Admitting: Pharmacist Clinician (PhC)/ Clinical Pharmacy Specialist

## 2022-02-19 NOTE — Telephone Encounter (Signed)
[employer] approved to 02/05/23  ID        [id number] BIN      [id number] PCN     [id number] GRP     [id number]

## 2022-02-20 ENCOUNTER — Other Ambulatory Visit: Payer: Self-pay | Admitting: Cardiology

## 2022-03-03 ENCOUNTER — Other Ambulatory Visit: Payer: Self-pay | Admitting: Cardiology

## 2022-03-04 ENCOUNTER — Other Ambulatory Visit: Payer: Self-pay

## 2022-03-04 ENCOUNTER — Telehealth: Payer: Self-pay | Admitting: Cardiology

## 2022-03-04 MED ORDER — ISOSORBIDE MONONITRATE ER 60 MG PO TB24: 60.0000 mg | ORAL_TABLET | Freq: Every day | ORAL | 1 refills | Status: DC

## 2022-03-04 NOTE — Telephone Encounter (Signed)
Rx sent to pharmacy   

## 2022-03-04 NOTE — Telephone Encounter (Signed)
*  STAT* If patient is at the pharmacy, call can be transferred to refill team.   1. Which medications need to be refilled? (please list name of each medication and dose if known) isosorbide mononitrate (IMDUR) 60 MG 24 hr tablet   2. Which pharmacy/location (including street and city if local pharmacy) is medication to be sent to?  [id number] - [place], [place] - [address]    3. Do they need a 30 day or 90 day supply? 90  Patient is completely out of this medication.

## 2022-05-06 ENCOUNTER — Other Ambulatory Visit (HOSPITAL_COMMUNITY): Payer: Self-pay

## 2022-07-04 ENCOUNTER — Telehealth: Payer: Self-pay | Admitting: Cardiology

## 2022-07-04 MED ORDER — NITROGLYCERIN 0.4 MG SL SUBL: 0.4000 mg | SUBLINGUAL_TABLET | SUBLINGUAL | 3 refills | Status: DC | PRN

## 2022-07-04 NOTE — Telephone Encounter (Signed)
Pt c/o of Chest Pain: STAT if CP now or developed within 24 hours  1. Are you having CP right now? No 2. Are you experiencing any other symptoms (ex. SOB, nausea, vomiting, sweating)? SOB and left arm hurts  3. How long have you been experiencing CP? Pt stated it's been happening for awhile now  4. Is your CP continuous or coming and going? Coming and going 5. Have you taken Nitroglycerin? No   ?

## 2022-07-04 NOTE — Telephone Encounter (Signed)
Spoke with pt who reports several days of intermittent CP with some radiation into her left arm lasting about 10 minutes.  She also reports intermittent SOB on exertion.  She denies current CP, SOB or dizziness.  She reports her nitro is expired.  She is taking other medications as prescribed.  She does not check her BP or HR at home as her BP monitor batteries are dead.   Appointment scheduled with Dr Angelena Form, DOD on Monday, 07/08/2022 at 230pm.  Refilled nitro for pt and reviewed instructions.  Encouraged pt to replace batteries in her BP monitor so she is able to check her BP and HR daily.  Reviewed ED precautions.  Pt verbalizes understanding and agrees with current plan.

## 2022-07-07 NOTE — Progress Notes (Unsigned)
No chief complaint on file.  History of Present Illness: 82 yo female with history of legal blindness, anxiety, carotid artery disease, CAD, HTN, HLD and prior stroke who is added onto my schedule today for unplanned follow up. She is followed in our office by Dr. Marlou Porch. She has had bilateral carotid endarterectomies. Cardiac cath in April 2021 with a severe mid LAD stenosis treated with a drug eluting stent and a severe proximal RCA stenosis treated with a drug eluting stent. She has been treated with Imdur and Repatha. She called our office last week describing chest pain and dyspnea. She tells me today ***  Primary Care Physician: Carol Ada, MD  [facility] Cardiology: Marlou Porch  Past Medical History:  Diagnosis Date   Albinism [facility])    Anxiety disorder, unspecified    Blind    legally blind   Carotid artery occlusion    Cerebrovascular disease, unspecified    Coronary artery disease    DDD (degenerative disc disease), lumbar    Diverticulitis large intestine w/o perforation or abscess w/o bleeding    Exercise-induced angina    Family history of anesthesia complication    cousin with nausea and vomiting post anesthesia   First degree hemorrhoids    Hereditary and idiopathic neuropathy, unspecified    Hyperlipidemia    Hypertension    Osteopenia    PFO (patent foramen ovale)    Sciatica    Stable angina    Stroke [facility]) 3/15    Past Surgical History:  Procedure Laterality Date   CAROTID ENDARTERECTOMY Left June 25, 2013   cea   CAROTID ENDARTERECTOMY Right August 06, 2013   cea   CESAREAN SECTION     CORONARY ATHERECTOMY N/A 09/09/2019   Procedure: CORONARY ATHERECTOMY;  Surgeon: Jettie Booze, MD;  Location: [place] CV LAB;  Service: Cardiovascular;  Laterality: N/A;   CORONARY STENT INTERVENTION N/A 08/10/2019   Procedure: CORONARY STENT INTERVENTION;  Surgeon: Jettie Booze, MD;  Location: [place] [facility];  Service: Cardiovascular;  Laterality: N/A;    CORONARY STENT INTERVENTION  09/09/2019   CORONARY STENT INTERVENTION N/A 09/09/2019   Procedure: CORONARY STENT INTERVENTION;  Surgeon: Jettie Booze, MD;  Location: [place] [facility];  Service: Cardiovascular;  Laterality: N/A;   Ear drum replacement 2000 Right 2000   ENDARTERECTOMY Left 06/25/2013   Procedure: ENDARTERECTOMY CAROTID;  Surgeon: Rosetta Posner, MD;  Location: [facility] OR;  Service: Vascular;  Laterality: Left;   ENDARTERECTOMY Right 08/06/2013   Procedure:  Carotid Endarterectomy with hemashield patch angioplasty;  Surgeon: Rosetta Posner, MD;  Location: [place];  Service: Vascular;  Laterality: Right;   INTRAVASCULAR ULTRASOUND/IVUS N/A 08/10/2019   Procedure: Intravascular Ultrasound/IVUS;  Surgeon: Jettie Booze, MD;  Location: [place] [facility];  Service: Cardiovascular;  Laterality: N/A;   INTRAVASCULAR ULTRASOUND/IVUS N/A 09/09/2019   Procedure: Intravascular Ultrasound/IVUS;  Surgeon: Jettie Booze, MD;  Location: [place] [facility];  Service: Cardiovascular;  Laterality: N/A;   LEFT HEART CATH AND CORONARY ANGIOGRAPHY N/A 08/10/2019   Procedure: LEFT HEART CATH AND CORONARY ANGIOGRAPHY;  Surgeon: Jettie Booze, MD;  Location: [place] [facility];  Service: Cardiovascular;  Laterality: N/A;   LEFT HEART CATH AND CORONARY ANGIOGRAPHY N/A 09/09/2019   Procedure: LEFT HEART CATH AND CORONARY ANGIOGRAPHY;  Surgeon: Jettie Booze, MD;  Location: [place] [facility];  Service: Cardiovascular;  Laterality: N/A;   TEE WITHOUT CARDIOVERSION N/A 06/17/2013   Procedure: TRANSESOPHAGEAL ECHOCARDIOGRAM (TEE);  Surgeon: Thayer Headings, MD;  Location: [facility] ENDOSCOPY;  Service: Cardiovascular;  Laterality: N/A;   TONSILLECTOMY     TUBAL LIGATION      Current Outpatient Medications  Medication Sig Dispense Refill   ALPRAZolam (XANAX) 0.25 MG tablet Take 0.25 mg by mouth 2 (two) times daily.     Ascorbic Acid (VITAMIN C PO) Take 1 tablet by mouth every morning.      aspirin (ASPIRIN 81) 81 MG chewable tablet 1 tablet     Cholecalciferol (VITAMIN D PO) Take 1 tablet by mouth daily.      clopidogrel (PLAVIX) 75 MG tablet Take 1 tablet (75 mg total) by mouth daily with breakfast. 30 tablet 0   dextromethorphan-guaiFENesin (MUCINEX DM) 30-600 MG 12hr tablet Take 1 tablet by mouth 2 (two) times daily. 20 tablet 0   Evolocumab (REPATHA SURECLICK) [id number] MG/ML SOAJ INJECT 140 MG INTO THE SKIN EVERY 14 (FOURTEEN) DAYS. 6 mL 3   hydrochlorothiazide (HYDRODIURIL) 25 MG tablet Take 25 mg by mouth at bedtime.     hydroxypropyl methylcellulose (ISOPTO TEARS) 2.5 % ophthalmic solution Place 1 drop into both eyes 4 (four) times daily as needed for dry eyes.     isosorbide mononitrate (IMDUR) 60 MG 24 hr tablet Take 1 tablet (60 mg total) by mouth daily. 90 tablet 1   ketoconazole (NIZORAL) 2 % cream 1 application to affected area     metoprolol tartrate (LOPRESSOR) 25 MG tablet Take 0.5 tablets (12.5 mg total) by mouth 2 (two) times daily. 120 tablet 2   nitroGLYCERIN (NITROSTAT) 0.4 MG SL tablet Place 1 tablet (0.4 mg total) under the tongue every 5 (five) minutes as needed for chest pain. 25 tablet 3   vitamin B-12 (CYANOCOBALAMIN) 1000 MCG tablet 1 tablet     Vitamin D, Ergocalciferol, (DRISDOL) 1.25 MG (50000 UNIT) CAPS capsule 1 capsule     No current facility-administered medications for this visit.    No Known Allergies  Social History   Socioeconomic History   Marital status: Married    Spouse name: Not on file   Number of children: Not on file   Years of education: Not on file   Highest education level: Not on file  Occupational History   Not on file  Tobacco Use   Smoking status: Never   Smokeless tobacco: Never  Vaping Use   Vaping Use: Never used  Substance and Sexual Activity   Alcohol use: No    Alcohol/week: 0.0 standard drinks of alcohol   Drug use: No   Sexual activity: Never  Other Topics Concern   Not on file  Social History Narrative    Not on file   Social Determinants of Health   Financial Resource Strain: Not on file  Food Insecurity: Not on file  Transportation Needs: Not on file  Physical Activity: Not on file  Stress: Not on file  Social Connections: Not on file  Intimate Partner Violence: Not on file    Family History  Problem Relation Age of Onset   Diabetes Father    Heart disease Father        Before age 21   Alcohol abuse Father    Alzheimer's disease Mother     Review of Systems:  As stated in the HPI and otherwise negative.   There were no vitals taken for this visit.  Physical Examination: General: Well developed, well nourished, NAD  HEENT: OP clear, mucus membranes moist  SKIN: warm, dry. No rashes. Neuro:  No focal deficits  Musculoskeletal: Muscle strength 5/5 all ext  Psychiatric: Mood and affect normal  Neck: No JVD, no carotid bruits, no thyromegaly, no lymphadenopathy.  Lungs:Clear bilaterally, no wheezes, rhonci, crackles Cardiovascular: Regular rate and rhythm. No murmurs, gallops or rubs. Abdomen:Soft. Bowel sounds present. Non-tender.  Extremities: No lower extremity edema. Pulses are 2 + in the bilateral DP/PT.  EKG:  EKG {ACTION; IS/IS [id number] ordered today. The ekg ordered today demonstrates ***  Recent Labs: No results found for requested labs within last 365 days.   Lipid Panel    Component Value Date/Time   CHOL 164 06/15/2021 0824   TRIG 86 06/15/2021 0824   HDL 66 06/15/2021 0824   CHOLHDL 2.5 06/15/2021 0824   CHOLHDL 3.0 09/10/2019 0312   VLDL 20 09/10/2019 0312   LDLCALC 82 06/15/2021 0824     Wt Readings from Last 3 Encounters:  07/18/21 58.8 kg  02/22/21 59 kg  04/11/20 59.9 kg     Assessment and Plan:   1. CAD with unstable angina: Chest pain worrisome for unstable angina. Will plan cardiac cath at [facility] on ***.  I have reviewed the risks, indications, and alternatives to cardiac catheterization, possible angioplasty, and stenting with  the patient. Risks include but are not limited to bleeding, infection, vascular injury, stroke, myocardial infection, arrhythmia, kidney injury, radiation-related injury in the case of prolonged fluoroscopy use, emergency cardiac surgery, and death. The patient understands the risks of serious complication is 1-2 in [id number] with diagnostic cardiac cath and 1-2% or less with angioplasty/stenting.   Labs/ tests ordered today include:  No orders of the defined types were placed in this encounter.    Disposition:   F/U with me in ***    Signed, Lauree Chandler, MD, [facility] 07/07/2022 [facility] [place], [place], [place]  [address] Phone: [phone]; Fax: [phone]

## 2022-07-08 ENCOUNTER — Encounter: Payer: Self-pay | Admitting: Cardiovascular Disease

## 2022-07-08 ENCOUNTER — Encounter: Payer: Self-pay | Admitting: *Deleted

## 2022-07-08 ENCOUNTER — Ambulatory Visit: Payer: Medicare Other | Attending: Cardiovascular Disease | Admitting: Cardiovascular Disease

## 2022-07-08 VITALS — BP 130/74 | HR 76 | Ht 65.0 in | Wt 131.4 lb

## 2022-07-08 DIAGNOSIS — R011 Cardiac murmur, unspecified: Secondary | ICD-10-CM | POA: Diagnosis not present

## 2022-07-08 DIAGNOSIS — I25119 Atherosclerotic heart disease of native coronary artery with unspecified angina pectoris: Secondary | ICD-10-CM | POA: Diagnosis not present

## 2022-07-08 NOTE — Patient Instructions (Signed)
Medication Instructions:  No changes *If you need a refill on your cardiac medications before your next appointment, please call your pharmacy*   Lab Work: none   Testing/Procedures: Your physician has requested that you have an echocardiogram. Echocardiography is a painless test that uses sound waves to create images of your heart. It provides your doctor with information about the size and shape of your heart and how well your heart's chambers and valves are working. This procedure takes approximately one hour. There are no restrictions for this procedure. Please do NOT wear cologne, perfume, aftershave, or lotions (deodorant is allowed). Please arrive 15 minutes prior to your appointment time.  Your physician has requested that you have a lexiscan myoview. For further information please visit [email or web address]. Please follow instruction sheet, as given.   Follow-Up: At [facility], you and your health needs are our priority.  As part of our continuing mission to provide you with exceptional heart care, we have created designated Provider Care Teams.  These Care Teams include your primary Cardiologist (physician) and Advanced Practice Providers (APPs -  Physician Assistants and Nurse Practitioners) who all work together to provide you with the care you need, when you need it.  We recommend signing up for the patient portal called "MyChart".  Sign up information is provided on this After Visit Summary.  MyChart is used to connect with patients for Virtual Visits (Telemedicine).  Patients are able to view lab/test results, encounter notes, upcoming appointments, etc.  Non-urgent messages can be sent to your provider as well.   To learn more about what you can do with MyChart, go to [email or web address].    Your next appointment:   2-4  week(s)  Provider:   Candee Furbish, MD  or Advanced Practice Practitioner (PA-C, or NP)

## 2022-07-30 ENCOUNTER — Telehealth: Payer: Self-pay | Admitting: *Deleted

## 2022-07-30 NOTE — Telephone Encounter (Signed)
Left message on voicemail per DPR in reference to upcoming appointment scheduled on 08/07/22  with detailed instructions given per Myocardial Perfusion Study Information Sheet for the test. LM to arrive 15 minutes early, and that it is imperative to arrive on time for appointment to keep from having the test rescheduled. If you need to cancel or reschedule your appointment, please call the office within 24 hours of your appointment. Failure to do so may result in a cancellation of your appointment, and a $50 no show fee. Phone number given for call back for any questions. Twain Stenseth Jacqueline   

## 2022-08-07 ENCOUNTER — Ambulatory Visit (HOSPITAL_BASED_OUTPATIENT_CLINIC_OR_DEPARTMENT_OTHER): Payer: Medicare Other

## 2022-08-07 ENCOUNTER — Ambulatory Visit (HOSPITAL_COMMUNITY): Payer: Medicare Other | Attending: Cardiology

## 2022-08-07 DIAGNOSIS — R011 Cardiac murmur, unspecified: Secondary | ICD-10-CM | POA: Diagnosis present

## 2022-08-07 DIAGNOSIS — I25119 Atherosclerotic heart disease of native coronary artery with unspecified angina pectoris: Secondary | ICD-10-CM | POA: Insufficient documentation

## 2022-08-07 LAB — MYOCARDIAL PERFUSION IMAGING
LV dias vol: 107 mL (ref 46–106)
LV sys vol: 66 mL
Nuc Stress EF: 38 %
Peak HR: 103 {beats}/min
Rest HR: 66 {beats}/min
Rest Nuclear Isotope Dose: 10.7 mCi
SDS: 7
SRS: 5
SSS: 12
ST Depression (mm): 0 mm
Stress Nuclear Isotope Dose: 31.3 mCi
TID: 1.11

## 2022-08-07 LAB — ECHOCARDIOGRAM COMPLETE
Area-P 1/2: 5.54 cm2
Height: 65 in
MV M vel: 5.85 m/s
MV Peak grad: 136.9 mmHg
Weight: 2096 oz

## 2022-08-07 MED ORDER — TECHNETIUM TC 99M TETROFOSMIN IV KIT
31.3000 | PACK | Freq: Once | INTRAVENOUS | Status: AC | PRN
  Administered 2022-08-07: 31.3 via INTRAVENOUS

## 2022-08-07 MED ORDER — TECHNETIUM TC 99M TETROFOSMIN IV KIT
10.7000 | PACK | Freq: Once | INTRAVENOUS | Status: AC | PRN
  Administered 2022-08-07: 10.7 via INTRAVENOUS

## 2022-08-07 MED ORDER — PERFLUTREN LIPID MICROSPHERE
1.0000 mL | INTRAVENOUS | Status: AC | PRN
  Administered 2022-08-07: 1 mL via INTRAVENOUS

## 2022-08-07 MED ORDER — REGADENOSON 0.4 MG/5ML IV SOLN
0.4000 mg | Freq: Once | INTRAVENOUS | Status: AC
Start: 2022-08-07 — End: 2022-08-07
  Administered 2022-08-07: 0.4 mg via INTRAVENOUS

## 2022-08-12 DIAGNOSIS — I340 Nonrheumatic mitral (valve) insufficiency: Secondary | ICD-10-CM | POA: Insufficient documentation

## 2022-08-12 NOTE — Progress Notes (Unsigned)
Cardiology Office Note:    Date:  08/13/2022  ID:  Kerri Snyder, DOB [birth year], MRN [id number] PCP: Merri Brunette, MD  [facility] Providers Cardiologist:  Donato Schultz, MD       Patient Profile:      Coronary artery disease  S/p orbital atherectomy and 2.5 x 28 mm DES to mLAD in 07/2019 S/p orbital atherectomy and 3.5 x 24 mm DES to pRCA and 3 x 38 mm DES to mRCA in 08/2019 LHC 09/09/19: pLAD 25, mLAD stent patent; LCx irregs; RPAV 75 Chronic stable angina Myoview 08/07/22: inf and inf-lat infarct, no ischemia, EF 38; int risk (HFrEF) heart failure with reduced ejection fraction  TEE 06/17/13: EF 50-55 // LV gram 07/2019: EF 45-50 TTE 08/07/22: inf AK, EF 30-35, Gr 2 DD, NL RVSF, severe LAE, mod to severe MR, trivial AI, RAP 3 Mitral regurgitation TTE 07/2022: mod to severe MR Carotid artery disease S/p bilateral CEAs Carotid US (VVS) 09/16/14: patent bilat CEAs Hx of CVA Hypertension  Hyperlipidemia  Legal blindness Anxiety        History of Present Illness:   Kerri Snyder is a 82 y.o. female who returns for f/u of CAD. She was seen 07/08/22 as an add on by Dr. Clifton James (DOD visit) for progressive chest pain and shortness of breath. She also had a murmur on exam. Her Myoview is abnormal with inf, inf-lat infarct, EF 38, no ischemia (intermediate risk). Her echocardiogram shows that her EF is down to 30-35 and she has mod to severe MR. She is here alone.  Her symptoms have mainly included shortness of breath and throat tightness with exertion.  This typically occurs after climbing 1 flight of stairs.  Of note, she works on a Government social research officer for [employer].  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  She sometimes gets short of breath and left arm discomfort walking up hills.  She rarely feels left arm pain after getting up from her chair to go to her room.  She has felt the symptoms for the past several months  without any change.  She has not had syncope, orthopnea, leg edema.  Review of Systems  Constitutional: Negative for fever.  Respiratory:  Negative for cough.   Gastrointestinal:  Negative for diarrhea and vomiting.   see HPI    Studies Reviewed:    EKG:  not done  Risk Assessment/Calculations:             Physical Exam:   VS:  BP 108/60   Pulse 70   Ht 5\' 5"  (1.651 m)   Wt 133 lb 9.6 oz (60.6 kg)   SpO2 97%   BMI 22.23 kg/m    Wt Readings from Last 3 Encounters:  08/13/22 133 lb 9.6 oz (60.6 kg)  08/07/22 131 lb (59.4 kg)  07/08/22 131 lb 6.4 oz (59.6 kg)    Constitutional:      Appearance: Healthy appearance. Not in distress.  Neck:     Vascular: JVD normal.  Pulmonary:     Breath sounds: Normal breath sounds. No wheezing. No rales.  Cardiovascular:     Normal rate. Regular rhythm.     Murmurs: There is no murmur.  I could not appreciate her MR murmur today  Edema:    Peripheral edema absent.  Abdominal:     Palpations: Abdomen is soft.       ASSESSMENT  AND PLAN:   Coronary artery disease involving native coronary artery of native heart with angina pectoris (HCC) Hx of DES to LAD and DES x 2 to RCA in 2021. She has a hx of chronic angina. She has noted worsening chest pain and shortness of breath for the past several mos. A recent Myoview demonstrated inf scar but no ischemia. Her echocardiogram showed EF 30-35 and mod to severe MR. I have reviewed her case with Dr. Anne Fu. He has suggested adjusting GDMT initially to see if her EF and MR will improve on med Rx alone. As noted, her symptoms over the past few mos have remained stable. She describes CCS class II-II angina. Continue Plavix 75 mg once daily, Repatha 140 mg every 14 days, Imdur 60 mg once daily, NTG prn. I will change her Metoprolol tartrate to Toprol XL for CHF GDMT. F/u in 4 weeks. If symptoms progress or if her EF/MR does not improve, she will likely need cardiac catheterization.  HFrEF (heart failure  with reduced ejection fraction) (HCC) EF is now 30-35. Prob ischemic CM. NYHA II. Volume status seems stable. We discussed the "4 pillars" of (HFrEF) heart failure with reduced ejection fraction management to include beta-blocker, ACE/ARB/ARNI, MRA, SGLT2i. Her BP is soft and will likely limit GDMT. I do not think she will be able to tolerate Entresto at this point. I am also hesitant to try Coreg b/c of BP.  Stop Metoprolol tartrate Start Toprol XL 25 mg once daily Start Losartan 12.5 mg once daily  Continue HCTZ 25 mg once daily, Imdur 60 mg once daily  F/u 4 weeks Consider adding SGLT2 inhib at f/u BMET at next OV  Moderate to severe mitral regurgitation Volume status stable. She is not having symptoms of orthopnea, paroxysmal nocturnal dyspnea. Advance GDMT for (HFrEF) heart failure with reduced ejection fraction as noted. Plan f/u echocardiogram once she is on max GDMT to reassess EF and MR.       Dispo:  Return in about 4 weeks (around 09/10/2022) for Close Follow Up w/ Dr. Anne Fu, or Tereso Newcomer, PA-C.  Signed, Tereso Newcomer, PA-C

## 2022-08-13 ENCOUNTER — Ambulatory Visit: Payer: Medicare Other | Attending: Physician Assistant | Admitting: Physician Assistant

## 2022-08-13 ENCOUNTER — Encounter: Payer: Self-pay | Admitting: Physician Assistant

## 2022-08-13 VITALS — BP 108/60 | HR 70 | Ht 65.0 in | Wt 133.6 lb

## 2022-08-13 DIAGNOSIS — I25119 Atherosclerotic heart disease of native coronary artery with unspecified angina pectoris: Secondary | ICD-10-CM

## 2022-08-13 DIAGNOSIS — I340 Nonrheumatic mitral (valve) insufficiency: Secondary | ICD-10-CM

## 2022-08-13 DIAGNOSIS — I5020 Unspecified systolic (congestive) heart failure: Secondary | ICD-10-CM | POA: Diagnosis not present

## 2022-08-13 LAB — BASIC METABOLIC PANEL
BUN/Creatinine Ratio: 29 — ABNORMAL HIGH (ref 12–28)
BUN: 22 mg/dL (ref 8–27)
CO2: 24 mmol/L (ref 20–29)
Calcium: 9.7 mg/dL (ref 8.7–10.3)
Chloride: 99 mmol/L (ref 96–106)
Creatinine, Ser: 0.75 mg/dL (ref 0.57–1.00)
Glucose: 88 mg/dL (ref 70–99)
Potassium: 4.3 mmol/L (ref 3.5–5.2)
Sodium: 140 mmol/L (ref 134–144)
eGFR: 79 mL/min/{1.73_m2} (ref >59)

## 2022-08-13 MED ORDER — LOSARTAN POTASSIUM 25 MG PO TABS: 12.5000 mg | ORAL_TABLET | Freq: Every day | ORAL | 3 refills | Status: DC

## 2022-08-13 MED ORDER — METOPROLOL SUCCINATE ER 25 MG PO TB24: 25.0000 mg | ORAL_TABLET | Freq: Every day | ORAL | 3 refills | Status: DC

## 2022-08-13 NOTE — Assessment & Plan Note (Addendum)
EF is now 30-35. Prob ischemic CM. NYHA II. Volume status seems stable. We discussed the "4 pillars" of (HFrEF) heart failure with reduced ejection fraction management to include beta-blocker, ACE/ARB/ARNI, MRA, SGLT2i. Her BP is soft and will likely limit GDMT. I do not think she will be able to tolerate Entresto at this point. I am also hesitant to try Coreg b/c of BP.  Stop Metoprolol tartrate Start Toprol XL 25 mg once daily Start Losartan 12.5 mg once daily  Continue HCTZ 25 mg once daily, Imdur 60 mg once daily  F/u 4 weeks Consider adding SGLT2 inhib at f/u BMET at next OV

## 2022-08-13 NOTE — Patient Instructions (Addendum)
Medication Instructions:  Your physician has recommended you make the following change in your medication:   STOP Lopressor  START Losartan 25 mg taking 1/2 tablet daily = 12.5 mg  START Toprol XL 25 mg taking 1 daily  *If you need a refill on your cardiac medications before your next appointment, please call your pharmacy*   Lab Work: TODAY:  BMET  YOUR NEXT APPOINTMENT:  BMET  If you have labs (blood work) drawn today and your tests are completely normal, you will receive your results only by: MyChart Message (if you have MyChart) OR A paper copy in the mail If you have any lab test that is abnormal or we need to change your treatment, we will call you to review the results.   Testing/Procedures: None ordered   Follow-Up: At [facility], you and your health needs are our priority.  As part of our continuing mission to provide you with exceptional heart care, we have created designated Provider Care Teams.  These Care Teams include your primary Cardiologist (physician) and Advanced Practice Providers (APPs -  Physician Assistants and Nurse Practitioners) who all work together to provide you with the care you need, when you need it.  We recommend signing up for the patient portal called "MyChart".  Sign up information is provided on this After Visit Summary.  MyChart is used to connect with patients for Virtual Visits (Telemedicine).  Patients are able to view lab/test results, encounter notes, upcoming appointments, etc.  Non-urgent messages can be sent to your provider as well.   To learn more about what you can do with MyChart, go to [email or web address].    Your next appointment:   3 week(s)  Provider:   Donato Schultz, MD     Other Instructions   You have congestive heart failure with reduced ejection fraction. Your ejection fraction should be 60%. Your ejection fraction is currently 30-35%. You also have mitral regurgitation. This valve leaks significantly. We  will try to adjust medications to improve your symptoms and ejection fraction. If at any point you feel that your symptoms of shortness of breath or neck tightness is getting worse, we should see you sooner.

## 2022-08-13 NOTE — Assessment & Plan Note (Addendum)
Hx of DES to LAD and DES x 2 to RCA in 2021. She has a hx of chronic angina. She has noted worsening chest pain and shortness of breath for the past several mos. A recent Myoview demonstrated inf scar but no ischemia. Her echocardiogram showed EF 30-35 and mod to severe MR. I have reviewed her case with Dr. Anne Fu. He has suggested adjusting GDMT initially to see if her EF and MR will improve on med Rx alone. As noted, her symptoms over the past few mos have remained stable. She describes CCS class II-II angina. Continue Plavix 75 mg once daily, Repatha 140 mg every 14 days, Imdur 60 mg once daily, NTG prn. I will change her Metoprolol tartrate to Toprol XL for CHF GDMT. F/u in 4 weeks. If symptoms progress or if her EF/MR does not improve, she will likely need cardiac catheterization.

## 2022-08-13 NOTE — Assessment & Plan Note (Signed)
Volume status stable. She is not having symptoms of orthopnea, paroxysmal nocturnal dyspnea. Advance GDMT for (HFrEF) heart failure with reduced ejection fraction as noted. Plan f/u echocardiogram once she is on max GDMT to reassess EF and MR.

## 2022-08-21 NOTE — Progress Notes (Signed)
Pt has been made aware of normal result and verbalized understanding.  jw

## 2022-09-05 ENCOUNTER — Encounter: Payer: Self-pay | Admitting: Cardiology

## 2022-09-05 ENCOUNTER — Ambulatory Visit: Payer: Medicare Other | Attending: Cardiology | Admitting: Cardiology

## 2022-09-05 ENCOUNTER — Ambulatory Visit: Payer: Medicare Other

## 2022-09-05 VITALS — BP 116/74 | HR 88 | Ht 65.0 in | Wt 133.2 lb

## 2022-09-05 DIAGNOSIS — I25119 Atherosclerotic heart disease of native coronary artery with unspecified angina pectoris: Secondary | ICD-10-CM

## 2022-09-05 DIAGNOSIS — I5020 Unspecified systolic (congestive) heart failure: Secondary | ICD-10-CM | POA: Diagnosis not present

## 2022-09-05 DIAGNOSIS — E782 Mixed hyperlipidemia: Secondary | ICD-10-CM

## 2022-09-05 NOTE — Patient Instructions (Signed)
Medication Instructions:  No changes *If you need a refill on your cardiac medications before your next appointment, please call your pharmacy*   Lab Work: Today: lipids/bmet  If you have labs (blood work) drawn today and your tests are completely normal, you will receive your results only by: MyChart Message (if you have MyChart) OR A paper copy in the mail If you have any lab test that is abnormal or we need to change your treatment, we will call you to review the results.   Testing/Procedures: none   Follow-Up: At [facility], you and your health needs are our priority.  As part of our continuing mission to provide you with exceptional heart care, we have created designated Provider Care Teams.  These Care Teams include your primary Cardiologist (physician) and Advanced Practice Providers (APPs -  Physician Assistants and Nurse Practitioners) who all work together to provide you with the care you need, when you need it.   Your next appointment:   3 month(s)  Provider:   Tereso Newcomer, PA-C

## 2022-09-05 NOTE — Progress Notes (Signed)
Cardiology Office Note:    Date:  09/05/2022   ID:  Kerri, Snyder 07-03-1940, MRN [id number]  PCP:  Merri Brunette, MD   [facility] Providers Cardiologist:  Donato Schultz, MD     Referring MD: Merri Brunette, MD    History of Present Illness:    Kerri Snyder is a 82 y.o. female follow-up coronary artery disease.  Previously had shortness of breath chest pain moderate to severe mitral regurgitation with EF of 30 to 35% on echocardiogram.  Medications were changed at recent visit on 08/13/2022 with Tereso Newcomer.  Toprol-XL was added as well as losartan.  She has done exceptionally well with these medication changes.  She feels much better.  She is able to complete her activities at work which include lifting 10 pound boxes and pushing pallets.   Past Medical History:  Diagnosis Date   Albinism (HCC)    Anxiety disorder, unspecified    Blind    legally blind   Carotid artery occlusion    Cerebrovascular disease, unspecified    Coronary artery disease    DDD (degenerative disc disease), lumbar    Diverticulitis large intestine w/o perforation or abscess w/o bleeding    Exercise-induced angina    Family history of anesthesia complication    cousin with nausea and vomiting post anesthesia   First degree hemorrhoids    Hereditary and idiopathic neuropathy, unspecified    Hyperlipidemia    Hypertension    Osteopenia    PFO (patent foramen ovale)    Sciatica    Stable angina    Stroke (HCC) 3/15    Past Surgical History:  Procedure Laterality Date   CAROTID ENDARTERECTOMY Left June 25, 2013   cea   CAROTID ENDARTERECTOMY Right August 06, 2013   cea   CESAREAN SECTION     CORONARY ATHERECTOMY N/A 09/09/2019   Procedure: CORONARY ATHERECTOMY;  Surgeon: Corky Crafts, MD;  Location: [facility] INVASIVE CV LAB;  Service: Cardiovascular;  Laterality: N/A;   CORONARY STENT INTERVENTION N/A 08/10/2019   Procedure: CORONARY STENT INTERVENTION;  Surgeon: Corky Crafts, MD;  Location: [facility] INVASIVE CV LAB;  Service: Cardiovascular;  Laterality: N/A;   CORONARY STENT INTERVENTION  09/09/2019   CORONARY STENT INTERVENTION N/A 09/09/2019   Procedure: CORONARY STENT INTERVENTION;  Surgeon: Corky Crafts, MD;  Location: [facility] INVASIVE CV LAB;  Service: Cardiovascular;  Laterality: N/A;   CORONARY ULTRASOUND/IVUS N/A 08/10/2019   Procedure: Intravascular Ultrasound/IVUS;  Surgeon: Corky Crafts, MD;  Location: [facility] INVASIVE CV LAB;  Service: Cardiovascular;  Laterality: N/A;   CORONARY ULTRASOUND/IVUS N/A 09/09/2019   Procedure: Intravascular Ultrasound/IVUS;  Surgeon: Corky Crafts, MD;  Location: [facility] INVASIVE CV LAB;  Service: Cardiovascular;  Laterality: N/A;   Ear drum replacement 2000 Right 2000   ENDARTERECTOMY Left 06/25/2013   Procedure: ENDARTERECTOMY CAROTID;  Surgeon: Larina Earthly, MD;  Location: [facility] OR;  Service: Vascular;  Laterality: Left;   ENDARTERECTOMY Right 08/06/2013   Procedure:  Carotid Endarterectomy with hemashield patch angioplasty;  Surgeon: Larina Earthly, MD;  Location: [facility] OR;  Service: Vascular;  Laterality: Right;   LEFT HEART CATH AND CORONARY ANGIOGRAPHY N/A 08/10/2019   Procedure: LEFT HEART CATH AND CORONARY ANGIOGRAPHY;  Surgeon: Corky Crafts, MD;  Location: [facility] INVASIVE CV LAB;  Service: Cardiovascular;  Laterality: N/A;   LEFT HEART CATH AND CORONARY ANGIOGRAPHY N/A 09/09/2019   Procedure: LEFT HEART CATH AND CORONARY ANGIOGRAPHY;  Surgeon: Corky Crafts, MD;  Location: [facility] INVASIVE CV LAB;  Service: Cardiovascular;  Laterality: N/A;   TEE WITHOUT CARDIOVERSION N/A 06/17/2013   Procedure: TRANSESOPHAGEAL ECHOCARDIOGRAM (TEE);  Surgeon: Vesta Mixer, MD;  Location: [facility] ENDOSCOPY;  Service: Cardiovascular;  Laterality: N/A;   TONSILLECTOMY     TUBAL LIGATION      Current Medications: Current Meds  Medication Sig   ALPRAZolam (XANAX) 0.25 MG tablet Take 0.25 mg by mouth 2 (two) times daily.    Ascorbic Acid (VITAMIN C PO) Take 1 tablet by mouth every morning.   Cholecalciferol (VITAMIN D PO) Take 1 tablet by mouth daily.    clopidogrel (PLAVIX) 75 MG tablet Take 1 tablet (75 mg total) by mouth daily with breakfast.   Evolocumab (REPATHA SURECLICK) 140 MG/ML SOAJ INJECT 140 MG INTO THE SKIN EVERY 14 (FOURTEEN) DAYS.   hydrochlorothiazide (HYDRODIURIL) 25 MG tablet Take 25 mg by mouth at bedtime.   hydroxypropyl methylcellulose (ISOPTO TEARS) 2.5 % ophthalmic solution Place 1 drop into both eyes 4 (four) times daily as needed for dry eyes.   isosorbide mononitrate (IMDUR) 60 MG 24 hr tablet Take 1 tablet (60 mg total) by mouth daily.   ketoconazole (NIZORAL) 2 % cream 1 application to affected area   losartan (COZAAR) 25 MG tablet Take 0.5 tablets (12.5 mg total) by mouth daily.   metoprolol succinate (TOPROL XL) 25 MG 24 hr tablet Take 1 tablet (25 mg total) by mouth daily.   nitroGLYCERIN (NITROSTAT) 0.4 MG SL tablet Place 1 tablet (0.4 mg total) under the tongue every 5 (five) minutes as needed for chest pain.   vitamin B-12 (CYANOCOBALAMIN) 1000 MCG tablet 1 tablet     Allergies:   Patient has no known allergies.   Social History   Socioeconomic History   Marital status: Married    Spouse name: Not on file   Number of children: Not on file   Years of education: Not on file   Highest education level: Not on file  Occupational History   Not on file  Tobacco Use   Smoking status: Never   Smokeless tobacco: Never  Vaping Use   Vaping Use: Never used  Substance and Sexual Activity   Alcohol use: No    Alcohol/week: 0.0 standard drinks of alcohol   Drug use: No   Sexual activity: Never  Other Topics Concern   Not on file  Social History Narrative   Not on file   Social Determinants of Health   Financial Resource Strain: Not on file  Food Insecurity: Not on file  Transportation Needs: Not on file  Physical Activity: Not on file  Stress: Not on file  Social  Connections: Not on file     Family History: The patient's family history includes Alcohol abuse in her father; Alzheimer's disease in her mother; Diabetes in her father; Heart disease in her father.  ROS:   Please see the history of present illness.     All other systems reviewed and are negative.  EKGs/Labs/Other Studies Reviewed:    The following studies were reviewed today: Cardiac Studies & Procedures   CARDIAC CATHETERIZATION  CARDIAC CATHETERIZATION 09/09/2019  Narrative  Previously placed Mid LAD stent (unknown type) is widely patent.  Prox RCA lesion is 95% stenosed. Orbital atherectomy was performed.  A drug-eluting stent was successfully placed using a SYNERGY XD 3.50X24. Stent postdilated to 3.75 mm.  Post intervention, there is a 0% residual stenosis.  Mid RCA lesion is 99% stenosed. Orbital atherectomy was performed.  A drug-eluting stent was successfully placed using a SYNERGY XD 3.0X38. Proximal stent was postdilated to 3.75 mm.  Post intervention, there is a 0% residual stenosis.  The stents overlap and were optimized with IVUS.  LV end diastolic pressure is mildly elevated.  There is no aortic valve stenosis.  RPAV lesion is 75% stenosed. Lesion in small, distal vessel.  Watch overnight given that she is visually impaired.  Continue aggressive secondary prevention.  Consider clopidogrel monotherapy after 6 months given diffuse disease and long stented areas.  Findings Coronary Findings Diagnostic  Dominance: Right  Left Anterior Descending Prox LAD to Mid LAD lesion is 25% stenosed. Previously placed Mid LAD stent (unknown type) is widely patent.  Left Circumflex The vessel exhibits minimal luminal irregularities.  Right Coronary Artery Prox RCA lesion is 95% stenosed. Mid RCA lesion is 99% stenosed. The lesion is type C. The lesion is severely calcified.  Right Posterior Descending Artery Collaterals RPDA filled by collaterals from  2nd Sept.  Right Posterior Atrioventricular Artery RPAV lesion is 75% stenosed.  Intervention  Prox RCA lesion Atherectomy CATH VISTA GUIDE 6FR XBRCA guide catheter was inserted. WIRE ASAHI PROWATER 180CM guidewire was used to cross lesion. Orbital atherectomy was performed using a CROWN DIAMONDBACK CLASSIC 1.25. Stent CATH VISTA GUIDE 6FR XBRCA guide catheter was inserted. Lesion crossed with guidewire using a WIRE ASAHI PROWATER 180CM. Pre-stent angioplasty was performed using a BALLOON SAPPHIRE 2.75X20. A drug-eluting stent was successfully placed using a SYNERGY XD 3.50X24. Stent strut is well apposed. Post-stent angioplasty was performed using a BALLOON SAPPHIRE [place] [id number]. Post-Intervention Lesion Assessment The intervention was successful. Pre-interventional TIMI flow is 3. Post-intervention TIMI flow is 3. No complications occurred at this lesion. There is a 0% residual stenosis post intervention.  Mid RCA lesion Atherectomy CATH VISTA GUIDE 6FR XBRCA guide catheter was inserted. WIRE VIPERWIRE COR FLEX TIP guidewire was used to cross lesion. Orbital atherectomy was performed using a CROWN DIAMONDBACK CLASSIC 1.25. Stent CATH VISTA GUIDE 6FR XBRCA guide catheter was inserted. Lesion crossed with guidewire using a WIRE ASAHI PROWATER 180CM. Pre-stent angioplasty was performed using a BALLOON SAPPHIRE 2.75X20. A drug-eluting stent was successfully placed using a SYNERGY XD 3.0X38. Stent strut is well apposed. Post-stent angioplasty was performed using a BALLOON SAPPHIRE [place] [id number]. The proximal part of the stent was postdilated. The entire stent was optimized with IVUS. Post-Intervention Lesion Assessment The intervention was successful. Pre-interventional TIMI flow is 1. Post-intervention TIMI flow is 3. There is a 0% residual stenosis post intervention.   CARDIAC CATHETERIZATION  CARDIAC CATHETERIZATION 08/10/2019  Narrative  Mid RCA lesion is 99% stenosed. There were left to  right collaterals feeding the distal RCA.  Mid LAD lesion is 90% stenosed.  After orbital atherectomy, a drug-eluting stent was successfully placed using a SYNERGY XD 2.50X28, postdialted to 3.0 mm.  Post intervention, there is a 0% residual stenosis.  There is mild left ventricular systolic dysfunction.  The left ventricular ejection fraction is 45-50% by visual estimate.  LV end diastolic pressure is normal.  There is no aortic valve stenosis.  Continue aggressive secondary prevention including aspirin and Plavix.  We will plan to bring her back for atherectomy of the RCA at a later time.  Would likely wait a couple of weeks.  We will watch her overnight since we had to use right groin for access site.  Findings Coronary Findings Diagnostic  Dominance: Right  Left Anterior Descending The vessel exhibits minimal luminal irregularities. Mid LAD lesion is  90% stenosed. The lesion is severely calcified.  Left Circumflex There is mild diffuse disease throughout the vessel.  Right Coronary Artery Mid RCA lesion is 99% stenosed. The lesion is severely calcified.  Right Posterior Descending Artery Collaterals RPDA filled by collaterals from 2nd Sept.  Intervention  Mid LAD lesion Atherectomy CATH LAUNCHER 6FR EBU 3.75 guide catheter was inserted. WIRE VIPERWIRE COR FLEX TIP guidewire was used to cross lesion. Orbital atherectomy was performed using a CROWN DIAMONDBACK CLASSIC 1.25. Stent CATH LAUNCHER 6FR EBU 3.75 guide catheter was inserted. Lesion crossed with guidewire using a WIRE ASAHI PROWATER 180CM. Pre-stent angioplasty was performed using a BALLOON SAPPHIRE 2.25X20. A drug-eluting stent was successfully placed using a SYNERGY XD 2.50X28. Stent strut is well apposed. Post-stent angioplasty was performed using a BALLOON SAPPHIRE [place] 3.0X18. Maximum pressure:  18 atm. IVUS was used to optimize size of post dilatation balloon.  No evidence of edge  dissection. Post-Intervention Lesion Assessment The intervention was successful. Pre-interventional TIMI flow is 3. Post-intervention TIMI flow is 3. No complications occurred at this lesion. Ultrasound (IVUS) was performed on the lesion post PCI. Stent well expanded. Lesion characteristics:  calcified. There is a 0% residual stenosis post intervention.   STRESS TESTS  MYOCARDIAL PERFUSION IMAGING 08/07/2022  Narrative   Findings are consistent with infarction. The study is intermediate risk due to reduced systolic function.   No ST deviation was noted.   LV perfusion is abnormal. There is no evidence of ischemia. There is evidence of infarction. Defect 1: There is a medium defect with moderate reduction in uptake present in the apical to mid inferior and inferolateral location(s) that is fixed. There is abnormal wall motion in the defect area. Consistent with infarction.   Left ventricular function is abnormal. Nuclear stress EF: 38 %. The left ventricular ejection fraction is moderately decreased (30-44%). End diastolic cavity size is mildly enlarged. End systolic cavity size is normal.   Prior study not available for comparison.   ECHOCARDIOGRAM  ECHOCARDIOGRAM COMPLETE 08/07/2022  Narrative ECHOCARDIOGRAM REPORT    Patient Name:   Kerri Snyder Date of Exam: 08/07/2022 Medical Rec #:  [id number]      Height:       65.0 in Accession #:    [id number]     Weight:       131.0 lb Date of Birth:  12-Feb-1941     BSA:          1.653 m Patient Age:    81 years       BP:           130/74 mmHg Patient Gender: F              HR:           87 bpm. Exam Location:  [address]  Procedure: 2D Echo, Cardiac Doppler, Color Doppler and Intracardiac Opacification Agent  Indications:    R01.1 Murmur  History:        Patient has prior history of Echocardiogram examinations, most recent 06/17/2013. CAD, Stroke, Signs/Symptoms:Chest Pain and Dyspnea; Risk Factors:Hypertension and Dyslipidemia.  Carotid artery disease. Bilateral carotid artery endarterectomy. PFO. Legal blindness.  Sonographer:    Cathie Beams RCS Referring Phys: [id number] CHRISTOPHER D MCALHANY  IMPRESSIONS   1. Global hypokinesis with akinesis of the inferior wall; overall moderate to severe LV dysfunction. 2. Left ventricular ejection fraction, by estimation, is 30 to 35%. The left ventricle has moderate to severely decreased function. The left ventricle demonstrates regional wall  motion abnormalities (see scoring diagram/findings for description). Left ventricular diastolic parameters are consistent with Grade II diastolic dysfunction (pseudonormalization). Elevated left atrial pressure. 3. Right ventricular systolic function is normal. The right ventricular size is normal. 4. Left atrial size was severely dilated. 5. The mitral valve is normal in structure. Moderate to severe mitral valve regurgitation. No evidence of mitral stenosis. Moderate mitral annular calcification. 6. The aortic valve is tricuspid. Aortic valve regurgitation is trivial. Aortic valve sclerosis/calcification is present, without any evidence of aortic stenosis. 7. The inferior vena cava is normal in size with greater than 50% respiratory variability, suggesting right atrial pressure of 3 mmHg.  Comparison(s): No prior Echocardiogram.  FINDINGS Left Ventricle: Left ventricular ejection fraction, by estimation, is 30 to 35%. The left ventricle has moderate to severely decreased function. The left ventricle demonstrates regional wall motion abnormalities. Definity contrast agent was given IV to delineate the left ventricular endocardial borders. The left ventricular internal cavity size was normal in size. There is no left ventricular hypertrophy. Left ventricular diastolic parameters are consistent with Grade II diastolic dysfunction (pseudonormalization). Elevated left atrial pressure.  Right Ventricle: The right ventricular size is normal.  Right ventricular systolic function is normal.  Left Atrium: Left atrial size was severely dilated.  Right Atrium: Right atrial size was normal in size.  Pericardium: There is no evidence of pericardial effusion.  Mitral Valve: The mitral valve is normal in structure. Moderate mitral annular calcification. Moderate to severe mitral valve regurgitation. No evidence of mitral valve stenosis.  Tricuspid Valve: The tricuspid valve is normal in structure. Tricuspid valve regurgitation is mild . No evidence of tricuspid stenosis.  Aortic Valve: The aortic valve is tricuspid. Aortic valve regurgitation is trivial. Aortic valve sclerosis/calcification is present, without any evidence of aortic stenosis.  Pulmonic Valve: The pulmonic valve was normal in structure. Pulmonic valve regurgitation is mild. No evidence of pulmonic stenosis.  Aorta: The aortic root is normal in size and structure.  Venous: The inferior vena cava is normal in size with greater than 50% respiratory variability, suggesting right atrial pressure of 3 mmHg.  IAS/Shunts: No atrial level shunt detected by color flow Doppler.  Additional Comments: Global hypokinesis with akinesis of the inferior wall; overall moderate to severe LV dysfunction.   LEFT VENTRICLE PLAX 2D LVIDd:         4.60 cm   Diastology LV PW:         1.00 cm   LV e' medial:    3.77 cm/s LV IVS:        1.00 cm   LV E/e' medial:  33.4 LVOT diam:     2.00 cm   LV e' lateral:   11.90 cm/s LV SV:         52        LV E/e' lateral: 10.6 LV SV Index:   31 LVOT Area:     3.14 cm   RIGHT VENTRICLE RV Basal diam:  3.70 cm RV S prime:     11.60 cm/s TAPSE (M-mode): 1.9 cm RVSP:           34.8 mmHg  LEFT ATRIUM             Index        RIGHT ATRIUM           Index LA diam:        4.10 cm 2.48 cm/m   RA Pressure: 3.00 mmHg LA Vol (A2C):   65.5 ml 39.64  ml/m  RA Area:     15.10 cm LA Vol (A4C):   81.2 ml 49.14 ml/m  RA Volume:   34.00 ml  20.57  ml/m LA Biplane Vol: 74.7 ml 45.20 ml/m AORTIC VALVE LVOT Vmax:   73.80 cm/s LVOT Vmean:  46.600 cm/s LVOT VTI:    0.165 m  AORTA Ao Root diam: 2.80 cm Ao Asc diam:  3.30 cm  MITRAL VALVE                TRICUSPID VALVE MV Area (PHT): 5.54 cm     TR Peak grad:   31.8 mmHg MV Decel Time: 137 msec     TR Vmax:        282.00 cm/s MR Peak grad: 136.9 mmHg    Estimated RAP:  3.00 mmHg MR Mean grad: 87.0 mmHg     RVSP:           34.8 mmHg MR Vmax:      585.00 cm/s MR Vmean:     441.0 cm/s    SHUNTS MV E velocity: 126.00 cm/s  Systemic VTI:  0.16 m MV A velocity: 91.20 cm/s   Systemic Diam: 2.00 cm MV E/A ratio:  1.38  Olga Millers MD Electronically signed by Olga Millers MD Signature Date/Time: 08/07/2022/9:01:33 AM    Final              EKG:  No new  Recent Labs: 08/13/2022: BUN 22; Creatinine, Ser 0.75; Potassium 4.3; Sodium 140  Recent Lipid Panel    Component Value Date/Time   CHOL 164 06/15/2021 0824   TRIG 86 06/15/2021 0824   HDL 66 06/15/2021 0824   CHOLHDL 2.5 06/15/2021 0824   CHOLHDL 3.0 09/10/2019 0312   VLDL 20 09/10/2019 0312   LDLCALC 82 06/15/2021 0824     Risk Assessment/Calculations:               Physical Exam:    VS:  BP 116/74   Pulse 88   Ht 5\' 5"  (1.651 m)   Wt 133 lb 3.2 oz (60.4 kg)   SpO2 98%   BMI 22.17 kg/m     Wt Readings from Last 3 Encounters:  09/05/22 133 lb 3.2 oz (60.4 kg)  08/13/22 133 lb 9.6 oz (60.6 kg)  08/07/22 131 lb (59.4 kg)     GEN:  Well nourished, well developed in no acute distress HEENT: Blind NECK: No JVD; No carotid bruits LYMPHATICS: No lymphadenopathy CARDIAC: RRR, no murmurs, rubs, gallops RESPIRATORY:  Clear to auscultation without rales, wheezing or rhonchi  ABDOMEN: Soft, non-tender, non-distended MUSCULOSKELETAL:  No edema; No deformity  SKIN: Warm and dry NEUROLOGIC:  Alert and oriented x 3 PSYCHIATRIC:  Normal affect   ASSESSMENT:    1. Coronary artery disease involving  native coronary artery of native heart with angina pectoris (HCC)   2. Mixed hyperlipidemia   3. HFrEF (heart failure with reduced ejection fraction) (HCC)    PLAN:    In order of problems listed above:  Coronary artery disease with angina Prior DES to LAD and DES to RCA in 2021.  Has prior history of chronic angina.  Recent Myoview showed inferior scar but no ischemia.  Echo did show EF of 30 to 35% with moderate to severe MR.  Initially we are going to try to adjust her medications to see if we can help improve her EF and possible functional MR.  Her symptoms remain fairly stable. No more arm hurting.  -  Plavix 75 Repatha 140 Imdur 60. - Her metoprolol tartrate was changed over to Toprol-XL 25 mg a day -Thankfully she is feeling much better.  Less shortness of breath.  We will continue with current medication management.  Chronic systolic heart failure - EF 30 to 35% possible ischemic.  Blood pressure has been fairly soft.  Did not think she was able to tolerate the [facility] and hesitant for Coreg.  Toprol 25 mg is being utilized as well as losartan 12.5 and HCTZ 25 with Imdur 60.  As above.  Continue with current medication management. - Checking basic metabolic profile today.  Hyperlipidemia - On Repatha, checking lipid panel          Medication Adjustments/Labs and Tests Ordered: Current medicines are reviewed at length with the patient today.  Concerns regarding medicines are outlined above.  Orders Placed This Encounter  Procedures   Lipid panel   Basic metabolic panel   No orders of the defined types were placed in this encounter.   Patient Instructions  Medication Instructions:  No changes *If you need a refill on your cardiac medications before your next appointment, please call your pharmacy*   Lab Work: Today: lipids/bmet  If you have labs (blood work) drawn today and your tests are completely normal, you will receive your results only by: MyChart Message (if  you have MyChart) OR A paper copy in the mail If you have any lab test that is abnormal or we need to change your treatment, we will call you to review the results.   Testing/Procedures: none   Follow-Up: At [facility], you and your health needs are our priority.  As part of our continuing mission to provide you with exceptional heart care, we have created designated Provider Care Teams.  These Care Teams include your primary Cardiologist (physician) and Advanced Practice Providers (APPs -  Physician Assistants and Nurse Practitioners) who all work together to provide you with the care you need, when you need it.   Your next appointment:   3 month(s)  Provider:   Tereso Newcomer, PA-C        Signed, Donato Schultz, MD  09/05/2022 12:09 PM    [facility]

## 2022-09-06 LAB — LIPID PANEL
Chol/HDL Ratio: 2.5 ratio (ref 0.0–4.4)
Cholesterol, Total: 162 mg/dL (ref 100–199)
HDL: 65 mg/dL (ref >39)
LDL Chol Calc (NIH): 81 mg/dL (ref 0–99)
Triglycerides: 87 mg/dL (ref 0–149)
VLDL Cholesterol Cal: 16 mg/dL (ref 5–40)

## 2022-09-06 LAB — BASIC METABOLIC PANEL
BUN/Creatinine Ratio: 30 — ABNORMAL HIGH (ref 12–28)
BUN: 26 mg/dL (ref 8–27)
CO2: 24 mmol/L (ref 20–29)
Calcium: 10 mg/dL (ref 8.7–10.3)
Chloride: 103 mmol/L (ref 96–106)
Creatinine, Ser: 0.88 mg/dL (ref 0.57–1.00)
Glucose: 93 mg/dL (ref 70–99)
Potassium: 4.3 mmol/L (ref 3.5–5.2)
Sodium: 142 mmol/L (ref 134–144)
eGFR: 65 mL/min/{1.73_m2} (ref >59)

## 2022-11-02 ENCOUNTER — Other Ambulatory Visit: Payer: Self-pay | Admitting: Cardiology

## 2022-11-03 ENCOUNTER — Other Ambulatory Visit: Payer: Self-pay | Admitting: Cardiology

## 2022-11-03 DIAGNOSIS — E782 Mixed hyperlipidemia: Secondary | ICD-10-CM

## 2022-11-03 DIAGNOSIS — I25119 Atherosclerotic heart disease of native coronary artery with unspecified angina pectoris: Secondary | ICD-10-CM

## 2022-11-03 DIAGNOSIS — I739 Peripheral vascular disease, unspecified: Secondary | ICD-10-CM

## 2022-11-12 ENCOUNTER — Telehealth: Payer: Self-pay | Admitting: Cardiology

## 2022-11-12 NOTE — Telephone Encounter (Signed)
Spoke with pt who is reporting for awhile now, about bedtime and after laying down her legs hurt from her ankles up to her thighs.  She reports it feels like someone is stabbing her.  Very sharp pain.  She uses blu-emu cream and has some bar soap shavings under her sheets and believes this helps some but does still take awhile before she can go to sleep.  Walking sometimes helps.  This occurs approximately 4 nights out of the week.  She report she had ran out of her Losartan POTASSIUM for a few days last week and thinks the pain was lessened.  It has returned since restarting this medication.  She also reports if she eats a banana her legs cramp.  She feels she is taking too much potassium.  Explained to pt she is not taking a potassium supplement that has been rxed by this office.  She believes the Losartan Potassium is causing the issue.  No recent lab work here or at her PCP office.   Last K+ here on 09/05/22 was 4.3.   Advised I will have Dr Anne Fu review this information and call back with any new orders or recommendations.  Of note, losartan 25 mg take 1/2 tablet daily and Toprol XL 25 mg daily were started by Lilian Coma on 08/13/22 d/t EF of 30-35% on most recent echo.

## 2022-11-12 NOTE — Telephone Encounter (Signed)
Pt is requesting a callback due to her having some leg pain and feeling very tired but no other symptoms. She is at work right now and stated to just leave a message on her home phone and she'll return the call when she's off work. Please advise.

## 2022-11-12 NOTE — Telephone Encounter (Signed)
Left message for pt to call back once she is off work.

## 2022-11-12 NOTE — Telephone Encounter (Signed)
Pt returning nurses phone call. Please advise ?

## 2022-11-13 NOTE — Telephone Encounter (Signed)
Jake Bathe, MD  You5 hours ago (6:50 AM)    Losartan potassium actually does not have any significant amounts of potassium in it. OK to stop it since it making her feel so bad   Left message for pt to d/c Losartan and see if this helps improve her symptoms.  Requested she call if if any questions/concerns.

## 2022-12-04 ENCOUNTER — Ambulatory Visit: Payer: Medicare Other | Attending: Physician Assistant | Admitting: Physician Assistant

## 2022-12-04 ENCOUNTER — Encounter: Payer: Self-pay | Admitting: *Deleted

## 2022-12-04 ENCOUNTER — Encounter: Payer: Self-pay | Admitting: Physician Assistant

## 2022-12-04 VITALS — BP 140/70 | HR 72 | Ht 64.0 in | Wt 133.8 lb

## 2022-12-04 DIAGNOSIS — I5020 Unspecified systolic (congestive) heart failure: Secondary | ICD-10-CM

## 2022-12-04 DIAGNOSIS — I25119 Atherosclerotic heart disease of native coronary artery with unspecified angina pectoris: Secondary | ICD-10-CM

## 2022-12-04 DIAGNOSIS — R079 Chest pain, unspecified: Secondary | ICD-10-CM | POA: Diagnosis not present

## 2022-12-04 DIAGNOSIS — I340 Nonrheumatic mitral (valve) insufficiency: Secondary | ICD-10-CM | POA: Diagnosis not present

## 2022-12-04 DIAGNOSIS — E782 Mixed hyperlipidemia: Secondary | ICD-10-CM

## 2022-12-04 DIAGNOSIS — I10 Essential (primary) hypertension: Secondary | ICD-10-CM

## 2022-12-04 LAB — TROPONIN T: Troponin T (Highly Sensitive): 18 ng/L — ABNORMAL HIGH (ref 0–14)

## 2022-12-04 MED ORDER — METOPROLOL SUCCINATE ER 50 MG PO TB24: 50.0000 mg | ORAL_TABLET | Freq: Every day | ORAL | 3 refills | Status: DC

## 2022-12-04 NOTE — Assessment & Plan Note (Signed)
Moderate to severe on echocardiogram in April 2024.  As noted plan repeat echocardiogram once GDMT maximized.

## 2022-12-04 NOTE — Assessment & Plan Note (Signed)
History of DES to the mid LAD in April 2021 and DES x 2 to the RCA in May 2021.  She had a nuclear stress test in April 2024 with inferior infarct but no ischemia.  Echocardiogram demonstrated worsening LV function with an EF of 30-35.  We have attempted to treat her medically with an eye towards reassessing ejection fraction once she is on maximally tolerated GDMT.  She had recent episodes of throat pain and left arm pain, more intense than previous episodes. No associated shortness of breath, nausea, or diaphoresis. EKG unchanged without acute changes. -Obtain high-sensitivity troponin to rule out recent myocardial infarction. -Increase Metoprolol to succinate from 25mg  to 50mg  daily for symptom control and blood pressure management. -Continue Plavix 75 mg daily, Repatha, Imdur 60 mg daily -Return in 2 weeks for follow-up, unless symptoms persist or worsen.  ADDENDUM: hsTroponin returned 18 which is slightly elevated. I reviewed her case with Dr. Izora Ribas (attending MD). I called the pt at home. She has not had further episodes. I do not think the hsTrop is significant and she does not need to go to the ED. However, if she has recurrent chest pain, she will need to go to the ED. She understands this. Will proceed with plan above unless she has recurrent chest pain.

## 2022-12-04 NOTE — Progress Notes (Signed)
Cardiology Office Note:    Date:  12/04/2022  ID:  SEIDY TRENTHAM, DOB 06-28-1940, MRN [id number] PCP: Merri Brunette, MD  [facility] Providers Cardiologist:  Donato Schultz, MD       Patient Profile:      Coronary artery disease  S/p orbital atherectomy and 2.5 x 28 mm DES to mLAD in 07/2019 S/p orbital atherectomy and 3.5 x 24 mm DES to pRCA and 3 x 38 mm DES to mRCA in 08/2019 LHC 09/09/19: pLAD 25, mLAD stent patent; LCx irregs; RPAV 75 Chronic stable angina Myoview 08/07/22: inf and inf-lat infarct, no ischemia, EF 38; int risk (HFrEF) heart failure with reduced ejection fraction  TEE 06/17/13: EF 50-55 // LV gram 07/2019: EF 45-50 TTE 08/07/22: inf AK, EF 30-35, Gr 2 DD, NL RVSF, severe LAE, mod to severe MR, trivial AI, RAP 3 Mitral regurgitation TTE 07/2022: mod to severe MR Carotid artery disease S/p bilateral CEAs Carotid US (VVS) 09/16/14: patent bilat CEAs Hx of CVA Hypertension  Hyperlipidemia  Legal blindness Anxiety              Discussed the use of AI scribe software for clinical note transcription with the patient, who gave verbal consent to proceed.  History of Present Illness   Miss Geiger, an 82 year old with a history of CHF, CAD, and mitral regurgitation, presents for follow up. She notes recent episode of chest pain. The patient was last seen in May 2024 and had called in July 2024 with leg pain that improved after stopping losartan. The patient also reports occasional episodes of chest and throat discomfort, with the most recent episode occurring the night before and lasting about 30-45 minutes. The patient describes the pain as intense, rating it an 8 on a scale of 1-10, and located in the upper left arm. The patient denies any nausea, sweating, or loss of consciousness during these episodes. The patient also reports no trouble breathing when laying flat, no swelling in the legs, and no other symptoms such as fevers, chills, cough, vomiting, diarrhea, or bloody  stools.     ROS:  See HPI No fevers, chills, cough, melena, hematochezia.    Studies Reviewed:   EKG Interpretation Date/Time:  Wednesday December 04 2022 14:06:51 EDT Ventricular Rate:  64 PR Interval:  146 QRS Duration:  94 QT Interval:  418 QTC Calculation: 431 R Axis:   28  Text Interpretation: Normal sinus rhythm Anterior infarct , age undetermined Inf TW inversions No significant change since last tracing Confirmed by Tereso Newcomer [phone]) on 12/04/2022 2:10:59 PM    Risk Assessment/Calculations:     HYPERTENSION CONTROL Vitals:   12/04/22 1324 12/04/22 1359  BP: 102/60 (!) 140/70    The patient's blood pressure is elevated above target today.  In order to address the patient's elevated BP: A current anti-hypertensive medication was adjusted today.          Physical Exam:   VS:  BP (!) 140/70   Pulse 72   Ht 5\' 4"  (1.626 m)   Wt 133 lb 12.8 oz (60.7 kg)   SpO2 97%   BMI 22.97 kg/m    Wt Readings from Last 3 Encounters:  12/04/22 133 lb 12.8 oz (60.7 kg)  09/05/22 133 lb 3.2 oz (60.4 kg)  08/13/22 133 lb 9.6 oz (60.6 kg)    Constitutional:      Appearance: Healthy appearance. Not in distress.  Neck:     Vascular: JVD normal.  Pulmonary:  Breath sounds: Normal breath sounds. No wheezing. No rales.  Cardiovascular:     Normal rate. Regular rhythm.     Murmurs: There is a grade 2/6 systolic murmur at the ULSB.  Edema:    Peripheral edema absent.  Abdominal:     Palpations: Abdomen is soft.  Skin:    General: Skin is warm and dry.        Assessment and Plan:  Coronary artery disease involving native coronary artery of native heart with angina pectoris [facility]) History of DES to the mid LAD in April 2021 and DES x 2 to the RCA in May 2021.  She had a nuclear stress test in April 2024 with inferior infarct but no ischemia.  Echocardiogram demonstrated worsening LV function with an EF of 30-35.  We have attempted to treat her medically with an eye towards  reassessing ejection fraction once she is on maximally tolerated GDMT.  She had recent episodes of throat pain and left arm pain, more intense than previous episodes. No associated shortness of breath, nausea, or diaphoresis. EKG unchanged without acute changes. -Obtain high-sensitivity troponin to rule out recent myocardial infarction. -Increase Metoprolol to succinate from 25mg  to 50mg  daily for symptom control and blood pressure management. -Continue Plavix 75 mg daily, Repatha, Imdur 60 mg daily -Return in 2 weeks for follow-up, unless symptoms persist or worsen.  ADDENDUM: hsTroponin returned 18 which is slightly elevated. I reviewed her case with Dr. Izora Ribas (attending MD). I called the pt at home. She has not had further episodes. I do not think the hsTrop is significant and she does not need to go to the ED. However, if she has recurrent chest pain, she will need to go to the ED. She understands this. Will proceed with plan above unless she has recurrent chest pain.  HFrEF (heart failure with reduced ejection fraction) (HCC) EF 30-35.  Likely ischemic cardiomyopathy.  NYHA II.  Volume status stable.  -Intolerance to Losartan due to leg pain. -Continue isosorbide mononitrate 60 mg daily -Increase metoprolol succinate to 50 mg daily as noted. -Consider SGLT2 inhibitor versus MRA at follow-up -Reassess heart function after optimization of Guideline Directed Medical Therapy (GDMT).  Mixed hyperlipidemia LDL close to goal on Repatha 140mg  every 2 weeks. Declined statin therapy. -Continue Repatha 140mg  every 2 weeks.  Essential hypertension Overall controlled.  Currently managed with HCTZ 25mg  daily, Imdur 60mg  daily, and Toprol 25mg  daily. -Increase Toprol to 50mg  daily for antianginal control.  Moderate to severe mitral regurgitation Moderate to severe on echocardiogram in April 2024.  As noted plan repeat echocardiogram once GDMT maximized.        Dispo:  Return in about 2  weeks (around 12/18/2022) for Close Follow Up w/ Dr. Anne Fu, or Tereso Newcomer, PA-C.  Signed, Tereso Newcomer, PA-C

## 2022-12-04 NOTE — Assessment & Plan Note (Signed)
Overall controlled.  Currently managed with HCTZ 25mg  daily, Imdur 60mg  daily, and Toprol 25mg  daily. -Increase Toprol to 50mg  daily for antianginal control.

## 2022-12-04 NOTE — Assessment & Plan Note (Addendum)
EF 30-35.  Likely ischemic cardiomyopathy.  NYHA II.  Volume status stable.  -Intolerance to Losartan due to leg pain. -Continue isosorbide mononitrate 60 mg daily -Increase metoprolol succinate to 50 mg daily as noted. -Consider SGLT2 inhibitor versus MRA at follow-up -Reassess heart function after optimization of Guideline Directed Medical Therapy (GDMT).

## 2022-12-04 NOTE — Assessment & Plan Note (Signed)
LDL close to goal on Repatha 140mg  every 2 weeks. Declined statin therapy. -Continue Repatha 140mg  every 2 weeks.

## 2022-12-04 NOTE — Patient Instructions (Addendum)
Medication Instructions:  Your physician has recommended you make the following change in your medication:   INCREASE Metoprolol to 50 mg taking 1 daily You can use 2 of the 25 mg tablets to use them up  *If you need a refill on your cardiac medications before your next appointment, please call your pharmacy*   Lab Work: TODAY:   STAT TROPONIN  If you have labs (blood work) drawn today and your tests are completely normal, you will receive your results only by: MyChart Message (if you have MyChart) OR A paper copy in the mail If you have any lab test that is abnormal or we need to change your treatment, we will call you to review the results.   Testing/Procedures: None ordered   Follow-Up: At [facility], you and your health needs are our priority.  As part of our continuing mission to provide you with exceptional heart care, we have created designated Provider Care Teams.  These Care Teams include your primary Cardiologist (physician) and Advanced Practice Providers (APPs -  Physician Assistants and Nurse Practitioners) who all work together to provide you with the care you need, when you need it.  We recommend signing up for the patient portal called "MyChart".  Sign up information is provided on this After Visit Summary.  MyChart is used to connect with patients for Virtual Visits (Telemedicine).  Patients are able to view lab/test results, encounter notes, upcoming appointments, etc.  Non-urgent messages can be sent to your provider as well.   To learn more about what you can do with MyChart, go to [email or web address].    Your next appointment:   2-3 week(s)  12/25/22 arrive at 9:50  Provider:   Donato Schultz, MD  or Tereso Newcomer, PA-C         Other Instructions

## 2022-12-21 ENCOUNTER — Encounter (HOSPITAL_COMMUNITY): Payer: Self-pay

## 2022-12-21 ENCOUNTER — Ambulatory Visit (HOSPITAL_COMMUNITY)
Admission: EM | Admit: 2022-12-21 | Discharge: 2022-12-21 | Disposition: A | Discharge status: Home / Self Care | Payer: Medicare Other | Attending: Physician Assistant | Admitting: Physician Assistant

## 2022-12-21 DIAGNOSIS — U071 COVID-19: Secondary | ICD-10-CM | POA: Diagnosis present

## 2022-12-21 DIAGNOSIS — I5022 Chronic systolic (congestive) heart failure: Secondary | ICD-10-CM | POA: Diagnosis not present

## 2022-12-21 DIAGNOSIS — J069 Acute upper respiratory infection, unspecified: Secondary | ICD-10-CM

## 2022-12-21 DIAGNOSIS — R0981 Nasal congestion: Secondary | ICD-10-CM | POA: Diagnosis not present

## 2022-12-21 DIAGNOSIS — Z7902 Long term (current) use of antithrombotics/antiplatelets: Secondary | ICD-10-CM | POA: Insufficient documentation

## 2022-12-21 DIAGNOSIS — Z8673 Personal history of transient ischemic attack (TIA), and cerebral infarction without residual deficits: Secondary | ICD-10-CM | POA: Diagnosis not present

## 2022-12-21 DIAGNOSIS — R051 Acute cough: Secondary | ICD-10-CM | POA: Diagnosis not present

## 2022-12-21 DIAGNOSIS — I2510 Atherosclerotic heart disease of native coronary artery without angina pectoris: Secondary | ICD-10-CM | POA: Diagnosis not present

## 2022-12-21 DIAGNOSIS — I110 Hypertensive heart disease with heart failure: Secondary | ICD-10-CM | POA: Insufficient documentation

## 2022-12-21 MED ORDER — FLUTICASONE PROPIONATE 50 MCG/ACT NA SUSP: 1.0000 | Freq: Every day | NASAL | 0 refills | Status: DC

## 2022-12-21 MED ORDER — BENZONATATE 100 MG PO CAPS: 100.0000 mg | ORAL_CAPSULE | Freq: Three times a day (TID) | ORAL | 0 refills | Status: DC

## 2022-12-21 NOTE — ED Provider Notes (Signed)
[facility]    CSN: [id number] Arrival date & time: 12/21/22  1515      History   Chief Complaint Chief Complaint  Patient presents with   Nasal Congestion   Cough    HPI Kerri Snyder is a 82 y.o. female.   Patient presents today with a 2-day history of URI symptoms.  She reports rhinorrhea/nasal congestion and cough.  Denies any fever, chest pain, shortness of breath, nausea, vomiting, diarrhea.  She denies any known sick contacts but does still work at the [employer] and so is exposed to many people.  She has never had COVID.  She has had COVID-19 vaccines.  She has tried Coricidin HBP without improvement of symptoms.  Denies any recent antibiotics or steroids.  She does have a history of cardiovascular disease.  She does use Plavix daily for history of CVA as well as previous stent placement.  Reports her sents were placed several years ago.  She denies any history of asthma or COPD.  She does not smoke.    Past Medical History:  Diagnosis Date   Albinism (HCC)    Anxiety disorder, unspecified    Blind    legally blind   Carotid artery occlusion    Cerebrovascular disease, unspecified    Coronary artery disease    DDD (degenerative disc disease), lumbar    Diverticulitis large intestine w/o perforation or abscess w/o bleeding    Exercise-induced angina    Family history of anesthesia complication    cousin with nausea and vomiting post anesthesia   First degree hemorrhoids    Hereditary and idiopathic neuropathy, unspecified    Hyperlipidemia    Hypertension    Osteopenia    PFO (patent foramen ovale)    Sciatica    Stable angina    Stroke (HCC) 3/15    Patient Active Problem List   Diagnosis Date Noted   HFrEF (heart failure with reduced ejection fraction) (HCC) 08/13/2022   Moderate to severe mitral regurgitation 08/12/2022   Albinism (HCC) 02/27/2021   Anxiety disorder 02/27/2021   Atrial septal defect within oval fossa 02/27/2021    Diverticular disease of colon 02/27/2021   First degree hemorrhoids 02/27/2021   Hereditary and idiopathic neuropathy, unspecified 02/27/2021   Osteopenia 02/27/2021   Peripheral vascular disease (HCC) 02/27/2021   Essential hypertension 02/27/2021   Closed fracture of distal phalanx of finger 09/05/2020   Coronary artery disease involving native coronary artery of native heart with angina pectoris (HCC) 09/09/2019   Nonspecific abnormal electrocardiogram (ECG) (EKG) 08/06/2019   Legally blind 08/06/2019   History of stroke 08/06/2019   Aftercare following surgery of the circulatory system, NEC 08/24/2013   Occlusion and stenosis of carotid artery without mention of cerebral infarction 07/13/2013   Carotid stenosis 06/25/2013   TIA (transient ischemic attack) 06/14/2013   Slurred speech 06/14/2013   Elevated blood pressure 06/14/2013   Mixed hyperlipidemia 06/14/2013   Supratherapeutic INR 06/14/2013   CVA (cerebral infarction) 06/14/2013    Past Surgical History:  Procedure Laterality Date   CAROTID ENDARTERECTOMY Left June 25, 2013   cea   CAROTID ENDARTERECTOMY Right August 06, 2013   cea   CESAREAN SECTION     CORONARY ATHERECTOMY N/A 09/09/2019   Procedure: CORONARY ATHERECTOMY;  Surgeon: Corky Crafts, MD;  Location: [facility] INVASIVE CV LAB;  Service: Cardiovascular;  Laterality: N/A;   CORONARY STENT INTERVENTION N/A 08/10/2019   Procedure: CORONARY STENT INTERVENTION;  Surgeon: Corky Crafts,  MD;  Location: [facility] INVASIVE CV LAB;  Service: Cardiovascular;  Laterality: N/A;   CORONARY STENT INTERVENTION  09/09/2019   CORONARY STENT INTERVENTION N/A 09/09/2019   Procedure: CORONARY STENT INTERVENTION;  Surgeon: Corky Crafts, MD;  Location: [facility] INVASIVE CV LAB;  Service: Cardiovascular;  Laterality: N/A;   CORONARY ULTRASOUND/IVUS N/A 08/10/2019   Procedure: Intravascular Ultrasound/IVUS;  Surgeon: Corky Crafts, MD;  Location: [facility] INVASIVE CV LAB;   Service: Cardiovascular;  Laterality: N/A;   CORONARY ULTRASOUND/IVUS N/A 09/09/2019   Procedure: Intravascular Ultrasound/IVUS;  Surgeon: Corky Crafts, MD;  Location: [facility] INVASIVE CV LAB;  Service: Cardiovascular;  Laterality: N/A;   Ear drum replacement 2000 Right 2000   ENDARTERECTOMY Left 06/25/2013   Procedure: ENDARTERECTOMY CAROTID;  Surgeon: Larina Earthly, MD;  Location: [facility] OR;  Service: Vascular;  Laterality: Left;   ENDARTERECTOMY Right 08/06/2013   Procedure:  Carotid Endarterectomy with hemashield patch angioplasty;  Surgeon: Larina Earthly, MD;  Location: [facility] OR;  Service: Vascular;  Laterality: Right;   LEFT HEART CATH AND CORONARY ANGIOGRAPHY N/A 08/10/2019   Procedure: LEFT HEART CATH AND CORONARY ANGIOGRAPHY;  Surgeon: Corky Crafts, MD;  Location: [facility] INVASIVE CV LAB;  Service: Cardiovascular;  Laterality: N/A;   LEFT HEART CATH AND CORONARY ANGIOGRAPHY N/A 09/09/2019   Procedure: LEFT HEART CATH AND CORONARY ANGIOGRAPHY;  Surgeon: Corky Crafts, MD;  Location: [facility] INVASIVE CV LAB;  Service: Cardiovascular;  Laterality: N/A;   TEE WITHOUT CARDIOVERSION N/A 06/17/2013   Procedure: TRANSESOPHAGEAL ECHOCARDIOGRAM (TEE);  Surgeon: Vesta Mixer, MD;  Location: [facility] ENDOSCOPY;  Service: Cardiovascular;  Laterality: N/A;   TONSILLECTOMY     TUBAL LIGATION      OB History   No obstetric history on file.      Home Medications    Prior to Admission medications   Medication Sig Start Date End Date Taking? Authorizing Provider  benzonatate (TESSALON) 100 MG capsule Take 1 capsule (100 mg total) by mouth every 8 (eight) hours. 12/21/22  Yes Leng Montesdeoca K, PA-C  fluticasone (FLONASE) 50 MCG/ACT nasal spray Place 1 spray into both nostrils daily. 12/21/22  Yes Emmagrace Runkel, Denny Peon K, PA-C  ALPRAZolam (XANAX) 0.25 MG tablet Take 0.25 mg by mouth 2 (two) times daily. 01/26/21   [provider]  Ascorbic Acid (VITAMIN C PO) Take 1 tablet by mouth every morning.    [provider]  Cholecalciferol (VITAMIN D PO) Take 1 tablet by mouth daily.     [provider]  clopidogrel (PLAVIX) 75 MG tablet Take 1 tablet (75 mg total) by mouth daily with breakfast. 06/17/13   Regalado, Belkys A, MD  Evolocumab (REPATHA SURECLICK) 140 MG/ML SOAJ INJECT 140 MG INTO THE SKIN EVERY 14 (FOURTEEN) DAYS. 11/04/22   Jake Bathe, MD  hydrochlorothiazide (HYDRODIURIL) 25 MG tablet Take 25 mg by mouth at bedtime.    [provider]  hydroxypropyl methylcellulose (ISOPTO TEARS) 2.5 % ophthalmic solution Place 1 drop into both eyes 4 (four) times daily as needed for dry eyes.    [provider]  isosorbide mononitrate (IMDUR) 60 MG 24 hr tablet TAKE 1 TABLET BY MOUTH EVERY DAY 11/04/22   Jake Bathe, MD  ketoconazole (NIZORAL) 2 % cream 1 application to affected area 12/27/09   [provider]  metoprolol succinate (TOPROL-XL) 50 MG 24 hr tablet Take 1 tablet (50 mg total) by mouth daily. Take with or immediately following a meal. 12/04/22   Tereso Newcomer T, PA-C  nitroGLYCERIN (NITROSTAT) 0.4 MG SL tablet Place 1 tablet (0.4 mg total) under the tongue every 5 (five) minutes as needed for chest pain. 07/04/22   Jake Bathe, MD  vitamin B-12 (CYANOCOBALAMIN) 1000 MCG tablet 1 tablet    [provider]    Family History Family History  Problem Relation Age of Onset   Diabetes Father    Heart disease Father        Before age 58   Alcohol abuse Father    Alzheimer's disease Mother     Social History Social History   Tobacco Use   Smoking status: Never   Smokeless tobacco: Never  Vaping Use   Vaping status: Never Used  Substance Use Topics   Alcohol use: No    Alcohol/week: 0.0 standard drinks of alcohol   Drug use: No     Allergies   Pitavastatin and Rosuvastatin   Review of Systems Review of Systems  Constitutional:  Positive for activity change. Negative for appetite change, fatigue and fever.  HENT:  Positive for  congestion and rhinorrhea. Negative for postnasal drip, sinus pressure, sneezing and sore throat.   Respiratory:  Positive for cough. Negative for shortness of breath.   Cardiovascular:  Negative for chest pain.  Gastrointestinal:  Negative for abdominal pain, diarrhea, nausea and vomiting.  Neurological:  Negative for dizziness, light-headedness and headaches.     Physical Exam Triage Vital Signs ED Triage Vitals  Encounter Vitals Group     BP 12/21/22 1623 (!) 163/88     Systolic BP Percentile --      Diastolic BP Percentile --      Pulse Rate 12/21/22 1623 83     Resp 12/21/22 1623 14     Temp 12/21/22 1623 97.9 F (36.6 C)     Temp Source 12/21/22 1623 Oral     SpO2 12/21/22 1623 97 %     Weight --      Height --      Head Circumference --      Peak Flow --      Pain Score 12/21/22 1625 0     Pain Loc --      Pain Education --      Exclude from Growth Chart --    No data found.  Updated Vital Signs BP (!) 163/88 (BP Location: Left Arm)   Pulse 83   Temp 97.9 F (36.6 C) (Oral)   Resp 14   SpO2 97%   Visual Acuity Right Eye Distance:   Left Eye Distance:   Bilateral Distance:    Right Eye Near:   Left Eye Near:    Bilateral Near:     Physical Exam Vitals reviewed.  Constitutional:      General: She is awake. She is not in acute distress.    Appearance: Normal appearance. She is well-developed. She is not ill-appearing.     Comments: Very pleasant female appears stated age in no acute distress sitting comfortably in exam room  HENT:     Head: Normocephalic and atraumatic.     Right Ear: Tympanic membrane, ear canal and external ear normal. Tympanic membrane is not erythematous or bulging.     Left Ear: Tympanic membrane, ear canal and external ear normal. Tympanic membrane is not erythematous or bulging.     Nose:     Right Sinus: No maxillary sinus tenderness or frontal sinus tenderness.     Left Sinus: No maxillary sinus tenderness or frontal sinus  tenderness.     Mouth/Throat:     Pharynx: Uvula midline. No oropharyngeal exudate or posterior oropharyngeal erythema.  Cardiovascular:     Rate and Rhythm: Normal rate and regular rhythm.     Heart sounds: Normal heart sounds, S1 normal and S2 normal. No murmur heard. Pulmonary:     Effort: Pulmonary effort is normal.     Breath sounds: Normal breath sounds. No wheezing, rhonchi or rales.     Comments: Clear to auscultation bilaterally Psychiatric:        Behavior: Behavior is cooperative.      UC Treatments / Results  Labs (all labs ordered are listed, but only abnormal results are displayed) Labs Reviewed  SARS CORONAVIRUS 2 (TAT 6-24 HRS)    EKG   Radiology No results found.  Procedures Procedures (including critical care time)  Medications Ordered in UC Medications - No data to display  Initial Impression / Assessment and Plan / UC Course  I have reviewed the triage vital signs and the nursing notes.  Pertinent labs & imaging results that were available during my care of the patient were reviewed by me and considered in my medical decision making (see chart for details).     Patient is well-appearing, afebrile, nontoxic, nontachycardic.  No evidence of acute infection on physical exam though would warrant initiation of antibiotics.  Discussed likely viral illness.  COVID testing was obtained.  Chest x-ray was deferred as oxygen saturation was 97% and she had no adventitious lung sounds on exam.  Will treat symptomatically with Tessalon and Flonase.  If she is positive for COVID she is a candidate for antiviral therapy given her age and past medical history.  Given her use of Plavix I would prefer molnupiravir over Paxlovid due to concern for medication interaction in decreased effectiveness of Plavix.  Recommended she follow-up with her primary care next week.  We discussed that if she has any worsening symptoms including chest pain, shortness of breath, fever,  weakness, nausea/vomiting interfere with her intake, worsening cough she needs to be seen immediately.  Strict return precautions given.  Final Clinical Impressions(s) / UC Diagnoses   Final diagnoses:  Upper respiratory tract infection, unspecified type  Acute cough  Nasal congestion     Discharge Instructions      We are testing you for COVID.  We will contact you if this is positive tomorrow.  Continue over-the-counter medications such as Coricidin HBP.  I have sent in Tessalon to help with your cough as well as fluticasone nasal spray to help with your congestion.  Make sure that you rest and drink plenty of fluid.  If at any point you have worsening symptoms including shortness of breath, worsening cough, fever, chest pain, nausea/vomiting interfere with oral intake, weakness you need to go to the emergency room.     ED Prescriptions     Medication Sig Dispense Auth. Provider   fluticasone (FLONASE) 50 MCG/ACT nasal spray Place 1 spray into both nostrils daily. 16 g Aira Sallade K, PA-C   benzonatate (TESSALON) 100 MG capsule Take 1 capsule (100 mg total) by mouth every 8 (eight) hours. 21 capsule Dejai Schubach K, PA-C      PDMP not reviewed this encounter.   Jeani Hawking, PA-C 12/21/22 1642

## 2022-12-21 NOTE — Discharge Instructions (Signed)
We are testing you for COVID.  We will contact you if this is positive tomorrow.  Continue over-the-counter medications such as Coricidin HBP.  I have sent in Tessalon to help with your cough as well as fluticasone nasal spray to help with your congestion.  Make sure that you rest and drink plenty of fluid.  If at any point you have worsening symptoms including shortness of breath, worsening cough, fever, chest pain, nausea/vomiting interfere with oral intake, weakness you need to go to the emergency room.

## 2022-12-21 NOTE — ED Triage Notes (Signed)
Patient reports that she has had a runny nose and a non productive cough x 2 days.  Patient states she has been taking Coricidin HP and the last dose was at 0800 today.

## 2022-12-22 LAB — SARS CORONAVIRUS 2 (TAT 6-24 HRS): SARS Coronavirus 2: POSITIVE — AB

## 2022-12-25 ENCOUNTER — Ambulatory Visit: Payer: Medicare Other | Admitting: Physician Assistant

## 2023-01-17 NOTE — Progress Notes (Unsigned)
Cardiology Office Note:    Date:  01/20/2023  ID:  Kerri Snyder, DOB 1940-07-08, MRN [id number] PCP: Merri Brunette, MD   [facility] Providers Cardiologist:  Donato Schultz, MD       Patient Profile:      Coronary artery disease  S/p orbital atherectomy and 2.5 x 28 mm DES to mLAD in 07/2019 S/p orbital atherectomy and 3.5 x 24 mm DES to pRCA and 3 x 38 mm DES to mRCA in 08/2019 LHC 09/09/19: pLAD 25, mLAD stent patent; LCx irregs; RPAV 75 Chronic stable angina Myoview 08/07/22: inf and inf-lat infarct, no ischemia, EF 38; int risk (HFrEF) heart failure with reduced ejection fraction  TEE 06/17/13: EF 50-55 // LV gram 07/2019: EF 45-50 TTE 08/07/22: inf AK, EF 30-35, Gr 2 DD, NL RVSF, severe LAE, mod to severe MR, trivial AI, RAP 3 Mitral regurgitation TTE 07/2022: mod to severe MR Carotid artery disease S/p bilateral CEAs Carotid US (VVS) 09/16/14: patent bilat CEAs Hx of CVA Hypertension  Hyperlipidemia  Legal blindness Anxiety            History of Present Illness:  Discussed the use of AI scribe software for clinical note transcription with the patient, who gave verbal consent to proceed.  Kerri Snyder is a 82 y.o. female who returns for follow-up of CAD, CHF, MR.  She was last seen 12/04/2022.  She just had a recent episode of throat and left arm pain.  Troponin was obtained and was 18.  This was felt to be unremarkable.  Her metoprolol was increased to 50 mg daily she has not tolerated losartan secondary to leg pain. We will need to consider SGLT 2 inhibitor, MRA in the future plan to repeat echocardiogram once GDMT maximized.   She is here alone.  She reports experiencing chest discomfort and shortness of breath, particularly when climbing stairs or doing physical activities. The discomfort is described as a tightness in her chest and throat. She notes that these symptoms have been stable and have not worsened since her last visit. The patient also reports bilateral calf pain  when walking, which has been worsening over time. She attributes this pain to long-term statin use and believes it is the primary reason for her limited mobility. She denies any recent episodes of syncope, orthopnea, leg edema.      Review of Systems  Respiratory:  Negative for cough.   Gastrointestinal:  Negative for diarrhea and vomiting.  See HPI     Studies Reviewed:       Results  from [facility] Troponin: Unremarkable Total cholesterol: 185 mg/dL (40/98/1191) HDL: 72 mg/dL (47/82/9562) LDL: 91 mg/dL (13/11/6576) Triglycerides: 128 mg/dL (46/96/2952) Hemoglobin: 13.7 g/dL (84/13/2440) Creatinine: 0.78 mg/dL (02/09/2535) Potassium: 4.8 mmol/L (01/03/2023) ALT: 18 U/L (01/03/2023) TSH: 1.1 mIU/L (01/03/2023)     Risk Assessment/Calculations:             Physical Exam:   VS:  BP 130/66   Pulse 75   Ht 5\' 4"  (1.626 m)   Wt 139 lb (63 kg)   SpO2 95%   BMI 23.86 kg/m    Wt Readings from Last 3 Encounters:  01/20/23 139 lb (63 kg)  12/04/22 133 lb 12.8 oz (60.7 kg)  09/05/22 133 lb 3.2 oz (60.4 kg)    Constitutional:      Appearance: Healthy appearance. Not in distress.  Neck:     Vascular: No JVR.  Pulmonary:     Breath sounds: Normal breath  sounds. No wheezing. No rales.  Cardiovascular:     Normal rate. Regular rhythm.     Murmurs: There is a grade 2/6 systolic murmur at the LLSB.  Edema:    Peripheral edema absent.  Abdominal:     Palpations: Abdomen is soft.        Assessment and Plan:   Assessment & Plan Coronary artery disease involving native coronary artery of native heart with angina pectoris [facility]) S/p DES to the mid LAD in April 2021 and DES to the proximal RCA in May 2021.  Myoview in April 2024 with inferior and inferolateral infarct but no ischemia.  We have tried to manage her medically.  She has chronic stable angina.  CCS class II.  No significant change since last visit. -Increase Isosorbide to 90mg  daily for angina control. -Continue  Plavix 75mg  daily, Metoprolol succinate 50mg  daily, and Nitroglycerin as needed. HFrEF (heart failure with reduced ejection fraction) (HCC) EF decreased to 30-35% in April 2024 from 50-55% in 2015.  She is unable to tolerate Losartan secondary to leg pain. -Continue Metoprolol succinate 50mg  daily, isosorbide. -Start Jardiance 10mg  daily.  We discussed potential for side effects such as GU infection. -Consider Spironolactone at follow-up. -Plan to reassess EF with echocardiogram once patient is on maximally tolerated GDMT. Moderate to severe mitral regurgitation As noted, she will have follow-up echo once she is on maximally tolerated GDMT.   Claudication of calf muscles (HCC) Patient reports bilateral calf pain with exertion, suggestive of claudication. ABI was normal two years ago. -Arrange for repeat ABI and arterial ultrasound to rule out significant PAD. Essential hypertension Controlled. -Continue Hydrochlorothiazide 25mg  daily, Metoprolol 50mg  daily, and Isosorbide. Mixed hyperlipidemia LDL above goal at 91 on recent lab work. Patient is intolerant of statins. -Continue Repatha 140mg  every two weeks.         Dispo:  Return in about 4 weeks (around 02/17/2023) for Routine Follow Up, w/ Dr. Anne Fu, or Tereso Newcomer, PA-C.  Signed, Tereso Newcomer, PA-C

## 2023-01-20 ENCOUNTER — Encounter: Payer: Self-pay | Admitting: Physician Assistant

## 2023-01-20 ENCOUNTER — Ambulatory Visit: Payer: Medicare Other | Attending: Physician Assistant | Admitting: Physician Assistant

## 2023-01-20 VITALS — BP 130/66 | HR 75 | Ht 64.0 in | Wt 139.0 lb

## 2023-01-20 DIAGNOSIS — I5020 Unspecified systolic (congestive) heart failure: Secondary | ICD-10-CM

## 2023-01-20 DIAGNOSIS — I10 Essential (primary) hypertension: Secondary | ICD-10-CM

## 2023-01-20 DIAGNOSIS — I739 Peripheral vascular disease, unspecified: Secondary | ICD-10-CM | POA: Insufficient documentation

## 2023-01-20 DIAGNOSIS — I25119 Atherosclerotic heart disease of native coronary artery with unspecified angina pectoris: Secondary | ICD-10-CM | POA: Diagnosis not present

## 2023-01-20 DIAGNOSIS — I340 Nonrheumatic mitral (valve) insufficiency: Secondary | ICD-10-CM | POA: Diagnosis not present

## 2023-01-20 DIAGNOSIS — E782 Mixed hyperlipidemia: Secondary | ICD-10-CM

## 2023-01-20 MED ORDER — EMPAGLIFLOZIN 10 MG PO TABS: 10.0000 mg | ORAL_TABLET | Freq: Every day | ORAL | Status: DC

## 2023-01-20 MED ORDER — EMPAGLIFLOZIN 10 MG PO TABS: 10.0000 mg | ORAL_TABLET | Freq: Every day | ORAL | 3 refills | Status: DC

## 2023-01-20 MED ORDER — ISOSORBIDE MONONITRATE ER 60 MG PO TB24: 90.0000 mg | ORAL_TABLET | Freq: Every day | ORAL | 3 refills | Status: DC

## 2023-01-20 NOTE — Assessment & Plan Note (Signed)
S/p DES to the mid LAD in April 2021 and DES to the proximal RCA in May 2021.  Myoview in April 2024 with inferior and inferolateral infarct but no ischemia.  We have tried to manage her medically.  She has chronic stable angina.  CCS class II.  No significant change since last visit. -Increase Isosorbide to 90mg  daily for angina control. -Continue Plavix 75mg  daily, Metoprolol succinate 50mg  daily, and Nitroglycerin as needed.

## 2023-01-20 NOTE — Patient Instructions (Addendum)
Medication Instructions:  Your physician has recommended you make the following change in your medication:   INCREASE Isosorbide to 60 mg taking 1 1/2 tablet daily  START Jardiance 10 mg taking 1 daily   *If you need a refill on your cardiac medications before your next appointment, please call your pharmacy*   Lab Work: 3-4 WEEKS:  BMET  If you have labs (blood work) drawn today and your tests are completely normal, you will receive your results only by: MyChart Message (if you have MyChart) OR A paper copy in the mail If you have any lab test that is abnormal or we need to change your treatment, we will call you to review the results.   Testing/Procedures: Your physician has requested that you have a lower extremity arterial exercise duplex WITH ABI'S. During this test, exercise and ultrasound are used to evaluate arterial blood flow in the legs. Allow one hour for this exam. There are no restrictions or special instructions.    Follow-Up: At [facility], you and your health needs are our priority.  As part of our continuing mission to provide you with exceptional heart care, we have created designated Provider Care Teams.  These Care Teams include your primary Cardiologist (physician) and Advanced Practice Providers (APPs -  Physician Assistants and Nurse Practitioners) who all work together to provide you with the care you need, when you need it.  We recommend signing up for the patient portal called "MyChart".  Sign up information is provided on this After Visit Summary.  MyChart is used to connect with patients for Virtual Visits (Telemedicine).  Patients are able to view lab/test results, encounter notes, upcoming appointments, etc.  Non-urgent messages can be sent to your provider as well.   To learn more about what you can do with MyChart, go to [email or web address].    Your next appointment:   4 WEEKS  Provider:   Donato Schultz, MD  or Tereso Newcomer, PA-C          Other Instructions

## 2023-01-20 NOTE — Assessment & Plan Note (Signed)
Patient reports bilateral calf pain with exertion, suggestive of claudication. ABI was normal two years ago. -Arrange for repeat ABI and arterial ultrasound to rule out significant PAD.

## 2023-01-20 NOTE — Assessment & Plan Note (Signed)
EF decreased to 30-35% in April 2024 from 50-55% in 2015.  She is unable to tolerate Losartan secondary to leg pain. -Continue Metoprolol succinate 50mg  daily, isosorbide. -Start Jardiance 10mg  daily.  We discussed potential for side effects such as GU infection. -Consider Spironolactone at follow-up. -Plan to reassess EF with echocardiogram once patient is on maximally tolerated GDMT.

## 2023-01-20 NOTE — Assessment & Plan Note (Signed)
Controlled. -Continue Hydrochlorothiazide 25mg  daily, Metoprolol 50mg  daily, and Isosorbide.

## 2023-01-20 NOTE — Assessment & Plan Note (Signed)
LDL above goal at 91 on recent lab work. Patient is intolerant of statins. -Continue Repatha 140mg  every two weeks.

## 2023-01-20 NOTE — Assessment & Plan Note (Signed)
As noted, she will have follow-up echo once she is on maximally tolerated GDMT.

## 2023-01-28 ENCOUNTER — Ambulatory Visit (HOSPITAL_BASED_OUTPATIENT_CLINIC_OR_DEPARTMENT_OTHER)
Admission: RE | Admit: 2023-01-28 | Discharge: 2023-01-28 | Disposition: A | Discharge status: Home / Self Care | Payer: Medicare Other | Source: Ambulatory Visit | Attending: Cardiology | Admitting: Cardiology

## 2023-01-28 ENCOUNTER — Encounter (HOSPITAL_COMMUNITY): Payer: Self-pay

## 2023-01-28 ENCOUNTER — Ambulatory Visit (HOSPITAL_COMMUNITY)
Admission: RE | Admit: 2023-01-28 | Discharge: 2023-01-28 | Disposition: A | Discharge status: Home / Self Care | Payer: Medicare Other | Source: Ambulatory Visit | Attending: Cardiology | Admitting: Cardiology

## 2023-01-28 DIAGNOSIS — I739 Peripheral vascular disease, unspecified: Secondary | ICD-10-CM | POA: Diagnosis not present

## 2023-01-29 LAB — VAS US ABI WITH/WO TBI
Left ABI: 0.74
Right ABI: 0.81

## 2023-01-30 ENCOUNTER — Encounter: Payer: Self-pay | Admitting: Physician Assistant

## 2023-01-30 ENCOUNTER — Telehealth: Payer: Self-pay | Admitting: *Deleted

## 2023-01-30 DIAGNOSIS — I739 Peripheral vascular disease, unspecified: Secondary | ICD-10-CM

## 2023-01-30 NOTE — Telephone Encounter (Signed)
Call placed to pt, left a message for her to call back.

## 2023-01-30 NOTE — Telephone Encounter (Signed)
-----   Message from Tereso Newcomer sent at 01/30/2023  8:38 AM EDT ----- Results sent to Kerri Snyder via MyChart. See MyChart comments below. I will send a copy to PCP Katrinka Blazing, Sonny Masters, MD) as Lorain Childes. PLAN:  -Refer to [facility] Allyson Sabal, [place]) for peripheral arterial disease.  Kerri Snyder  Your ABIs and arterial dopplers suggest significant blockages in your legs that may be contributing to your pain. I will refer you to one of our cardiologists that specializes in this type of problem. Tereso Newcomer, PA-C

## 2023-01-31 NOTE — Telephone Encounter (Signed)
Returned call, no answer no machine

## 2023-01-31 NOTE — Telephone Encounter (Signed)
Pt returned my call and she has been made aware of her results.  She is already scheduled to see Dr. Allyson Sabal 02/19/23.

## 2023-01-31 NOTE — Telephone Encounter (Signed)
Pt returning nurses phone call. Please advise ?

## 2023-01-31 NOTE — Telephone Encounter (Signed)
Returned call again, had to leave another message.

## 2023-01-31 NOTE — Telephone Encounter (Signed)
Patient is returning phone call.  °

## 2023-02-03 ENCOUNTER — Ambulatory Visit: Payer: Medicare Other | Admitting: Cardiovascular Disease

## 2023-02-17 ENCOUNTER — Other Ambulatory Visit: Payer: Self-pay | Admitting: *Deleted

## 2023-02-17 ENCOUNTER — Encounter: Payer: Self-pay | Admitting: Physician Assistant

## 2023-02-17 ENCOUNTER — Ambulatory Visit: Payer: Medicare Other | Attending: Physician Assistant | Admitting: Physician Assistant

## 2023-02-17 VITALS — BP 146/76 | HR 70 | Ht 64.0 in | Wt 137.6 lb

## 2023-02-17 DIAGNOSIS — I25119 Atherosclerotic heart disease of native coronary artery with unspecified angina pectoris: Secondary | ICD-10-CM | POA: Diagnosis not present

## 2023-02-17 DIAGNOSIS — I340 Nonrheumatic mitral (valve) insufficiency: Secondary | ICD-10-CM | POA: Diagnosis not present

## 2023-02-17 DIAGNOSIS — I739 Peripheral vascular disease, unspecified: Secondary | ICD-10-CM

## 2023-02-17 DIAGNOSIS — I5020 Unspecified systolic (congestive) heart failure: Secondary | ICD-10-CM

## 2023-02-17 DIAGNOSIS — I10 Essential (primary) hypertension: Secondary | ICD-10-CM

## 2023-02-17 NOTE — Assessment & Plan Note (Signed)
S/p DES to the LAD in April 2021 and DES x 2 to the RCA in May 2020.  Nuclear stress test in April 2024 with inferolateral infarct but no ischemia.  She has chronic stable angina with improved symptoms since last visit. She has not had further chest or left arm pain with exertion.   - Continue Plavix 75 mg daily, Repatha 140 mg every 14 days, Imdur 90 mg daily, metoprolol succinate 50 mg daily, nitroglycerin as needed

## 2023-02-17 NOTE — Assessment & Plan Note (Signed)
Blood pressure elevated today.  Will continue to monitor and adjust therapy for CHF.

## 2023-02-17 NOTE — Progress Notes (Signed)
Cardiology Office Note:    Date:  02/17/2023  ID:  Lynnette Caffey, DOB 1940/09/07, MRN [id number] PCP: Merri Brunette, MD  [facility] Providers Cardiologist:  Donato Schultz, MD       Patient Profile:      Coronary artery disease  S/p orbital atherectomy and 2.5 x 28 mm DES to mLAD in 07/2019 S/p orbital atherectomy and 3.5 x 24 mm DES to pRCA and 3 x 38 mm DES to mRCA in 08/2019 LHC 09/09/19: pLAD 25, mLAD stent patent; LCx irregs; RPAV 75 Chronic stable angina Myoview 08/07/22: inf and inf-lat infarct, no ischemia, EF 38; int risk (HFrEF) heart failure with reduced ejection fraction  TEE 06/17/13: EF 50-55 // LV gram 07/2019: EF 45-50 TTE 08/07/22: inf AK, EF 30-35, Gr 2 DD, NL RVSF, severe LAE, mod to severe MR, trivial AI, RAP 3 Peripheral arterial disease  ABIs/Arterial US 01/29/23: R 0.81, L 0.74 // R SFA/AK Pop 50-74; L BK Pop/Prox Tib-Peroneal Trunk 75-99  Mitral regurgitation TTE 07/2022: mod to severe MR Carotid artery disease S/p bilateral CEAs Carotid US (VVS) 09/16/14: patent bilat CEAs Hx of CVA Hypertension  Hyperlipidemia  Legal blindness Anxiety              History of Present Illness:  Discussed the use of AI scribe software for clinical note transcription with the patient, who gave verbal consent to proceed.  Kerri Snyder is a 82 y.o. female who returns for follow-up of CAD, CHF.  She was last seen 01/20/23.  Her isosorbide dose was adjusted to further manage her chronic stable angina.  I placed her on Jardiance to further advance GDMT for HFrEF.  She complained of leg discomfort and I set up for ABIs and lower extremity arterial Dopplers.  Lower extremity arterial Dopplers demonstrated right SFA and left popliteal artery disease.  She was referred to [facility] cardiology.  She has an appointment with Dr. Allyson Sabal 02/19/2023. She is here alone.  She notes that she was hesitant to take Jardiance due to potential kidney effects and frequent urination. She had previously  stopped taking Losartan due to leg pain.  She is feeling significantly better, with no chest or left arm pain with exertion, no dizziness, and no lightheadedness.  She has not had significant shortness of breath, syncope.     ROS   See HPI     Studies Reviewed:              Risk Assessment/Calculations:     HYPERTENSION CONTROL Vitals:   02/17/23 1456 02/17/23 1741  BP: (!) 142/62 (!) 146/76    The patient's blood pressure is elevated above target today.  In order to address the patient's elevated BP: Blood pressure will be monitored at home to determine if medication changes need to be made.          Physical Exam:   VS:  BP (!) 146/76   Pulse 70   Ht 5\' 4"  (1.626 m)   Wt 137 lb 9.6 oz (62.4 kg)   SpO2 96%   BMI 23.62 kg/m    Wt Readings from Last 3 Encounters:  02/17/23 137 lb 9.6 oz (62.4 kg)  01/20/23 139 lb (63 kg)  12/04/22 133 lb 12.8 oz (60.7 kg)    Constitutional:      Appearance: Healthy appearance. Not in distress.  Neck:     Vascular: No JVR. JVD normal.  Pulmonary:     Breath sounds: Normal breath sounds. No  wheezing. No rales.  Cardiovascular:     Normal rate. Regular rhythm.     Murmurs: There is no murmur.  Edema:    Peripheral edema absent.  Abdominal:     Palpations: Abdomen is soft.        Assessment and Plan:   Assessment & Plan Coronary artery disease involving native coronary artery of native heart with angina pectoris [facility]) S/p DES to the LAD in April 2021 and DES x 2 to the RCA in May 2020.  Nuclear stress test in April 2024 with inferolateral infarct but no ischemia.  She has chronic stable angina with improved symptoms since last visit. She has not had further chest or left arm pain with exertion.   - Continue Plavix 75 mg daily, Repatha 140 mg every 14 days, Imdur 90 mg daily, metoprolol succinate 50 mg daily, nitroglycerin as needed HFrEF (heart failure with reduced ejection fraction) (HCC) EF reduced to 30-35 by echocardiogram  April 2024.  Volume status stable.  NYHA II.  She could not tolerate losartan due to leg pain.  Question if maybe she was experiencing claudication.  We could consider ARB therapy again.  She was hesitant to take Jardiance due to concerns about kidney function.  I tried to explain to her today that SGLT2 inhibitors are renal protective.  We also discussed the 4 pillars of CHF management.  She is willing to try this now. -Continue metoprolol succinate 50 mg daily -Proceed with starting Jardiance 10 mg daily -She knows to contact me if she cannot tolerate -BMET in 2 to 3 weeks -Follow-up 6 weeks -Consider rechallenging with ARB at that time -Plan repeat echo once she is on maximally tolerated GDMT Moderate to severe mitral regurgitation Plan follow-up echo when she is on maximally tolerated GDMT. Peripheral vascular disease (HCC) She has bilateral calf claudication.  Recent lower extremity arterial Dopplers demonstrates significant right SFA disease and left popliteal disease.  She has consultation with Dr. Allyson Sabal later this week. Essential hypertension Blood pressure elevated today.  Will continue to monitor and adjust therapy for CHF.      Dispo:  Return in about 6 weeks (around 03/31/2023) for Routine Follow Up, w/ Dr. Anne Fu, or Tereso Newcomer, PA-C.  Signed, Tereso Newcomer, PA-C

## 2023-02-17 NOTE — Assessment & Plan Note (Signed)
Plan follow-up echo when she is on maximally tolerated GDMT.

## 2023-02-17 NOTE — Assessment & Plan Note (Signed)
EF reduced to 30-35 by echocardiogram April 2024.  Volume status stable.  NYHA II.  She could not tolerate losartan due to leg pain.  Question if maybe she was experiencing claudication.  We could consider ARB therapy again.  She was hesitant to take Jardiance due to concerns about kidney function.  I tried to explain to her today that SGLT2 inhibitors are renal protective.  We also discussed the 4 pillars of CHF management.  She is willing to try this now. -Continue metoprolol succinate 50 mg daily -Proceed with starting Jardiance 10 mg daily -She knows to contact me if she cannot tolerate -BMET in 2 to 3 weeks -Follow-up 6 weeks -Consider rechallenging with ARB at that time -Plan repeat echo once she is on maximally tolerated GDMT

## 2023-02-17 NOTE — Patient Instructions (Signed)
Medication Instructions:  Your physician recommends that you continue on your current medications as directed. Please refer to the Current Medication list given to you today.   START JARDIANCE *If you need a refill on your cardiac medications before your next appointment, please call your pharmacy*   Lab Work: 03/09/24 COME TO THE OFFICE ANYTIME AFTER 7:30 AM:  BMET  If you have labs (blood work) drawn today and your tests are completely normal, you will receive your results only by: MyChart Message (if you have MyChart) OR A paper copy in the mail If you have any lab test that is abnormal or we need to change your treatment, we will call you to review the results.   Testing/Procedures: None ordered   Follow-Up: At [facility], you and your health needs are our priority.  As part of our continuing mission to provide you with exceptional heart care, we have created designated Provider Care Teams.  These Care Teams include your primary Cardiologist (physician) and Advanced Practice Providers (APPs -  Physician Assistants and Nurse Practitioners) who all work together to provide you with the care you need, when you need it.  We recommend signing up for the patient portal called "MyChart".  Sign up information is provided on this After Visit Summary.  MyChart is used to connect with [email or web address] for Virtual Visits (Telemedicine).  Patients are able to view lab/test results, encounter notes, upcoming appointments, etc.  Non-urgent messages can be sent to your provider as well.   To learn more about what you can do with MyChart, go to [email or web address].    Your next appointment:   6 weeks  Provider:   Donato Schultz, MD     Other Instructions

## 2023-02-17 NOTE — Assessment & Plan Note (Signed)
She has bilateral calf claudication.  Recent lower extremity arterial Dopplers demonstrates significant right SFA disease and left popliteal disease.  She has consultation with Dr. Allyson Sabal later this week.

## 2023-02-18 ENCOUNTER — Telehealth: Payer: Self-pay | Admitting: Cardiology

## 2023-02-18 NOTE — Telephone Encounter (Signed)
Patient stated she will need a letter faxed to her workplace stating she had a doctor's visit yesterday (11/4) with Wende Mott.  Letter should be faxed to [employer], Fax# [phone].

## 2023-02-18 NOTE — Telephone Encounter (Signed)
Letter has been faxed per patient's request. Left message for patient letting her know this has been done.

## 2023-02-19 ENCOUNTER — Encounter: Payer: Self-pay | Admitting: Cardiovascular Disease

## 2023-02-19 ENCOUNTER — Ambulatory Visit: Payer: Medicare Other | Attending: Cardiovascular Disease | Admitting: Cardiovascular Disease

## 2023-02-19 VITALS — BP 132/68 | HR 67 | Ht 65.0 in | Wt 135.0 lb

## 2023-02-19 DIAGNOSIS — I25119 Atherosclerotic heart disease of native coronary artery with unspecified angina pectoris: Secondary | ICD-10-CM

## 2023-02-19 DIAGNOSIS — I739 Peripheral vascular disease, unspecified: Secondary | ICD-10-CM

## 2023-02-19 MED ORDER — CILOSTAZOL 50 MG PO TABS: 50.0000 mg | ORAL_TABLET | Freq: Two times a day (BID) | ORAL | 2 refills | Status: DC

## 2023-02-19 NOTE — Progress Notes (Signed)
02/19/2023 BESSIE BOYTE   [birth year]  [id number]  Primary Physician Merri Brunette, MD Primary Cardiologist: Runell Gess MD Nicholes Calamity, [name]  HPI:  MARGORIE RENNER is a 82 y.o. thin-appearing widowed Caucasian female mother of 1 son, grandmother of 3 grandchildren who continues to work at age 34 at the [employer].  She was referred by Tereso Newcomer, PA-C for peripheral vascular evaluation because of lifestyle-limiting claudication.  Her primary cardiologist is Dr. Donato Schultz.  She does have a history of CAD status post LAD and RCA intervention in the past (4, 5/21).  She has heart failure with reduced EF and moderate to severe MR as well as history of hypertension, and hyperlipidemia.  She is legally blind.  She lives with her grandson and gets out of the house rarely.  She does say however that she is limited by claudication left greater than right for several years.  She did have Doppler studies performed 01/28/2023 revealing a right ABI of 0.81, left of 0.74.  She had moderate disease in her right popliteal artery and an occluded right PTA.  She had high-grade left tibioperoneal trunk stenosis and an occluded left posterior tibial artery.  She currently denies chest pain or shortness of breath.   Current Meds  Medication Sig   ALPRAZolam (XANAX) 0.25 MG tablet Take 0.25 mg by mouth 2 (two) times daily.   Ascorbic Acid (VITAMIN C PO) Take 1 tablet by mouth every morning.   Cholecalciferol (VITAMIN D PO) Take 1 tablet by mouth daily.    cilostazol (PLETAL) 50 MG tablet Take 1 tablet (50 mg total) by mouth 2 (two) times daily.   clopidogrel (PLAVIX) 75 MG tablet Take 1 tablet (75 mg total) by mouth daily with breakfast.   empagliflozin (JARDIANCE) 10 MG TABS tablet Take 1 tablet (10 mg total) by mouth daily before breakfast.   Evolocumab (REPATHA SURECLICK) 140 MG/ML SOAJ INJECT 140 MG INTO THE SKIN EVERY 14 (FOURTEEN) DAYS.   hydrochlorothiazide  (HYDRODIURIL) 25 MG tablet Take 25 mg by mouth at bedtime.   hydroxypropyl methylcellulose (ISOPTO TEARS) 2.5 % ophthalmic solution Place 1 drop into both eyes 4 (four) times daily as needed for dry eyes.   isosorbide mononitrate (IMDUR) 60 MG 24 hr tablet Take 1.5 tablets (90 mg total) by mouth daily.   ketoconazole (NIZORAL) 2 % cream 1 application to affected area   metoprolol succinate (TOPROL-XL) 50 MG 24 hr tablet Take 1 tablet (50 mg total) by mouth daily. Take with or immediately following a meal.   nitroGLYCERIN (NITROSTAT) 0.4 MG SL tablet Place 1 tablet (0.4 mg total) under the tongue every 5 (five) minutes as needed for chest pain.   vitamin B-12 (CYANOCOBALAMIN) 1000 MCG tablet Take 1,000 mcg by mouth daily.     Allergies  Allergen Reactions   Pitavastatin     Other Reaction(s): severe leg cramps   Rosuvastatin     Other Reaction(s): leg pain and stomach upset    Social History   Socioeconomic History   Marital status: Married    Spouse name: Not on file   Number of children: Not on file   Years of education: Not on file   Highest education level: Not on file  Occupational History   Not on file  Tobacco Use   Smoking status: Never   Smokeless tobacco: Never  Vaping Use   Vaping status: Never Used  Substance and Sexual Activity  Alcohol use: No    Alcohol/week: 0.0 standard drinks of alcohol   Drug use: No   Sexual activity: Never  Other Topics Concern   Not on file  Social History Narrative   Not on file   Social Determinants of Health   Financial Resource Strain: Not on file  Food Insecurity: Not on file  Transportation Needs: Not on file  Physical Activity: Not on file  Stress: Not on file  Social Connections: Not on file  Intimate Partner Violence: Not on file     Review of Systems: General: negative for chills, fever, night sweats or weight changes.  Cardiovascular: negative for chest pain, dyspnea on exertion, edema, orthopnea, palpitations,  paroxysmal nocturnal dyspnea or shortness of breath Dermatological: negative for rash Respiratory: negative for cough or wheezing Urologic: negative for hematuria Abdominal: negative for nausea, vomiting, diarrhea, bright red blood per rectum, melena, or hematemesis Neurologic: negative for visual changes, syncope, or dizziness All other systems reviewed and are otherwise negative except as noted above.    Blood pressure 132/68, pulse 67, height 5\' 5"  (1.651 m), weight 135 lb (61.2 kg), SpO2 96%.  General appearance: alert and no distress Neck: no adenopathy, no JVD, supple, symmetrical, trachea midline, thyroid not enlarged, symmetric, no tenderness/mass/nodules, and soft bilateral carotid bruits Lungs: clear to auscultation bilaterally Heart: regular rate and rhythm, S1, S2 normal, no murmur, click, rub or gallop Extremities: extremities normal, atraumatic, no cyanosis or edema Pulses: 2+ and symmetric Skin: Skin color, texture, turgor normal. No rashes or lesions Neurologic: Grossly normal  EKG EKG Interpretation Date/Time:  Wednesday February 19 2023 14:51:30 EST Ventricular Rate:  67 PR Interval:  156 QRS Duration:  94 QT Interval:  370 QTC Calculation: 390 R Axis:   9  Text Interpretation: Normal sinus rhythm Inferior infarct , age undetermined When compared with ECG of 04-Dec-2022 14:06, Inferior infarct is now Present T wave inversion more evident in Inferior leads T wave inversion now evident in Lateral leads Confirmed by Nanetta Batty [id number]) on 02/19/2023 2:58:47 PM    ASSESSMENT AND PLAN:   Claudication of calf muscles (HCC) Ms. Hamill was referred to me by Tereso Newcomer, PA-C for symptomatic claudication/PAD.  She does have a history of CAD status post interventions in the past as well as treated hypertension and hyperlipidemia.  She has had claudication left greater than right for several years.  She did have lower extremity arterial Doppler studies performed  01/28/2023 revealing a right ABI of 0.81 and a left of 0.74.  She did have a moderate right popliteal artery stenosis and occluded posterior tibial artery.  She had high-grade left tibioperoneal trunk stenosis and occluded posterior tibial artery.  She is fairly sedentary, lives with her grandson and really does not get out much.  She does continue to work however.  She is legally blind.  I am going to begin her on Pletal initially (50 mg p.o. twice daily), and will see if this affords her clinical benefit.  I will see her back in 3 months.  If she is still having symptomatic claudication we will discuss the possibility of an endovascular solution.     Runell Gess MD FACP,FACC,FAHA, [facility] 02/19/2023 3:10 PM

## 2023-02-19 NOTE — Assessment & Plan Note (Signed)
Kerri Snyder was referred to me by Tereso Newcomer, PA-C for symptomatic claudication/PAD.  She does have a history of CAD status post interventions in the past as well as treated hypertension and hyperlipidemia.  She has had claudication left greater than right for several years.  She did have lower extremity arterial Doppler studies performed 01/28/2023 revealing a right ABI of 0.81 and a left of 0.74.  She did have a moderate right popliteal artery stenosis and occluded posterior tibial artery.  She had high-grade left tibioperoneal trunk stenosis and occluded posterior tibial artery.  She is fairly sedentary, lives with her grandson and really does not get out much.  She does continue to work however.  She is legally blind.  I am going to begin her on Pletal initially (50 mg p.o. twice daily), and will see if this affords her clinical benefit.  I will see her back in 3 months.  If she is still having symptomatic claudication we will discuss the possibility of an endovascular solution.

## 2023-02-19 NOTE — Patient Instructions (Addendum)
Medication Instructions:    50 mg  Pletal ( Cilostazol )  one tablet twice a day   *If you need a refill on your cardiac medications before your next appointment, please call your pharmacy*   Lab Work: Not needed    Testing/Procedures: Not needed   Follow-Up: At [facility], you and your health needs are our priority.  As part of our continuing mission to provide you with exceptional heart care, we have created designated Provider Care Teams.  These Care Teams include your primary Cardiologist (physician) and Advanced Practice Providers (APPs -  Physician Assistants and Nurse Practitioners) who all work together to provide you with the care you need, when you need it.     Your next appointment:   3 month(s)  The format for your next appointment:   In Person  Provider:   Dr Allyson Sabal

## 2023-02-20 ENCOUNTER — Ambulatory Visit: Payer: Medicare Other

## 2023-02-20 ENCOUNTER — Ambulatory Visit: Payer: Medicare Other | Admitting: Cardiology

## 2023-02-20 ENCOUNTER — Telehealth: Payer: Self-pay | Admitting: Cardiovascular Disease

## 2023-02-20 NOTE — Telephone Encounter (Signed)
Patient states she forgot to ask for a doctor's note during 11/06 appointment with Dr. Allyson Sabal. She is requesting to have a note faxed to her employer at [phone] (attn: Mila Merry).

## 2023-02-21 NOTE — Telephone Encounter (Signed)
Attempted to call patient, no answer left message requesting a call back   Front office: If patient returns call please confirm if she needs a letter to excuse her for the 11/6 appointment only?  Thank you!

## 2023-02-21 NOTE — Telephone Encounter (Signed)
Patient identification verified by 2 forms. Marilynn Rail, RN   Received call from patient  Patient states:   -has appointment 11/6  -needs letter sent to employer stating she had the appointment   -send to Attn Mila Merry [phone] Informed patient letter will be faxed to requested location     RN completed letter  Letter faxed per patient request

## 2023-03-10 ENCOUNTER — Ambulatory Visit: Payer: Medicare Other | Attending: Physician Assistant

## 2023-03-11 LAB — BASIC METABOLIC PANEL
BUN/Creatinine Ratio: 22 (ref 12–28)
BUN: 21 mg/dL (ref 8–27)
CO2: 25 mmol/L (ref 20–29)
Calcium: 10.1 mg/dL (ref 8.7–10.3)
Chloride: 99 mmol/L (ref 96–106)
Creatinine, Ser: 0.95 mg/dL (ref 0.57–1.00)
Glucose: 96 mg/dL (ref 70–99)
Potassium: 4.1 mmol/L (ref 3.5–5.2)
Sodium: 139 mmol/L (ref 134–144)
eGFR: 59 mL/min/{1.73_m2} — ABNORMAL LOW (ref >59)

## 2023-03-18 ENCOUNTER — Ambulatory Visit: Payer: Medicare Other | Admitting: Physician Assistant

## 2023-03-20 ENCOUNTER — Encounter: Payer: Self-pay | Admitting: Physician Assistant

## 2023-03-20 NOTE — Telephone Encounter (Signed)
Error

## 2023-03-21 ENCOUNTER — Telehealth: Payer: Self-pay | Admitting: Physician Assistant

## 2023-03-21 NOTE — Progress Notes (Signed)
Pt has been made aware of normal result and verbalized understanding.  jw

## 2023-03-21 NOTE — Telephone Encounter (Signed)
Pt called back on her lunch break and she has been made aware of her normal lab results.

## 2023-03-21 NOTE — Telephone Encounter (Signed)
Patient is returning call to discuss labs. She states she plans to call back around 10:00 AM because she's at work,

## 2023-03-27 ENCOUNTER — Other Ambulatory Visit: Payer: Self-pay

## 2023-03-27 ENCOUNTER — Telehealth: Payer: Self-pay | Admitting: Cardiovascular Disease

## 2023-03-27 MED ORDER — EMPAGLIFLOZIN 10 MG PO TABS: 10.0000 mg | ORAL_TABLET | Freq: Every day | ORAL | 3 refills | Status: DC

## 2023-03-27 NOTE — Telephone Encounter (Signed)
*  STAT* If patient is at the pharmacy, call can be transferred to refill team.   1. Which medications need to be refilled? (please list name of each medication and dose if known)  empagliflozin (JARDIANCE) 10 MG TABS tablet   2. Which pharmacy/location (including street and city if local pharmacy) is medication to be sent to? [id number] - [place], [place] - [address]  3. Do they need a 30 day or 90 day supply?  90 day supply

## 2023-03-31 ENCOUNTER — Encounter: Payer: Self-pay | Admitting: Cardiology

## 2023-03-31 NOTE — Telephone Encounter (Signed)
Error

## 2023-04-07 ENCOUNTER — Ambulatory Visit (HOSPITAL_COMMUNITY)
Admission: EM | Admit: 2023-04-07 | Discharge: 2023-04-07 | Disposition: A | Discharge status: Home / Self Care | Payer: Medicare Other | Attending: Physician Assistant | Admitting: Physician Assistant

## 2023-04-07 ENCOUNTER — Encounter (HOSPITAL_COMMUNITY): Payer: Self-pay

## 2023-04-07 DIAGNOSIS — W19XXXA Unspecified fall, initial encounter: Secondary | ICD-10-CM

## 2023-04-07 DIAGNOSIS — R519 Headache, unspecified: Secondary | ICD-10-CM

## 2023-04-07 DIAGNOSIS — B029 Zoster without complications: Secondary | ICD-10-CM

## 2023-04-07 MED ORDER — VALACYCLOVIR HCL 1 G PO TABS: 1000.0000 mg | ORAL_TABLET | Freq: Two times a day (BID) | ORAL | 0 refills | Status: AC

## 2023-04-07 NOTE — ED Notes (Signed)
Patient brought back to room due to age/reason for visit/heart rate above normal and injury details, providers notified.

## 2023-04-07 NOTE — ED Provider Notes (Signed)
[facility]    CSN: [id number] Arrival date & time: 04/07/23  1720      History   Chief Complaint Chief Complaint  Patient presents with   Fall   Head Injury    HPI Kerri Snyder is a 82 y.o. female.   Patient presents today with a weeklong history of headache.  She reports that on 03/23/2023 she took a misstep while at [facility] and fell forward hitting her head on a concrete area.  She developed a contusion but did not initially have any pain.  Denies any associated loss of consciousness, severe headache, nausea, vomiting.  A few weeks later (approximately week ago) she developed lesions on her forehead with associated headache.  She reports headache pain is rated 6 on a 0-10 pain scale, described as aching, no relieving factors identified.  She has tried Tylenol without improvement.  Denies any fever, nausea, vomiting.  She denies any visual disturbance, ocular pain, lesions involving her eye.  She has had shingles in the past with last episode approximately 30 years ago.  She has not had shingles vaccination.  She denies any focal weakness, dysarthria, nausea, vomiting, dizziness.  This is not the worst headache of her life.    Past Medical History:  Diagnosis Date   Albinism (HCC)    Anxiety disorder, unspecified    Blind    legally blind   Carotid artery occlusion    Cerebrovascular disease, unspecified    Coronary artery disease    DDD (degenerative disc disease), lumbar    Diverticulitis large intestine w/o perforation or abscess w/o bleeding    Exercise-induced angina (HCC)    Family history of anesthesia complication    cousin with nausea and vomiting post anesthesia   First degree hemorrhoids    Hereditary and idiopathic neuropathy, unspecified    Hyperlipidemia    Hypertension    Osteopenia    Peripheral vascular disease (HCC) 02/27/2021   ABIs/Arterial US 01/29/23: R 0.81, L 0.74 // R SFA/AK Pop 50-74; L BK Pop/Prox Tib-Peroneal Trunk 75-99       PFO (patent foramen ovale)    Sciatica    Stable angina (HCC)    Stroke (HCC) 06/2013    Patient Active Problem List   Diagnosis Date Noted   Claudication of calf muscles (HCC) 01/20/2023   HFrEF (heart failure with reduced ejection fraction) (HCC) 08/13/2022   Moderate to severe mitral regurgitation 08/12/2022   Albinism (HCC) 02/27/2021   Anxiety disorder 02/27/2021   Atrial septal defect within oval fossa 02/27/2021   Diverticular disease of colon 02/27/2021   First degree hemorrhoids 02/27/2021   Hereditary and idiopathic neuropathy, unspecified 02/27/2021   Osteopenia 02/27/2021   Peripheral vascular disease (HCC) 02/27/2021   Essential hypertension 02/27/2021   Closed fracture of distal phalanx of finger 09/05/2020   Coronary artery disease involving native coronary artery of native heart with angina pectoris (HCC) 09/09/2019   Nonspecific abnormal electrocardiogram (ECG) (EKG) 08/06/2019   Legally blind 08/06/2019   History of stroke 08/06/2019   Aftercare following surgery of the circulatory system, NEC 08/24/2013   Occlusion and stenosis of carotid artery without mention of cerebral infarction 07/13/2013   Carotid stenosis 06/25/2013   TIA (transient ischemic attack) 06/14/2013   Slurred speech 06/14/2013   Elevated blood pressure 06/14/2013   Mixed hyperlipidemia 06/14/2013   Supratherapeutic INR 06/14/2013   Cerebral infarction (HCC) 06/14/2013    Past Surgical History:  Procedure Laterality Date   CAROTID ENDARTERECTOMY Left  June 25, 2013   cea   CAROTID ENDARTERECTOMY Right August 06, 2013   cea   CESAREAN SECTION     CORONARY ATHERECTOMY N/A 09/09/2019   Procedure: CORONARY ATHERECTOMY;  Surgeon: Corky Crafts, MD;  Location: [facility] INVASIVE CV LAB;  Service: Cardiovascular;  Laterality: N/A;   CORONARY STENT INTERVENTION N/A 08/10/2019   Procedure: CORONARY STENT INTERVENTION;  Surgeon: Corky Crafts, MD;  Location: [facility] INVASIVE CV LAB;  Service:  Cardiovascular;  Laterality: N/A;   CORONARY STENT INTERVENTION  09/09/2019   CORONARY STENT INTERVENTION N/A 09/09/2019   Procedure: CORONARY STENT INTERVENTION;  Surgeon: Corky Crafts, MD;  Location: [facility] INVASIVE CV LAB;  Service: Cardiovascular;  Laterality: N/A;   CORONARY ULTRASOUND/IVUS N/A 08/10/2019   Procedure: Intravascular Ultrasound/IVUS;  Surgeon: Corky Crafts, MD;  Location: [facility] INVASIVE CV LAB;  Service: Cardiovascular;  Laterality: N/A;   CORONARY ULTRASOUND/IVUS N/A 09/09/2019   Procedure: Intravascular Ultrasound/IVUS;  Surgeon: Corky Crafts, MD;  Location: [facility] INVASIVE CV LAB;  Service: Cardiovascular;  Laterality: N/A;   Ear drum replacement 2000 Right 2000   ENDARTERECTOMY Left 06/25/2013   Procedure: ENDARTERECTOMY CAROTID;  Surgeon: Larina Earthly, MD;  Location: [facility] OR;  Service: Vascular;  Laterality: Left;   ENDARTERECTOMY Right 08/06/2013   Procedure:  Carotid Endarterectomy with hemashield patch angioplasty;  Surgeon: Larina Earthly, MD;  Location: [facility] OR;  Service: Vascular;  Laterality: Right;   LEFT HEART CATH AND CORONARY ANGIOGRAPHY N/A 08/10/2019   Procedure: LEFT HEART CATH AND CORONARY ANGIOGRAPHY;  Surgeon: Corky Crafts, MD;  Location: [facility] INVASIVE CV LAB;  Service: Cardiovascular;  Laterality: N/A;   LEFT HEART CATH AND CORONARY ANGIOGRAPHY N/A 09/09/2019   Procedure: LEFT HEART CATH AND CORONARY ANGIOGRAPHY;  Surgeon: Corky Crafts, MD;  Location: [facility] INVASIVE CV LAB;  Service: Cardiovascular;  Laterality: N/A;   TEE WITHOUT CARDIOVERSION N/A 06/17/2013   Procedure: TRANSESOPHAGEAL ECHOCARDIOGRAM (TEE);  Surgeon: Vesta Mixer, MD;  Location: [facility] ENDOSCOPY;  Service: Cardiovascular;  Laterality: N/A;   TONSILLECTOMY     TUBAL LIGATION      OB History   No obstetric history on file.      Home Medications    Prior to Admission medications   Medication Sig Start Date End Date Taking? Authorizing Provider  ALPRAZolam (XANAX) 0.25  MG tablet Take 0.25 mg by mouth 2 (two) times daily. 01/26/21  Yes [provider]  Ascorbic Acid (VITAMIN C PO) Take 1 tablet by mouth every morning.   Yes [provider]  Cholecalciferol (VITAMIN D PO) Take 1 tablet by mouth daily.    Yes [provider]  cilostazol (PLETAL) 50 MG tablet Take 1 tablet (50 mg total) by mouth 2 (two) times daily. 02/19/23  Yes Runell Gess, MD  clopidogrel (PLAVIX) 75 MG tablet Take 1 tablet (75 mg total) by mouth daily with breakfast. 06/17/13  Yes Regalado, Belkys A, MD  empagliflozin (JARDIANCE) 10 MG TABS tablet Take 1 tablet (10 mg total) by mouth daily before breakfast. 03/27/23  Yes Runell Gess, MD  Evolocumab (REPATHA SURECLICK) 140 MG/ML SOAJ INJECT 140 MG INTO THE SKIN EVERY 14 (FOURTEEN) DAYS. 11/04/22  Yes Jake Bathe, MD  hydrochlorothiazide (HYDRODIURIL) 25 MG tablet Take 25 mg by mouth at bedtime.   Yes [provider]  hydroxypropyl methylcellulose (ISOPTO TEARS) 2.5 % ophthalmic solution Place 1 drop into both eyes 4 (four) times daily as needed for dry eyes.   Yes [provider]  isosorbide mononitrate (IMDUR) 60 MG 24 hr tablet Take 1.5 tablets (90 mg total) by mouth daily. 01/20/23 04/20/23 Yes Weaver, Scott T, PA-C  ketoconazole (NIZORAL) 2 % cream 1 application to affected area 12/27/09  Yes [provider]  metoprolol succinate (TOPROL-XL) 50 MG 24 hr tablet Take 1 tablet (50 mg total) by mouth daily. Take with or immediately following a meal. 12/04/22  Yes Weaver, Scott T, PA-C  nitroGLYCERIN (NITROSTAT) 0.4 MG SL tablet Place 1 tablet (0.4 mg total) under the tongue every 5 (five) minutes as needed for chest pain. 07/04/22  Yes Jake Bathe, MD  valACYclovir (VALTREX) 1000 MG tablet Take 1 tablet (1,000 mg total) by mouth 2 (two) times daily for 7 days. 04/07/23 04/14/23 Yes Jahmeer Porche K, PA-C  vitamin B-12 (CYANOCOBALAMIN) 1000 MCG tablet Take 1,000 mcg by mouth daily.   Yes  [provider]    Family History Family History  Problem Relation Age of Onset   Diabetes Father    Heart disease Father        Before age 58   Alcohol abuse Father    Alzheimer's disease Mother     Social History Social History   Tobacco Use   Smoking status: Never   Smokeless tobacco: Never  Vaping Use   Vaping status: Never Used  Substance Use Topics   Alcohol use: No    Alcohol/week: 0.0 standard drinks of alcohol   Drug use: No     Allergies   Pitavastatin and Rosuvastatin   Review of Systems Review of Systems  Constitutional:  Positive for activity change. Negative for appetite change, fatigue and fever.  Eyes:  Negative for visual disturbance.  Respiratory:  Negative for cough and shortness of breath.   Cardiovascular:  Negative for chest pain.  Gastrointestinal:  Negative for abdominal pain, diarrhea, nausea and vomiting.  Musculoskeletal:  Negative for arthralgias and myalgias.  Skin:  Positive for rash.  Neurological:  Positive for headaches. Negative for dizziness, seizures, syncope, facial asymmetry, speech difficulty, weakness, light-headedness and numbness.     Physical Exam Triage Vital Signs ED Triage Vitals  Encounter Vitals Group     BP 04/07/23 1730 134/73     Systolic BP Percentile --      Diastolic BP Percentile --      Pulse Rate 04/07/23 1730 (!) 102     Resp 04/07/23 1730 18     Temp 04/07/23 1730 98.2 F (36.8 C)     Temp Source 04/07/23 1730 Oral     SpO2 04/07/23 1730 98 %     Weight 04/07/23 1730 133 lb (60.3 kg)     Height 04/07/23 1730 5\' 5"  (1.651 m)     Head Circumference --      Peak Flow --      Pain Score 04/07/23 1728 7     Pain Loc --      Pain Education --      Exclude from Growth Chart --    No data found.  Updated Vital Signs BP 134/73 (BP Location: Right Arm)   Pulse 92   Temp 98.2 F (36.8 C) (Oral)   Resp 18   Ht 5\' 5"  (1.651 m)   Wt 133 lb (60.3 kg)   SpO2 98%   BMI 22.13 kg/m    Visual Acuity Right Eye Distance:   Left Eye Distance:   Bilateral Distance:    Right Eye Near:   Left Eye Near:  Bilateral Near:     Physical Exam Vitals reviewed.  Constitutional:      General: She is awake. She is not in acute distress.    Appearance: Normal appearance. She is well-developed. She is not ill-appearing.     Comments: Very pleasant female appears stated age in no acute distress sitting comfortably in exam room  HENT:     Head: Normocephalic and atraumatic. No raccoon eyes, Battle's sign or contusion.      Right Ear: Tympanic membrane, ear canal and external ear normal. No hemotympanum.     Left Ear: Tympanic membrane, ear canal and external ear normal. No hemotympanum.     Mouth/Throat:     Tongue: Tongue does not deviate from midline.     Pharynx: Uvula midline. No oropharyngeal exudate or posterior oropharyngeal erythema.  Eyes:     Extraocular Movements: Extraocular movements intact.     Right eye: Nystagmus present.     Left eye: Nystagmus present.     Pupils: Pupils are equal, round, and reactive to light.  Cardiovascular:     Rate and Rhythm: Normal rate and regular rhythm.     Heart sounds: Normal heart sounds, S1 normal and S2 normal. No murmur heard. Pulmonary:     Effort: Pulmonary effort is normal.     Breath sounds: Normal breath sounds. No wheezing, rhonchi or rales.     Comments: Clear to auscultation bilaterally Abdominal:     Palpations: Abdomen is soft.     Tenderness: There is no abdominal tenderness.  Musculoskeletal:     Cervical back: Normal range of motion and neck supple.     Comments: Strength 5/5 bilateral upper and lower extremities  Lymphadenopathy:     Head:     Right side of head: No submental, submandibular or tonsillar adenopathy.     Left side of head: No submental, submandibular or tonsillar adenopathy.  Skin:    Findings: Rash present. Rash is vesicular.     Comments: Vesicular rash noted right V1 distribution  with no ocular involvement.  Several crusted lesions noted in scalp in same distribution.  Neurological:     General: No focal deficit present.     Mental Status: She is alert and oriented to person, place, and time.     Cranial Nerves: Cranial nerves 2-12 are intact.     Motor: Motor function is intact.     Coordination: Coordination is intact.     Gait: Gait is intact.     Comments: Cranial nerves II through XII grossly intact.  No focal neurological defect on exam.  Psychiatric:        Behavior: Behavior is cooperative.      UC Treatments / Results  Labs (all labs ordered are listed, but only abnormal results are displayed) Labs Reviewed - No data to display  EKG   Radiology No results found.  Procedures Procedures (including critical care time)  Medications Ordered in UC Medications - No data to display  Initial Impression / Assessment and Plan / UC Course  I have reviewed the triage vital signs and the nursing notes.  Pertinent labs & imaging results that were available during my care of the patient were reviewed by me and considered in my medical decision making (see chart for details).     Patient is well-appearing, afebrile, nontoxic.  She was initially noted to be tachycardic based on intake vitals, however, upon auscultation she did not sound to be tachycardic into history of repeated and  she was noted to have a normal rate.  We did discuss that based on her history of blood thinner use (Plavix) and age she is a candidate for head CT based on [place] rules, however, given her head injury was several weeks ago and she did not have discomfort for over a week after the initial incident she declined going to the emergency room.  It appears she has shingles based on rash.  There is no involvement of the eye or eyelid.  Given that her symptoms have been ongoing for a week we will start valacyclovir.  Her metabolic panel from 03/10/2023 showed creatinine of 0.95 with  calculated creatinine clearance of 41.98 mL/min so medication was adjusted to 1000 mg twice daily instead of 3 times daily.  She can continue Tylenol for pain relief.  We discussed at length that if there is any involvement of her eye or spread of rash she would need to go to the emergency room for further evaluation and management.  We also discussed that if at any point her headache worsens and she has the worst headache of her life, visual disturbance, dizziness, focal weakness, nausea, vomiting she must go to the ER immediately.  We again discussed that the safest thing would be to go to the emergency room as it is possible to have delayed intracranial hemorrhage but she did not believe her symptoms were severe enough to warrant this and so declined ER evaluation today.  Recommend close follow-up with her primary care.  Final Clinical Impressions(s) / UC Diagnoses   Final diagnoses:  Herpes zoster without complication  Nonintractable headache, unspecified chronicity pattern, unspecified headache type  Fall, initial encounter     Discharge Instructions      As we discussed, the safest thing to do would be to go to the emergency room.  If at any point anything worsens and you have severe headache, worsening of your life, nausea, vomiting, weakness, spread of rash towards her eye, visual change you need to go to the emergency room immediately.  Please follow-up with your primary care next week.  Start valacyclovir twice daily for 1 week.  Use Tylenol for pain relief.  Have a low threshold for going to the emergency room as we discussed.     ED Prescriptions     Medication Sig Dispense Auth. Provider   valACYclovir (VALTREX) 1000 MG tablet Take 1 tablet (1,000 mg total) by mouth 2 (two) times daily for 7 days. 14 tablet Apryle Stowell, Noberto Retort, PA-C      PDMP not reviewed this encounter.   Jeani Hawking, PA-C 04/07/23 1859

## 2023-04-07 NOTE — ED Notes (Addendum)
This RN stressed the importance of pt going to the ER if there are in any changes in condition. Pt verbalized understanding.

## 2023-04-07 NOTE — Discharge Instructions (Signed)
As we discussed, the safest thing to do would be to go to the emergency room.  If at any point anything worsens and you have severe headache, worsening of your life, nausea, vomiting, weakness, spread of rash towards her eye, visual change you need to go to the emergency room immediately.  Please follow-up with your primary care next week.  Start valacyclovir twice daily for 1 week.  Use Tylenol for pain relief.  Have a low threshold for going to the emergency room as we discussed.

## 2023-04-07 NOTE — ED Triage Notes (Signed)
Patient states while at [facility] she missed a step, fell and hit a brick wall on the right side of the face. This happened 03/23/23 but the pain in her head recently started 2 weeks ago. Having pain in  the left eye/forehead area going through to the back of the head.   No notable vision changes, no dizziness, no nausea/vomiting. Patient is AO x4.

## 2023-04-13 NOTE — Progress Notes (Unsigned)
Cardiology Office Note:    Date:  04/14/2023  ID:  Kerri Snyder, DOB Dec 10, 1940, MRN [id number] PCP: Merri Brunette, MD  [facility] Providers Cardiologist:  Donato Schultz, MD PV Cardiologist:  Nanetta Batty, MD       Patient Profile:      Coronary artery disease  S/p orbital atherectomy and 2.5 x 28 mm DES to mLAD in 07/2019 S/p orbital atherectomy and 3.5 x 24 mm DES to pRCA and 3 x 38 mm DES to mRCA in 08/2019 LHC 09/09/19: pLAD 25, mLAD stent patent; LCx irregs; RPAV 75 Chronic stable angina Myoview 08/07/22: inf and inf-lat infarct, no ischemia, EF 38; int risk (HFrEF) heart failure with reduced ejection fraction  TEE 06/17/13: EF 50-55 // LV gram 07/2019: EF 45-50 TTE 08/07/22: inf AK, EF 30-35, Gr 2 DD, NL RVSF, severe LAE, mod to severe MR, trivial AI, RAP 3 Peripheral arterial disease  ABIs/Arterial US 01/29/23: R 0.81, L 0.74 // R SFA/AK Pop 50-74; L BK Pop/Prox Tib-Peroneal Trunk 75-99  Mitral regurgitation TTE 07/2022: mod to severe MR Carotid artery disease S/p bilateral CEAs Carotid US (VVS) 09/16/14: patent bilat CEAs Hx of CVA Hypertension  Hyperlipidemia  Legal blindness Anxiety              History of Present Illness:  Discussed the use of AI scribe software for clinical note transcription with the patient, who gave verbal consent to proceed.  Kerri Snyder is a 82 y.o. female who returns for follow-up of CHF, CAD.  She was last seen in November 2024.  She agreed to start Jardiance at that time.  She returns with an eye towards rechallenging with ARB therapy (she had stopped this in the past secondary to leg pain).  She saw Dr. Gery Pray for [facility] consultation in November 2024 as well.  She was placed on Cilostazol with plans for 61-month follow-up. She is here alone. She has a recent history of shingles following a fall. The patient reported a fall during a trip to [facility], where she hit a concrete wall. Approximately a week after the fall, she developed  shingles. She was prescribed antiviral medication, which she has nearly completed, and reported that the shingles appear to be clearing up, with minimal pain. The patient also reported discontinuing Jardiance due to the cost and a reluctance to take new medications. She notes fatigue and weakness, particularly after physical exertion such as walking and lifting at work. She noted that by lunchtime, she often feels tired. She has not experienced arm pain (anginal equivalent) recently but has taken nitroglycerin twice in the past several months due to shortness of breath after exertion. The patient also reported episodes of heartburn, which seemed to increase after the onset of shingles but have since decreased. The heartburn was relieved by taking Tums. She reported that her legs do not hurt as much as they used since starting Cilostazol.      ROS   See HPI     Studies Reviewed:              Risk Assessment/Calculations:             Physical Exam:   VS:  BP 131/60   Pulse 86   Ht 5\' 5"  (1.651 m)   Wt 131 lb 3.2 oz (59.5 kg)   SpO2 98%   BMI 21.83 kg/m    Wt Readings from Last 3 Encounters:  04/14/23 131 lb 3.2 oz (59.5 kg)  04/07/23 133 lb (60.3 kg)  02/19/23 135 lb (61.2 kg)    Constitutional:      Appearance: Healthy appearance. Not in distress.  Pulmonary:     Breath sounds: Normal breath sounds. No wheezing. No rales.  Cardiovascular:     Normal rate. Regular rhythm.     Murmurs: There is no murmur.  Edema:    Peripheral edema absent.  Skin:    General: Skin is warm and dry.     Comments: Rash on R forehead with scab formation (c/w Zoster)        Assessment and Plan:   Assessment & Plan HFrEF (heart failure with reduced ejection fraction) (HCC) EF 30-35 on TTE in 07/2022. NYHA II. Volume status stable. GDMT: Metoprolol succinate. She cannot afford SGLT2 inhibitors. She stopped ARB in the past due to leg pain. She is willing to try ARB again. -Continue metoprolol  succinate 50 mg daily, Imdur 90 mg daily -Start losartan 12.5 mg daily -BMET 1 month -Follow-up echocardiogram in 3 to 4 months with OV after Coronary artery disease involving native coronary artery of native heart with angina pectoris [facility]) S/p DES to the LAD in April 2021 and DES x 2 to the RCA in May 2020.  Nuclear stress test in April 2024 with inferolateral infarct but no ischemia.  She has chronic stable angina.  This is overall well-controlled on current therapy. -Continue Plavix 75 mg daily, Repatha 140 mg every 14 days, Imdur 90 mg daily, metoprolol succinate 50 mg daily, nitroglycerin as needed Peripheral vascular disease (HCC) She was evaluated by Dr. Allyson Sabal in 02/2023 started on Cilostazol. She is to follow up in 3 mos. The plan is to proceed with angiography if she continues to have claudication despite Med Rx.  Overall, claudication symptoms seem to be improved.  Follow-up with Dr. Gery Pray as planned. Moderate to severe mitral regurgitation Arrange follow-up echo in 3 to 4 months. Essential hypertension Blood pressure well-controlled.  Continue HCTZ 25 mg daily, Imdur 90 mg daily, Toprol succinate 50 mg daily.  Add losartan 12.5 mg daily for GDMT of CHF.      Dispo:  Return in about 4 months (around 08/13/2023) for Follow up after testing, w/ Dr. Anne Fu.  Signed, Tereso Newcomer, PA-C

## 2023-04-14 ENCOUNTER — Other Ambulatory Visit: Payer: Self-pay | Admitting: *Deleted

## 2023-04-14 ENCOUNTER — Ambulatory Visit: Payer: Medicare Other | Attending: Physician Assistant | Admitting: Physician Assistant

## 2023-04-14 ENCOUNTER — Encounter: Payer: Self-pay | Admitting: Physician Assistant

## 2023-04-14 VITALS — BP 131/60 | HR 86 | Ht 65.0 in | Wt 131.2 lb

## 2023-04-14 DIAGNOSIS — I5020 Unspecified systolic (congestive) heart failure: Secondary | ICD-10-CM

## 2023-04-14 DIAGNOSIS — I739 Peripheral vascular disease, unspecified: Secondary | ICD-10-CM

## 2023-04-14 DIAGNOSIS — I25119 Atherosclerotic heart disease of native coronary artery with unspecified angina pectoris: Secondary | ICD-10-CM | POA: Diagnosis not present

## 2023-04-14 DIAGNOSIS — I340 Nonrheumatic mitral (valve) insufficiency: Secondary | ICD-10-CM

## 2023-04-14 DIAGNOSIS — I10 Essential (primary) hypertension: Secondary | ICD-10-CM

## 2023-04-14 MED ORDER — LOSARTAN POTASSIUM 25 MG PO TABS: 12.5000 mg | ORAL_TABLET | Freq: Every day | ORAL | 3 refills | Status: DC

## 2023-04-14 NOTE — Assessment & Plan Note (Addendum)
EF 30-35 on TTE in 07/2022. NYHA II. Volume status stable. GDMT: Metoprolol succinate. She cannot afford SGLT2 inhibitors. She stopped ARB in the past due to leg pain. She is willing to try ARB again. -Continue metoprolol succinate 50 mg daily, Imdur 90 mg daily -Start losartan 12.5 mg daily -BMET 1 month -Follow-up echocardiogram in 3 to 4 months with OV after

## 2023-04-14 NOTE — Assessment & Plan Note (Signed)
Blood pressure well-controlled.  Continue HCTZ 25 mg daily, Imdur 90 mg daily, Toprol succinate 50 mg daily.  Add losartan 12.5 mg daily for GDMT of CHF.

## 2023-04-14 NOTE — Assessment & Plan Note (Addendum)
She was evaluated by Dr. Allyson Sabal in 02/2023 started on Cilostazol. She is to follow up in 3 mos. The plan is to proceed with angiography if she continues to have claudication despite Med Rx.  Overall, claudication symptoms seem to be improved.  Follow-up with Dr. Gery Pray as planned.

## 2023-04-14 NOTE — Assessment & Plan Note (Signed)
Arrange follow-up echo in 3 to 4 months.

## 2023-04-14 NOTE — Patient Instructions (Signed)
Medication Instructions:  Your physician has recommended you make the following change in your medication:  START Losartan 25 taking ONLY 1/2 tablet daily  *If you need a refill on your cardiac medications before your next appointment, please call your pharmacy*   Lab Work: 1 MONTH, GO TO [facility] FOR:  BMET  If you have labs (blood work) drawn today and your tests are completely normal, you will receive your results only by: MyChart Message (if you have MyChart) OR A paper copy in the mail If you have any lab test that is abnormal or we need to change your treatment, we will call you to review the results.   Testing/Procedures: Your physician has requested that you have an echocardiogram BEFORE APPT WITH DR. Anne Fu IN [place]. Echocardiography is a painless test that uses sound waves to create images of your heart. It provides your doctor with information about the size and shape of your heart and how well your heart's chambers and valves are working. This procedure takes approximately one hour. There are no restrictions for this procedure. Please do NOT wear cologne, perfume, aftershave, or lotions (deodorant is allowed). Please arrive 15 minutes prior to your appointment time.  Please note: We ask at that you not bring children with you during ultrasound (echo/ vascular) testing. Due to room size and safety concerns, children are not allowed in the ultrasound rooms during exams. Our front office staff cannot provide observation of children in our lobby area while testing is being conducted. An adult accompanying a patient to their appointment will only be allowed in the ultrasound room at the discretion of the ultrasound technician under special circumstances. We apologize for any inconvenience.    Follow-Up: At [facility], you and your health needs are our priority.  As part of our continuing mission to provide you with exceptional heart care, we have created designated Provider  Care Teams.  These Care Teams include your primary Cardiologist (physician) and Advanced Practice Providers (APPs -  Physician Assistants and Nurse Practitioners) who all work together to provide you with the care you need, when you need it.  We recommend signing up for the patient portal called "MyChart".  Sign up information is provided on this After Visit Summary.  MyChart is used to connect with patients for Virtual Visits (Telemedicine).  Patients are able to view lab/test results, encounter notes, upcoming appointments, etc.  Non-urgent messages can be sent to your provider as well.   To learn more about what you can do with MyChart, go to [email or web address].    Your next appointment:   4 month(s)  Provider:   Donato Schultz, MD     Other Instructions

## 2023-04-14 NOTE — Assessment & Plan Note (Addendum)
S/p DES to the LAD in April 2021 and DES x 2 to the RCA in May 2020.  Nuclear stress test in April 2024 with inferolateral infarct but no ischemia.  She has chronic stable angina.  This is overall well-controlled on current therapy. -Continue Plavix 75 mg daily, Repatha 140 mg every 14 days, Imdur 90 mg daily, metoprolol succinate 50 mg daily, nitroglycerin as needed

## 2023-04-28 ENCOUNTER — Telehealth: Payer: Self-pay | Admitting: Physician Assistant

## 2023-04-28 NOTE — Telephone Encounter (Signed)
 Pt

## 2023-04-29 ENCOUNTER — Telehealth: Payer: Self-pay | Admitting: Pharmacist Clinician (PhC)/ Clinical Pharmacy Specialist

## 2023-04-29 NOTE — Telephone Encounter (Signed)
 [employer] approved to 03/28/2024  ID         [id number] BIN       [id number] PCN     [id number] GRP     [id number]

## 2023-05-15 LAB — BASIC METABOLIC PANEL
BUN/Creatinine Ratio: 37 — ABNORMAL HIGH (ref 12–28)
BUN: 31 mg/dL — ABNORMAL HIGH (ref 8–27)
CO2: 22 mmol/L (ref 20–29)
Calcium: 10.2 mg/dL (ref 8.7–10.3)
Chloride: 103 mmol/L (ref 96–106)
Creatinine, Ser: 0.83 mg/dL (ref 0.57–1.00)
Glucose: 95 mg/dL (ref 70–99)
Potassium: 5.1 mmol/L (ref 3.5–5.2)
Sodium: 140 mmol/L (ref 134–144)
eGFR: 69 mL/min/{1.73_m2} (ref >59)

## 2023-05-20 ENCOUNTER — Telehealth: Payer: Self-pay | Admitting: Physician Assistant

## 2023-05-20 NOTE — Telephone Encounter (Signed)
 Left voicemail to return call to office

## 2023-05-20 NOTE — Telephone Encounter (Signed)
Pt returning call to nurse for results

## 2023-05-21 ENCOUNTER — Telehealth: Payer: Self-pay | Admitting: Cardiology

## 2023-05-21 ENCOUNTER — Ambulatory Visit: Payer: Medicare Other | Attending: Cardiovascular Disease | Admitting: Cardiovascular Disease

## 2023-05-21 ENCOUNTER — Encounter: Payer: Self-pay | Admitting: Cardiovascular Disease

## 2023-05-21 VITALS — BP 114/60 | HR 81 | Ht 65.0 in | Wt 138.8 lb

## 2023-05-21 DIAGNOSIS — I739 Peripheral vascular disease, unspecified: Secondary | ICD-10-CM | POA: Diagnosis not present

## 2023-05-21 NOTE — Assessment & Plan Note (Signed)
 History of symptomatic claudication with Dopplers performed 01/28/2023 showing moderate right popliteal artery stenosis and high-grade left tibioperoneal trunk stenosis.  I did try 3 months of Pletal  which provided minimal clinical benefit.  Kerri Snyder wishes to proceed with outpatient endovascular therapy for lifestyle-limiting claudication.

## 2023-05-21 NOTE — Telephone Encounter (Signed)
 The patient has been notified of the result and verbalized understanding.  All questions (if any) were answered. Isobel Marie, RN 05/21/2023 5:11 PM

## 2023-05-21 NOTE — Telephone Encounter (Signed)
 Patient returned staff call regarding results.

## 2023-05-21 NOTE — Progress Notes (Signed)
 05/21/2023 Kerri Snyder   12-04-1940  [id number]  Primary Physician Claudene Pellet, MD Primary Cardiologist: Dorn [name] [name], [name]  HPI:  [name] is a 83 y.o.  Kerri Snyder thin-appearing widowed Caucasian female mother of 1 son, grandmother of 3 grandchildren who continues to work at age 49 at the [employer] .  She was referred by Glendia Ferrier, PA-C for peripheral vascular evaluation because of lifestyle-limiting claudication.  Her primary cardiologist is Dr. Oneil Parchment.  She does have a history of CAD status post LAD and RCA intervention in the past (4, 5/21).  She has heart failure with reduced EF and moderate to severe MR as well as history of hypertension, and hyperlipidemia.  She is legally blind.  She lives with her grandson and gets out of the house rarely.  She does say however that she is limited by claudication left greater than right for several years.  She did have Doppler studies performed 01/28/2023 revealing a right ABI of 0.81, left of 0.74.  She had moderate disease in her right popliteal artery and an occluded right PTA.  She had high-grade left tibioperoneal trunk stenosis and an occluded left posterior tibial artery.  Since I saw her in the office 3 months ago I did begin her on Pletal  which offered her minimal clinical benefit.  She still symptomatic at work.  She wishes to proceed with outpatient endovascular therapy.  Current Meds  Medication Sig   ALPRAZolam  (XANAX ) 0.25 MG tablet Take 0.25 mg by mouth 2 (two) times daily.   Ascorbic Acid (VITAMIN C PO) Take 1 tablet by mouth every morning.   Cholecalciferol (VITAMIN D PO) Take 1 tablet by mouth daily.    cilostazol  (PLETAL ) 50 MG tablet Take 1 tablet (50 mg total) by mouth 2 (two) times daily.   clopidogrel  (PLAVIX ) 75 MG tablet Take 1 tablet (75 mg total) by mouth daily with breakfast.   Evolocumab  (REPATHA  SURECLICK) 140 MG/ML SOAJ INJECT 140 MG INTO THE SKIN EVERY 14  (FOURTEEN) DAYS.   hydrochlorothiazide  (HYDRODIURIL ) 25 MG tablet Take 25 mg by mouth at bedtime.   hydroxypropyl methylcellulose (ISOPTO TEARS) 2.5 % ophthalmic solution Place 1 drop into both eyes 4 (four) times daily as needed for dry eyes.   ketoconazole (NIZORAL) 2 % cream 1 application to affected area   losartan  (COZAAR ) 25 MG tablet Take 0.5 tablets (12.5 mg total) by mouth daily.   metoprolol  succinate (TOPROL -XL) 50 MG 24 hr tablet Take 1 tablet (50 mg total) by mouth daily. Take with or immediately following a meal.   nitroGLYCERIN  (NITROSTAT ) 0.4 MG SL tablet Place 1 tablet (0.4 mg total) under the tongue every 5 (five) minutes as needed for chest pain.   vitamin B-12 (CYANOCOBALAMIN) 1000 MCG tablet Take 1,000 mcg by mouth daily.     Allergies  Allergen Reactions   Pitavastatin     Other Reaction(s): severe leg cramps   Rosuvastatin      Other Reaction(s): leg pain and stomach upset    Social History   Socioeconomic History   Marital status: Married    Spouse name: Not on file   Number of children: Not on file   Years of education: Not on file   Highest education level: Not on file  Occupational History   Not on file  Tobacco Use   Smoking status: Never   Smokeless tobacco: Never  Vaping Use   Vaping status: Never  Used  Substance and Sexual Activity   Alcohol use: No    Alcohol/week: 0.0 standard drinks of alcohol   Drug use: No   Sexual activity: Never  Other Topics Concern   Not on file  Social History Narrative   Not on file   Social Drivers of Health   Financial Resource Strain: Not on file  Food Insecurity: Not on file  Transportation Needs: Not on file  Physical Activity: Not on file  Stress: Not on file  Social Connections: Not on file  Intimate Partner Violence: Not on file     Review of Systems: General: negative for chills, fever, night sweats or weight changes.  Cardiovascular: negative for chest pain, dyspnea on exertion, edema,  orthopnea, palpitations, paroxysmal nocturnal dyspnea or shortness of breath Dermatological: negative for rash Respiratory: negative for cough or wheezing Urologic: negative for hematuria Abdominal: negative for nausea, vomiting, diarrhea, bright red blood per rectum, melena, or hematemesis Neurologic: negative for visual changes, syncope, or dizziness All other systems reviewed and are otherwise negative except as noted above.    Blood pressure 114/60, pulse 81, height 5' 5 (1.651 m), weight 138 lb 12.8 oz (63 kg), SpO2 97%.  General appearance: alert and no distress Neck: no adenopathy, no carotid bruit, no JVD, supple, symmetrical, trachea midline, and thyroid not enlarged, symmetric, no tenderness/mass/nodules Lungs: clear to auscultation bilaterally Heart: regular rate and rhythm, S1, S2 normal, no murmur, click, rub or gallop Extremities: extremities normal, atraumatic, no cyanosis or edema Pulses: 2+ and symmetric Skin: Skin color, texture, turgor normal. No rashes or lesions Neurologic: Grossly normal  EKG not performed today      ASSESSMENT AND PLAN:   Peripheral vascular disease (HCC) History of symptomatic claudication with Dopplers performed 01/28/2023 showing moderate right popliteal artery stenosis and high-grade left tibioperoneal trunk stenosis.  I did try 3 months of Pletal  which provided minimal clinical benefit.  She wishes to proceed with outpatient endovascular therapy for lifestyle-limiting claudication.     Dorn DOROTHA Lesches MD FACP,FACC,FAHA, [facility] 05/21/2023 3:01 PM

## 2023-05-21 NOTE — Patient Instructions (Addendum)
 Medication Instructions:  Your physician has recommended you make the following change in your medication:   -Stop taking cilotazol (Pletal ).  *If you need a refill on your cardiac medications before your next appointment, please call your pharmacy*   Lab Work: Your physician recommends that you return for lab work in: after 2/20- BMET & CBC  If you have labs (blood work) drawn today and your tests are completely normal, you will receive your results only by: MyChart Message (if you have MyChart) OR A paper copy in the mail If you have any lab test that is abnormal or we need to change your treatment, we will call you to review the results.   Testing/Procedures: Your physician has requested that you have a lower extremity arterial duplex. During this test, ultrasound is used to evaluate arterial blood flow in the legs. Allow one hour for this exam. There are no restrictions or special instructions. This will take place at [address], [address]. To be done 1-2 weeks after your procedure (3/20).  Please note: We ask at that you not bring children with you during ultrasound (echo/ vascular) testing. Due to room size and safety concerns, children are not allowed in the ultrasound rooms during exams. Our front office staff cannot provide observation of children in our lobby area while testing is being conducted. An adult accompanying a patient to their appointment will only be allowed in the ultrasound room at the discretion of the ultrasound technician under special circumstances. We apologize for any inconvenience.  Your physician has requested that you have an ankle brachial index (ABI). During this test an ultrasound and blood pressure cuff are used to evaluate the arteries that supply the arms and legs with blood. Allow thirty minutes for this exam. There are no restrictions or special instructions. This will take place at [address].  To be done 1-2 weeks after your  procedure (3/20).   Please note: We ask at that you not bring children with you during ultrasound (echo/ vascular) testing. Due to room size and safety concerns, children are not allowed in the ultrasound rooms during exams. Our front office staff cannot provide observation of children in our lobby area while testing is being conducted. An adult accompanying a patient to their appointment will only be allowed in the ultrasound room at the discretion of the ultrasound technician under special circumstances. We apologize for any inconvenience.    Follow-Up: At [facility], you and your health needs are our priority.  As part of our continuing mission to provide you with exceptional heart care, we have created designated Provider Care Teams.  These Care Teams include your primary Cardiologist (physician) and Advanced Practice Providers (APPs -  Physician Assistants and Nurse Practitioners) who all work together to provide you with the care you need, when you need it.  We recommend signing up for the patient portal called MyChart.  Sign up information is provided on this After Visit Summary.  MyChart is used to connect with patients for Virtual Visits (Telemedicine).  Patients are able to view lab/test results, encounter notes, upcoming appointments, etc.  Non-urgent messages can be sent to your provider as well.   To learn more about what you can do with MyChart, go to [email or web address].    Your next appointment:   2-3 week(s) after your procedure (3/20)  Provider:   Dorn Lesches, MD      Other Instructions       Cardiac/Peripheral Catheterization  You are scheduled for a Peripheral Angiogram on Thursday, March 20 with Dr. Dorn Lesches.  1. Please arrive at the [facility] (Main Entrance A) at [facility]: [address] [place], [place] [address] at 5:30 AM (This time is 2 hour(s) before your procedure to ensure your preparation).   Free valet parking  service is available. You will check in at ADMITTING. The support person will be asked to wait in the waiting room.  It is OK to have someone drop you off and come back when you are ready to be discharged.        Special note: Every effort is made to have your procedure done on time. Please understand that emergencies sometimes delay scheduled procedures.  2. Diet: Do not eat solid foods after midnight.  You may have clear liquids until 5 AM the day of the procedure.  3. Labs: You will need to have blood drawn after 2/20- BMET & CBC    4. Medication instructions in preparation for your procedure:    Stop taking, HTCZ (Hydrochlorothiazide ) Thursday, March 20,    On the morning of your procedure, take Aspirin  81 mg and Plavix /Clopidogrel  and any morning medicines NOT listed above.  You may use sips of water .  5. Plan to go home the same day, you will only stay overnight if medically necessary. 6. You MUST have a responsible adult to drive you home. 7. An adult MUST be with you the first 24 hours after you arrive home. 8. Bring a current list of your medications, and the last time and date medication taken. 9. Bring ID and current insurance cards. 10.Please wear clothes that are easy to get on and off and wear slip-on shoes.  Thank you for allowing us  to care for you!   -- [facility]

## 2023-05-29 ENCOUNTER — Other Ambulatory Visit: Payer: Self-pay | Admitting: Cardiovascular Disease

## 2023-05-29 ENCOUNTER — Telehealth: Payer: Self-pay | Admitting: Cardiovascular Disease

## 2023-05-29 DIAGNOSIS — I739 Peripheral vascular disease, unspecified: Secondary | ICD-10-CM

## 2023-05-29 NOTE — Telephone Encounter (Signed)
Left message for patient to return the call to see if she wants to cancel or reschedule her procedure.

## 2023-05-29 NOTE — Telephone Encounter (Signed)
Patient would like to cancel 3/20 procedure with Dr. Allyson Sabal due to a conflict.

## 2023-05-30 ENCOUNTER — Telehealth: Payer: Self-pay | Admitting: Cardiovascular Disease

## 2023-05-30 NOTE — Telephone Encounter (Signed)
See note dated 05/29/23.

## 2023-05-30 NOTE — Telephone Encounter (Signed)
Patient identification verified by 2 forms. Marilynn Rail, RN    Called and spoke to patient  Patient states:   -would like to cancel procedure scheduled for 3/20 with Dr. Allyson Sabal   -requesting to cancel because she is not sure if her heart can take that procedure   -she recalls Dr. Allyson Sabal stating he does not like to do procedure on patients older than 80   -would like to cancel any follow up imaging as well  Informed patient message sent to Dr. Allyson Sabal

## 2023-05-30 NOTE — Telephone Encounter (Signed)
Patient returned RN's call regarding surgery.

## 2023-05-30 NOTE — Telephone Encounter (Signed)
Runell Gess, MD  You; Bernita Buffy, RN27 minutes ago (4:08 PM)    That's fine. We can cancel.  JJB   RN called cath lab  Spoke to karen and canceled 3/20 procedure

## 2023-06-02 ENCOUNTER — Telehealth: Payer: Self-pay | Admitting: Physician Assistant

## 2023-06-02 MED ORDER — LOSARTAN POTASSIUM 25 MG PO TABS: 12.5000 mg | ORAL_TABLET | Freq: Every day | ORAL | 3 refills | Status: DC

## 2023-06-02 NOTE — Telephone Encounter (Signed)
*  STAT* If patient is at the pharmacy, call can be transferred to refill team.   1. Which medications need to be refilled? (please list name of each medication and dose if known) losartan (COZAAR) 25 MG tablet    2. Would you like to learn more about the convenience, safety, & potential cost savings by using the [facility]?      3. Are you open to using the [facility] (Type [facility]. ).   4. Which pharmacy/location (including street and city if local pharmacy) is medication to be sent to?[id number] - [place], [place] - [address]    5. Do they need a 30 day or 90 day supply? 90

## 2023-06-06 NOTE — Telephone Encounter (Signed)
Left message for pt to call back regarding cancelled procedure.

## 2023-06-20 ENCOUNTER — Telehealth: Payer: Self-pay | Admitting: Physician Assistant

## 2023-06-20 NOTE — Telephone Encounter (Signed)
 I tried to call pt and got no answer. Pt is requesting a refill on losartan taking 1 tablet daily. It is stated that pt takes 1/2 daily. I do not see where Dr. Allyson Sabal increased this medication. Please address

## 2023-06-20 NOTE — Telephone Encounter (Signed)
 Attempted to contact pt regarding losartan dosage.  According to this message pt is requesting a 25 mg tablet to take 1 tablet daily.  Our documentation and orders state 25 mg take 1/2 tablet daily. Left message for pt to call back once she gets off work today to discuss.

## 2023-06-20 NOTE — Telephone Encounter (Signed)
 Left another message for pt to call back to discuss why she is requesting RX for losartan 25 mg tabs when medication list states 12.5 mg daily.

## 2023-06-20 NOTE — Telephone Encounter (Signed)
*  STAT* If patient is at the pharmacy, call can be transferred to refill team.   1. Which medications need to be refilled? (please list name of each medication and dose if known) need a new prescription for Losartan stating 1 tablet daily instead of a half of tablet   2. Would you like to learn more about the convenience, safety, & potential cost savings by using the [facility]?    3. Are you open to using the [facility] (Type [facility].   4. Which pharmacy/location (including street and city if local pharmacy) is medication to be sent to?[facility] [place] [facility] [address]   5. Do they need a 30 day or 90 day supply? 90 days and refills- please call today(out of medicine)

## 2023-06-23 NOTE — Telephone Encounter (Signed)
 Left message for pt to call back regarding cancelled procedure.

## 2023-06-24 NOTE — Telephone Encounter (Signed)
 Pt returning call

## 2023-06-24 NOTE — Telephone Encounter (Signed)
 Left message for pt to call back to discuss

## 2023-06-24 NOTE — Telephone Encounter (Signed)
 Patient is returning call. Ask that we call her back today because she will be at work tomorrow. Please advise

## 2023-06-24 NOTE — Telephone Encounter (Signed)
 Attempted to contact pt at work # - unable to reach anyone at [employer].  Left message at home # to call back tomorrow.

## 2023-06-25 MED ORDER — LOSARTAN POTASSIUM 25 MG PO TABS: 25.0000 mg | ORAL_TABLET | Freq: Every day | ORAL | 3 refills | Status: DC

## 2023-06-25 NOTE — Telephone Encounter (Signed)
 LVM (per DPR) for pt letting her know that she may continue taking the increased dose of Losartan, 25 mg once daily (per Dr. Allyson Sabal). A new prescription was ordered and sent to the pt's pharmacy. Pt was told to give [place] a call should she have any questions or concerns.

## 2023-06-25 NOTE — Telephone Encounter (Signed)
 Spoke with pt regarding her prescription refill. Pt stated that Dr. Allyson Sabal told her that "if she was unable to cut the 25 mg Losartan tablets in half, as her prescription is for 12.5 mg daily, to take a whole tablet. Pt stated she had been unable to cut the pills in half because they are small and has been taking a whole 25 mg tablet daily. She then ran out of her supply more quickly and now needs a refill. Pt was told that this information would be forwarded to her doctor and his nurse for advice.

## 2023-06-25 NOTE — Telephone Encounter (Signed)
 Pt returning call to a nurse Please call pt back at work [phone]

## 2023-07-03 ENCOUNTER — Encounter (HOSPITAL_COMMUNITY): Payer: Self-pay

## 2023-07-03 ENCOUNTER — Ambulatory Visit (HOSPITAL_COMMUNITY): Admit: 2023-07-03 | Payer: Medicare Other | Admitting: Cardiovascular Disease

## 2023-07-03 SURGERY — ABDOMINAL AORTOGRAM W/LOWER EXTREMITY
Anesthesia: LOCAL

## 2023-07-17 ENCOUNTER — Ambulatory Visit (HOSPITAL_COMMUNITY): Admission: RE | Admit: 2023-07-17 | Payer: Medicare Other | Source: Ambulatory Visit

## 2023-07-17 ENCOUNTER — Ambulatory Visit (HOSPITAL_COMMUNITY)
Admission: RE | Admit: 2023-07-17 | Payer: Medicare Other | Source: Ambulatory Visit | Attending: Cardiovascular Disease | Admitting: Cardiovascular Disease

## 2023-07-18 NOTE — Telephone Encounter (Signed)
 Looks like duplicate encounter for results, see phone note 05/21/23.

## 2023-07-23 ENCOUNTER — Ambulatory Visit: Payer: Medicare Other | Admitting: Cardiovascular Disease

## 2023-08-04 ENCOUNTER — Ambulatory Visit (HOSPITAL_COMMUNITY): Payer: Medicare Other | Attending: Physician Assistant

## 2023-08-04 DIAGNOSIS — I5020 Unspecified systolic (congestive) heart failure: Secondary | ICD-10-CM | POA: Insufficient documentation

## 2023-08-04 DIAGNOSIS — I25119 Atherosclerotic heart disease of native coronary artery with unspecified angina pectoris: Secondary | ICD-10-CM | POA: Diagnosis present

## 2023-08-04 DIAGNOSIS — I739 Peripheral vascular disease, unspecified: Secondary | ICD-10-CM | POA: Insufficient documentation

## 2023-08-04 LAB — ECHOCARDIOGRAM COMPLETE
Area-P 1/2: 3.03 cm2
P 1/2 time: 566 ms
S' Lateral: 3.7 cm

## 2023-08-06 ENCOUNTER — Encounter: Payer: Self-pay | Admitting: *Deleted

## 2023-08-11 ENCOUNTER — Ambulatory Visit: Payer: Medicare Other | Attending: Cardiology | Admitting: Cardiology

## 2023-08-11 ENCOUNTER — Encounter: Payer: Self-pay | Admitting: Cardiology

## 2023-08-11 VITALS — BP 102/52 | HR 78 | Ht 65.0 in | Wt 141.2 lb

## 2023-08-11 DIAGNOSIS — I25119 Atherosclerotic heart disease of native coronary artery with unspecified angina pectoris: Secondary | ICD-10-CM | POA: Diagnosis not present

## 2023-08-11 DIAGNOSIS — I739 Peripheral vascular disease, unspecified: Secondary | ICD-10-CM | POA: Diagnosis not present

## 2023-08-11 DIAGNOSIS — I5020 Unspecified systolic (congestive) heart failure: Secondary | ICD-10-CM | POA: Diagnosis not present

## 2023-08-11 MED ORDER — NITROGLYCERIN 0.4 MG SL SUBL: 0.4000 mg | SUBLINGUAL_TABLET | SUBLINGUAL | 3 refills | Status: DC | PRN

## 2023-08-11 NOTE — Patient Instructions (Addendum)
 Medication Instructions:  Your physician has recommended you make the following change in your medication:   ** Stop Hydrochlorothiazide    *If you need a refill on your cardiac medications before your next appointment, please call your pharmacy*  Lab Work: None ordered.  If you have labs (blood work) drawn today and your tests are completely normal, you will receive your results only by: MyChart Message (if you have MyChart) OR A paper copy in the mail If you have any lab test that is abnormal or we need to change your treatment, we will call you to review the results.  Testing/Procedures: None ordered.   Follow-Up: At [facility], you and your health needs are our priority.  As part of our continuing mission to provide you with exceptional heart care, our providers are all part of one team.  This team includes your primary Cardiologist (physician) and Advanced Practice Providers or APPs (Physician Assistants and Nurse Practitioners) who all work together to provide you with the care you need, when you need it.  Your next appointment:   12 months with Dr Renna Cary

## 2023-08-11 NOTE — Progress Notes (Signed)
 Cardiology Office Note:  .   Date:  08/11/2023  ID:  Kerri Snyder, DOB 06/24/1940, MRN [id number] PCP: Faustina Hood, MD  [facility] Providers Cardiologist:  Dorothye Gathers, MD PV Cardiologist:  Lauro Portal, MD     History of Present Illness: .   Kerri Snyder is a 83 y.o. female Discussed the use of AI scribe software for clinical note transcription with the patient, who gave verbal consent to proceed.  History of Present Illness Kerri Snyder is an 83 year old female with coronary artery disease and peripheral arterial disease who presents with leg pain and vascular issues. She has been referred by Dr. Lauro Portal for her vascular issues.  She has a history of coronary artery disease with previous stents placed in the LAD and RCA. She also has peripheral arterial disease with a Doppler study in 2024 showing moderate right popliteal artery stenosis and high-grade left tibial peroneal trunk stenosis. She experiences significant leg pain, particularly in her calves, which worsens with walking. The pain is described as severe, especially when walking long distances, such as from her work area to the end of a hallway.  She has been experiencing knee pain, with one knee described as 'bone on bone' and the other having 'a little bit of space.' Her calf pain predates her knee issues.  Her current medications include Plavix  75 mg, Cozaar  (losartan ) 25 mg, Toprol  XL 50 mg, hydrochlorothiazide  25 mg, and Repatha . She reports low blood pressure readings, with a recent measurement of 102/52 mmHg. She also experiences urinary urgency, stating 'when I have to go, have the urge to go to the bathroom, I better go or I'm gonna wet myself.'  She has been taking nitroglycerin  and requires a refill. She is actively working and engaging in physical activity, including walking and lifting, and reports that she will stop if she feels faint.  Her echocardiogram from August 04, 2023, showed improved  heart pump function with an EF of 40-45%, which is mildly reduced, and mild to moderate mitral valve regurgitation. She recalls a previous EF of 30-35%.  She plans to have her cholesterol checked soon, as her last LDL was 91 mg/dL in September 6213.       Studies Reviewed: .        Results LABS LDL: 91 (12/2022)  DIAGNOSTIC Echocardiogram: Improved left ventricular ejection fraction (LVEF) 40-45%, mild to moderate mitral valve regurgitation (08/04/2023) Risk Assessment/Calculations:            Physical Exam:   VS:  BP (!) 102/52   Pulse 78   Ht 5\' 5"  (1.651 m)   Wt 141 lb 3.2 oz (64 kg)   SpO2 99%   BMI 23.50 kg/m    Wt Readings from Last 3 Encounters:  08/11/23 141 lb 3.2 oz (64 kg)  05/21/23 138 lb 12.8 oz (63 kg)  04/14/23 131 lb 3.2 oz (59.5 kg)    GEN: Blind Well nourished, well developed in no acute distress NECK: No JVD; No carotid bruits CARDIAC: RRR, no murmurs, no rubs, no gallops RESPIRATORY:  Clear to auscultation without rales, wheezing or rhonchi  ABDOMEN: Soft, non-tender, non-distended EXTREMITIES:  No edema; No deformity   ASSESSMENT AND PLAN: .    Assessment and Plan Assessment & Plan Coronary artery disease with angina Coronary artery disease with previous stents in LAD and RCA. Intermittent calf pain when walking may indicate peripheral artery disease rather than angina. Continues on Plavix  and nitroglycerin . - Refill  nitroglycerin  prescription.  Peripheral artery disease Moderate right popliteal artery stenosis and high-grade left tibial peroneal trunk stenosis. Significant calf pain when walking suggests claudication. Previous consultation with Dr. Katheryne Pane advised against surgery due to age-related risks, including potential arterial damage and need for major surgery.    Heart failure with reduced ejection fraction (HFrEF) Recent echocardiogram shows improved ejection fraction at 40-45%, previously 30-35%. Mild to moderate mitral valve  regurgitation. Improvement attributed to current management.  Hypertension Blood pressure is low at 102/52 mmHg, suggesting possible overmedication. Hydrochlorothiazide  may contribute to urinary urgency and low blood pressure. - Discontinue hydrochlorothiazide  25 mg daily.  Urinary urgency Reports urgency and inability to hold urine, possibly related to diuretic use (hydrochlorothiazide ). - Discontinue hydrochlorothiazide  25 mg daily. - BP 102 SBP         Dispo: 1 yr  Signed, Dorothye Gathers, MD

## 2023-08-21 ENCOUNTER — Encounter: Payer: Self-pay | Admitting: Physician Assistant

## 2023-08-21 ENCOUNTER — Ambulatory Visit: Admitting: Physician Assistant

## 2023-08-21 VITALS — BP 157/79 | HR 90 | Ht 65.0 in | Wt 149.0 lb

## 2023-08-21 DIAGNOSIS — R030 Elevated blood-pressure reading, without diagnosis of hypertension: Secondary | ICD-10-CM | POA: Diagnosis not present

## 2023-08-21 DIAGNOSIS — E782 Mixed hyperlipidemia: Secondary | ICD-10-CM

## 2023-08-21 DIAGNOSIS — M542 Cervicalgia: Secondary | ICD-10-CM | POA: Diagnosis not present

## 2023-08-21 DIAGNOSIS — H548 Legal blindness, as defined in USA: Secondary | ICD-10-CM | POA: Diagnosis not present

## 2023-08-21 NOTE — Patient Instructions (Addendum)
 VISIT SUMMARY:  You came in today for a cholesterol test and to evaluate your neck pain. We discussed your symptoms and reviewed your current health status.  YOUR PLAN:  -NECK PAIN: Your neck pain is likely due to muscle tightness and poor sleeping and standing  posture. To help relieve the pain, apply a warm towel to your neck muscles several times a day, perform stretching exercises for your neck and shoulders, continue using Salonpas patches, and increase your water  intake to keep your muscles hydrated.  -HYPERTENSION: Your blood pressure was elevated today at 158/76. Since you are not currently on any blood pressure medication, it is important to monitor your blood pressure at home regularly to keep track of any changes.   Cervical Sprain A cervical sprain is a stretch or tear in one or more of the ligaments in the neck. Ligaments are the tissues that connect bones to each other. Cervical sprains can range from mild to severe. Severe cervical sprains can cause the spinal bones (vertebrae) in the neck to be unstable. This can result in spinal cord damage and serious nervous system problems. Healing time for a cervical sprain depends on the cause and extent of the injury. Most cervical sprains heal in 4-6 weeks. What are the causes? Cervical sprains may be caused by trauma, such as an injury from a motor vehicle accident, a fall, or a sudden forward and backward whipping movement of the head and neck (whiplash injury). Mild cervical sprains may be caused by wear and tear over time. What increases the risk? You are more likely to get a cervical sprain if: You take part in activities that have a high risk of trauma to the neck. These include contact sports, gymnastics, and diving. You have: Osteoarthritis of the spine. Poor strength and flexibility of the neck. Poor posture. You have had a neck injury in the past. You spend long periods in positions that put stress on the neck, such as  sitting at a computer. What are the signs or symptoms? Symptoms of this condition include: Any of these problems in the neck, shoulders, or upper back: Pain or tenderness. Stiffness. Swelling. A burning feeling. Sudden tightening of neck muscles (spasms). Limited ability to move the neck. Headache. Dizziness. Nausea or vomiting. Weakness, numbness, or tingling in a hand or an arm. Symptoms may develop right away after injury or may develop over a few days. In some cases, symptoms may go away with treatment and return (recur) over time. How is this diagnosed? This condition may be diagnosed based on: Your symptoms, medical history, and a physical exam. Any recent injuries or known neck problems that you have, such as arthritis in the neck. Imaging tests, such as X-rays, an MRI, or a CT scan. How is this treated? This condition is treated by resting and icing the injured area and doing physical therapy exercises to improve movement and strength. Heat therapy may be used 2-3 days after the injury if there is no swelling. Depending on the severity of your condition, treatment may also include: Keeping your neck in place (immobilized) for periods of time. This may be done using: A cervical collar. This supports your chin and the back of your head. A cervical traction device. This is a sling that holds up your head. It removes weight and pressure from your neck. Medicines for pain or other symptoms. Surgery. This is rare. Follow these instructions at home: Medicines Take over-the-counter and prescription medicines only as told by your health  care provider. Ask your provider if the medicine prescribed to you: Requires you to avoid driving or using machinery. Can cause constipation. You may need to take these actions to prevent or treat constipation: Drink enough fluid to keep your pee pale yellow. Take over-the-counter or prescription medicines. Eat foods that are high in fiber, such as  beans, whole grains, and fresh fruits and vegetables. Limit foods that are high in fat and processed sugars, such as fried or sweet foods. If you have a cervical collar: Wear the collar as told by your provider. Do not remove it unless told. Ask before making any adjustments to your collar. If you have long hair, keep it outside of the collar. If you are allowed to remove the collar for cleaning and bathing: Follow instructions about how to remove it safely. Clean it by hand with mild soap and water  and air-dry it completely. If your collar has removable pads, remove them every 1-2 days and wash them by hand with soap and water . Let them air-dry completely before putting them back in the collar. Tell your provider if your skin under the collar has irritation or sores. Managing pain, stiffness, and swelling     Use a cervical traction device as told. If told, put ice on the affected area. Put ice in a plastic bag. Place a towel between your skin and the bag. Leave the ice on for 20 minutes, 2-3 times a day. If told, apply heat to the affected area before you exercise or as often as told by your provider. Use the heat source that your provider recommends, such as a moist heat pack or a heating pad. Place a towel between your skin and the heat source. Leave the heat on for 20-30 minutes. If your skin turns bright red, remove the ice or heat right away to prevent skin damage. The risk of damage is higher if you cannot feel pain, heat, or cold. Activity Do not drive while wearing a cervical collar. If you do not have a cervical collar, ask if it is safe to drive while your neck heals. Do not lift anything that is heavier than 10 lb (4.5 kg) until your provider says that it is safe. Rest as told by your provider. Avoid positions and activities that make your symptoms worse. Do physical therapy exercises as told by your provider or physical therapist. Return to your normal activities as told  by your provider. Ask your provider what activities are safe for you. General instructions Do not use any products that contain nicotine or tobacco. These products include cigarettes, chewing tobacco, and vaping devices, such as e-cigarettes. These can delay healing. If you need help quitting, ask your provider. Keep all follow-up visits. Your provider will monitor your injury and activity level. How is this prevented? To prevent a cervical sprain from happening again: Use and maintain good posture. Make any needed adjustments to your workstation to help you do this. Exercise regularly as told by your provider or physical therapist. Avoid risky activities that may cause a cervical sprain. Contact a health care provider if: You have symptoms that get worse or do not get better after 2 weeks of treatment. You have new symptoms. Your pain gets worse or does not get better with medicine. You have sores or irritated skin on your neck from wearing your cervical collar. Get help right away if: You have severe pain. You develop numbness, tingling, or weakness in any part of your body. You cannot move  a part of your body (you have paralysis). You have neck pain along with severe dizziness or headache. This information is not intended to replace advice given to you by your health care provider. Make sure you discuss any questions you have with your health care provider. Document Revised: 11/02/2021 Document Reviewed: 11/02/2021 Elsevier Patient Education  2024 [employer].

## 2023-08-21 NOTE — Progress Notes (Signed)
 New Patient Office Visit  Subjective    Patient ID: Kerri Snyder, female    DOB: 02-Feb-1941  Age: 83 y.o. MRN: [id number]  CC:  Chief Complaint  Patient presents with   Neck Pain    Patient states she's had neck pain since Monday   Discussed the use of AI scribe software for clinical note transcription with the patient, who gave verbal consent to proceed.  History of Present Illness   An 83 year old female who presents for a cholesterol test and evaluation of neck pain.  She experiences bilateral neck pain since Monday, described as a 'crick' with limited rotation. Salonpas patches and Tylenol  provide partial relief. There is no numbness, tingling, or sore throat. A slight headache occurred yesterday, likely sinus-related. She notes a frequently swollen and sometimes tender neck gland, not believed to be related to the current pain.  Previously on blood pressure medication, she discontinued it due to low readings. She does not take antihypertensive medication currently and has not been regularly monitoring her blood pressure, though a high reading was noted this morning. She maintains hydration with at least two bottles of water  daily and is active at work, which may contribute to muscle tightness. She is here for a routine cholesterol test, as it has been a year since her last normal result.   Outpatient Encounter Medications as of 08/21/2023  Medication Sig   ALPRAZolam (XANAX) 0.25 MG tablet Take 0.25 mg by mouth 2 (two) times daily.   Ascorbic Acid (VITAMIN C PO) Take 1 tablet by mouth every morning.   Cholecalciferol (VITAMIN D PO) Take 1 tablet by mouth daily.    clopidogrel  (PLAVIX ) 75 MG tablet Take 1 tablet (75 mg total) by mouth daily with breakfast.   Evolocumab  (REPATHA  SURECLICK) 140 MG/ML SOAJ INJECT 140 MG INTO THE SKIN EVERY 14 (FOURTEEN) DAYS.   hydroxypropyl methylcellulose (ISOPTO TEARS) 2.5 % ophthalmic solution Place 1 drop into both eyes 4 (four) times daily as  needed for dry eyes.   ketoconazole (NIZORAL) 2 % cream 1 application to affected area   losartan  (COZAAR ) 25 MG tablet Take 1 tablet (25 mg total) by mouth daily.   metoprolol  succinate (TOPROL -XL) 50 MG 24 hr tablet Take 1 tablet (50 mg total) by mouth daily. Take with or immediately following a meal.   nitroGLYCERIN  (NITROSTAT ) 0.4 MG SL tablet Place 1 tablet (0.4 mg total) under the tongue every 5 (five) minutes as needed for chest pain.   vitamin B-12 (CYANOCOBALAMIN) 1000 MCG tablet Take 1,000 mcg by mouth daily.   isosorbide  mononitrate (IMDUR ) 60 MG 24 hr tablet Take 1.5 tablets (90 mg total) by mouth daily.   No facility-administered encounter medications on file as of 08/21/2023.    Past Medical History:  Diagnosis Date   Albinism (HCC)    Anxiety disorder, unspecified    Blind    legally blind   Carotid artery occlusion    Cerebrovascular disease, unspecified    Coronary artery disease    DDD (degenerative disc disease), lumbar    Diverticulitis large intestine w/o perforation or abscess w/o bleeding    Exercise-induced angina (HCC)    Family history of anesthesia complication    cousin with nausea and vomiting post anesthesia   First degree hemorrhoids    Hereditary and idiopathic neuropathy, unspecified    Hyperlipidemia    Hypertension    Osteopenia    Peripheral vascular disease (HCC) 02/27/2021   ABIs/Arterial US  01/29/23: R 0.81, L  0.74 // R SFA/AK Pop 50-74; L BK Pop/Prox Tib-Peroneal Trunk 75-99      PFO (patent foramen ovale)    Sciatica    Stable angina (HCC)    Stroke (HCC) 06/2013    Past Surgical History:  Procedure Laterality Date   CAROTID ENDARTERECTOMY Left June 25, 2013   cea   CAROTID ENDARTERECTOMY Right August 06, 2013   cea   CESAREAN SECTION     CORONARY ATHERECTOMY N/A 09/09/2019   Procedure: CORONARY ATHERECTOMY;  Surgeon: Lucendia Rusk, MD;  Location: [facility] INVASIVE CV LAB;  Service: Cardiovascular;  Laterality: N/A;   CORONARY  STENT INTERVENTION N/A 08/10/2019   Procedure: CORONARY STENT INTERVENTION;  Surgeon: Lucendia Rusk, MD;  Location: [facility] INVASIVE CV LAB;  Service: Cardiovascular;  Laterality: N/A;   CORONARY STENT INTERVENTION  09/09/2019   CORONARY STENT INTERVENTION N/A 09/09/2019   Procedure: CORONARY STENT INTERVENTION;  Surgeon: Lucendia Rusk, MD;  Location: [facility] INVASIVE CV LAB;  Service: Cardiovascular;  Laterality: N/A;   CORONARY ULTRASOUND/IVUS N/A 08/10/2019   Procedure: Intravascular Ultrasound/IVUS;  Surgeon: Lucendia Rusk, MD;  Location: [facility] INVASIVE CV LAB;  Service: Cardiovascular;  Laterality: N/A;   CORONARY ULTRASOUND/IVUS N/A 09/09/2019   Procedure: Intravascular Ultrasound/IVUS;  Surgeon: Lucendia Rusk, MD;  Location: [facility] INVASIVE CV LAB;  Service: Cardiovascular;  Laterality: N/A;   Ear drum replacement 2000 Right 2000   ENDARTERECTOMY Left 06/25/2013   Procedure: ENDARTERECTOMY CAROTID;  Surgeon: Mayo Speck, MD;  Location: [facility] OR;  Service: Vascular;  Laterality: Left;   ENDARTERECTOMY Right 08/06/2013   Procedure:  Carotid Endarterectomy with hemashield patch angioplasty;  Surgeon: Mayo Speck, MD;  Location: [facility] OR;  Service: Vascular;  Laterality: Right;   LEFT HEART CATH AND CORONARY ANGIOGRAPHY N/A 08/10/2019   Procedure: LEFT HEART CATH AND CORONARY ANGIOGRAPHY;  Surgeon: Lucendia Rusk, MD;  Location: [facility] INVASIVE CV LAB;  Service: Cardiovascular;  Laterality: N/A;   LEFT HEART CATH AND CORONARY ANGIOGRAPHY N/A 09/09/2019   Procedure: LEFT HEART CATH AND CORONARY ANGIOGRAPHY;  Surgeon: Lucendia Rusk, MD;  Location: [facility] INVASIVE CV LAB;  Service: Cardiovascular;  Laterality: N/A;   TEE WITHOUT CARDIOVERSION N/A 06/17/2013   Procedure: TRANSESOPHAGEAL ECHOCARDIOGRAM (TEE);  Surgeon: Lake Pilgrim, MD;  Location: [facility] ENDOSCOPY;  Service: Cardiovascular;  Laterality: N/A;   TONSILLECTOMY     TUBAL LIGATION      Family History  Problem Relation Age of Onset    Diabetes Father    Heart disease Father        Before age 72   Alcohol abuse Father    Alzheimer's disease Mother     Social History   Socioeconomic History   Marital status: Married    Spouse name: Not on file   Number of children: Not on file   Years of education: Not on file   Highest education level: Not on file  Occupational History   Not on file  Tobacco Use   Smoking status: Never   Smokeless tobacco: Never  Vaping Use   Vaping status: Never Used  Substance and Sexual Activity   Alcohol use: No    Alcohol/week: 0.0 standard drinks of alcohol   Drug use: No   Sexual activity: Never  Other Topics Concern   Not on file  Social History Narrative   Not on file   Social Drivers of Health   Financial Resource Strain: Not on file  Food Insecurity: Not on file  Transportation  Needs: Not on file  Physical Activity: Not on file  Stress: Not on file  Social Connections: Not on file  Intimate Partner Violence: Not on file    Review of Systems  Constitutional: Negative.   HENT: Negative.    Eyes: Negative.   Respiratory:  Negative for shortness of breath.   Cardiovascular:  Negative for chest pain.  Gastrointestinal: Negative.   Genitourinary: Negative.   Musculoskeletal:  Positive for myalgias and neck pain.  Skin: Negative.   Neurological:  Positive for headaches.  Endo/Heme/Allergies: Negative.   Psychiatric/Behavioral: Negative.          Objective    BP (!) 157/79 (BP Location: Left Arm, Patient Position: Sitting, Cuff Size: Normal)   Pulse 90   Ht 5\' 5"  (1.651 m)   Wt 149 lb (67.6 kg)   BMI 24.79 kg/m   Physical Exam Vitals and nursing note reviewed.  Constitutional:      General: She is not in acute distress.    Appearance: Normal appearance.  HENT:     Head: Normocephalic and atraumatic.     Right Ear: External ear normal.     Left Ear: External ear normal.     Nose: Nose normal.     Mouth/Throat:     Mouth: Mucous membranes are  moist.     Pharynx: Oropharynx is clear.  Eyes:     Comments: Legally blind  Neck:     Comments: Limited ROM, slight pain with rotation Cardiovascular:     Rate and Rhythm: Normal rate and regular rhythm.     Pulses: Normal pulses.     Heart sounds: Normal heart sounds.  Pulmonary:     Effort: Pulmonary effort is normal.     Breath sounds: Normal breath sounds.  Musculoskeletal:        General: Normal range of motion.     Cervical back: No edema, signs of trauma, rigidity or torticollis. Pain with movement present.  Skin:    General: Skin is warm and dry.  Neurological:     Mental Status: She is alert and oriented to person, place, and time.  Psychiatric:        Mood and Affect: Mood normal.        Behavior: Behavior normal.        Thought Content: Thought content normal.        Judgment: Judgment normal.    Assessment & Plan:   Problem List Items Addressed This Visit       Other   Mixed hyperlipidemia (Chronic)   Relevant Orders   Lipid panel   Legally blind   Other Visit Diagnoses       Cervical pain    -  Primary     Elevated blood pressure reading          1. Cervical pain (Primary) Acute bilateral neck pain likely musculoskeletal due to poor sleeping posture and muscle tightness. Improved with Salonpas and acetaminophen . No neurological symptoms. Muscle relaxants not advised due to fall risk. - Apply warm towel to neck muscles several times a day. - Perform stretching exercises for neck and shoulders. - Continue using Salonpas patches for relief. - Increase water  intake for muscle hydration.  2. Mixed hyperlipidemia Fasting lab completed today  - Lipid panel  3. Elevated blood pressure reading Blood pressure elevated at 158/76. No antihypertensive medication due to previously low readings. - Monitor blood pressure at home regularly.  4. Legally blind   I have reviewed the  patient's medical history (PMH, PSH, Social History, Family History,  Medications, and allergies) , and have been updated if relevant. I spent 30 minutes reviewing chart and  face to face time with patient.    Return if symptoms worsen or fail to improve.   Etter Hermann Mayers, PA-C

## 2023-08-22 LAB — LIPID PANEL
Chol/HDL Ratio: 2.9 ratio (ref 0.0–4.4)
Cholesterol, Total: 154 mg/dL (ref 100–199)
HDL: 53 mg/dL (ref >39)
LDL Chol Calc (NIH): 82 mg/dL (ref 0–99)
Triglycerides: 107 mg/dL (ref 0–149)
VLDL Cholesterol Cal: 19 mg/dL (ref 5–40)

## 2023-08-25 ENCOUNTER — Encounter: Payer: Self-pay | Admitting: Physician Assistant

## 2023-09-01 ENCOUNTER — Encounter: Payer: Self-pay | Admitting: Cardiovascular Disease

## 2023-09-09 ENCOUNTER — Ambulatory Visit: Payer: Self-pay

## 2023-09-24 ENCOUNTER — Telehealth: Payer: Self-pay | Admitting: Physician Assistant

## 2023-09-24 NOTE — Telephone Encounter (Signed)
 Left message for patient to call back

## 2023-09-24 NOTE — Telephone Encounter (Signed)
 Pt states she would like to start taking Gel shots for her knees and would like to make sure that's okay. Please advise

## 2023-09-25 NOTE — Telephone Encounter (Signed)
 Patient calling to make provider aware [facility] will be providing gel shots in  bilateral knee July 2025.  Patient calling for parameters before surgical procedure. Per patient [facility] checking with insurance company for approval and  then patient will be scheduled for Gel shots  knees. Patient made aware to have [facility] fax over pre op clearance form.  Made patient aware that this will be forward to provider to make aware. Patient verbalized an understanding.

## 2023-09-25 NOTE — Telephone Encounter (Signed)
 Please create surgical clearance encounter and route to P CV DIV PREOP once information regarding procedure and surgeon requests are known. Kerri Single, PA-C    09/25/2023 9:20 AM

## 2023-09-25 NOTE — Telephone Encounter (Signed)
 Follow Up:      Patient is returning a call from yesterday.

## 2023-09-25 NOTE — Telephone Encounter (Signed)
 Left message for pt to call.

## 2023-09-25 NOTE — Telephone Encounter (Signed)
 See previous messages. Our office will await clearance request to come over from [facility].

## 2023-09-29 NOTE — Telephone Encounter (Signed)
 Left message for surgery scheduler Abe Abed that we have not yet received a clearance request for this pt. Our fax # [phone] attn preop team

## 2023-11-04 ENCOUNTER — Telehealth: Payer: Self-pay | Admitting: Cardiology

## 2023-11-04 NOTE — Telephone Encounter (Signed)
 Pt called in stating she has really bad arthritis in her knees and asked what pain meds are safe to take. Please advise.

## 2023-11-04 NOTE — Telephone Encounter (Signed)
 Left detailed VM advising that we recommend Tylenol  and to avoid NSAID's, especially since pt is on Plavix , per chart review. Advised pt (in the Voicemail) to get in touch with her PCP's office as they could probably prescribe other pain management medications for her arthritis.

## 2023-11-29 ENCOUNTER — Ambulatory Visit (HOSPITAL_COMMUNITY): Admission: EM | Admit: 2023-11-29 | Discharge: 2023-11-29 | Disposition: A | Discharge status: Home / Self Care

## 2023-11-29 ENCOUNTER — Encounter (HOSPITAL_COMMUNITY): Payer: Self-pay

## 2023-11-29 DIAGNOSIS — M25551 Pain in right hip: Secondary | ICD-10-CM | POA: Diagnosis not present

## 2023-11-29 DIAGNOSIS — I959 Hypotension, unspecified: Secondary | ICD-10-CM

## 2023-11-29 DIAGNOSIS — G8929 Other chronic pain: Secondary | ICD-10-CM | POA: Diagnosis not present

## 2023-11-29 MED ORDER — DEXAMETHASONE SODIUM PHOSPHATE 10 MG/ML IJ SOLN
INTRAMUSCULAR | Status: AC
  Filled 2023-11-29: qty 1

## 2023-11-29 MED ORDER — PREDNISONE 20 MG PO TABS: 40.0000 mg | ORAL_TABLET | Freq: Every day | ORAL | 0 refills | Status: AC

## 2023-11-29 MED ORDER — DEXAMETHASONE SODIUM PHOSPHATE 10 MG/ML IJ SOLN
10.0000 mg | Freq: Once | INTRAMUSCULAR | Status: AC
  Administered 2023-11-29: 10 mg via INTRAMUSCULAR

## 2023-11-29 NOTE — ED Provider Notes (Signed)
 [facility]    CSN: [id number] Arrival date & time: 11/29/23  1003      History   Chief Complaint Chief Complaint  Patient presents with   Hip Pain    HPI Kerri Snyder is a 83 y.o. female.   Patient presents with chronic right hip pain that is slowly worsened over the last week.  Patient states that the pain became much worse yesterday and she is having a hard time standing and walking normally because of the pain.  Patient states that she has been using a cane to walk.  Patient denies any recent falls or injuries.  Patient states that she is taken Advil and Tylenol  with some relief.  Patient states that she did call her orthopedic doctor but they were unable to see her until September.  Patient states that she normally will get intra-articular injection per her orthopedic doctor as needed for her hip pain, but states that she cannot wait until September to receive this.  Of note patient's blood pressure is low in clinic today.  Initial reading was 84/51 and second reading was 91/51.  Patient denies dizziness, weakness, lightheadedness, chest pain, palpitations, or shortness of breath.  Patient states that she took her hydrochlorothiazide  as prescribed last night.  Patient states that she took all of her other medications as prescribed this morning.  Past medical history includes hypertension, hyperlipidemia, osteopenia, arthritis, CAD, stroke, and blindness.  The history is provided by the patient and medical records.  Hip Pain    Past Medical History:  Diagnosis Date   Albinism (HCC)    Anxiety disorder, unspecified    Blind    legally blind   Carotid artery occlusion    Cerebrovascular disease, unspecified    Coronary artery disease    DDD (degenerative disc disease), lumbar    Diverticulitis large intestine w/o perforation or abscess w/o bleeding    Exercise-induced angina (HCC)    Family history of anesthesia complication    cousin with nausea and  vomiting post anesthesia   First degree hemorrhoids    Hereditary and idiopathic neuropathy, unspecified    Hyperlipidemia    Hypertension    Osteopenia    Peripheral vascular disease (HCC) 02/27/2021   ABIs/Arterial US  01/29/23: R 0.81, L 0.74 // R SFA/AK Pop 50-74; L BK Pop/Prox Tib-Peroneal Trunk 75-99      PFO (patent foramen ovale)    Sciatica    Stable angina (HCC)    Stroke (HCC) 06/2013    Patient Active Problem List   Diagnosis Date Noted   Claudication of calf muscles (HCC) 01/20/2023   HFrEF (heart failure with reduced ejection fraction) (HCC) 08/13/2022   Moderate to severe mitral regurgitation 08/12/2022   Albinism (HCC) 02/27/2021   Anxiety disorder 02/27/2021   Atrial septal defect within oval fossa 02/27/2021   Diverticular disease of colon 02/27/2021   First degree hemorrhoids 02/27/2021   Hereditary and idiopathic neuropathy, unspecified 02/27/2021   Osteopenia 02/27/2021   Peripheral vascular disease (HCC) 02/27/2021   Essential hypertension 02/27/2021   Closed fracture of distal phalanx of finger 09/05/2020   Coronary artery disease involving native coronary artery of native heart with angina pectoris (HCC) 09/09/2019   Nonspecific abnormal electrocardiogram (ECG) (EKG) 08/06/2019   Legally blind 08/06/2019   History of stroke 08/06/2019   Aftercare following surgery of the circulatory system, NEC 08/24/2013   Occlusion and stenosis of carotid artery without mention of cerebral infarction 07/13/2013   Carotid stenosis 06/25/2013  TIA (transient ischemic attack) 06/14/2013   Slurred speech 06/14/2013   Elevated blood pressure 06/14/2013   Mixed hyperlipidemia 06/14/2013   Supratherapeutic INR 06/14/2013   Cerebral infarction (HCC) 06/14/2013    Past Surgical History:  Procedure Laterality Date   CAROTID ENDARTERECTOMY Left June 25, 2013   cea   CAROTID ENDARTERECTOMY Right August 06, 2013   cea   CESAREAN SECTION     CORONARY ATHERECTOMY N/A  09/09/2019   Procedure: CORONARY ATHERECTOMY;  Surgeon: Dann Candyce RAMAN, MD;  Location: [facility] INVASIVE CV LAB;  Service: Cardiovascular;  Laterality: N/A;   CORONARY STENT INTERVENTION N/A 08/10/2019   Procedure: CORONARY STENT INTERVENTION;  Surgeon: Dann Candyce RAMAN, MD;  Location: [facility] INVASIVE CV LAB;  Service: Cardiovascular;  Laterality: N/A;   CORONARY STENT INTERVENTION  09/09/2019   CORONARY STENT INTERVENTION N/A 09/09/2019   Procedure: CORONARY STENT INTERVENTION;  Surgeon: Dann Candyce RAMAN, MD;  Location: [facility] INVASIVE CV LAB;  Service: Cardiovascular;  Laterality: N/A;   CORONARY ULTRASOUND/IVUS N/A 08/10/2019   Procedure: Intravascular Ultrasound/IVUS;  Surgeon: Dann Candyce RAMAN, MD;  Location: [facility] INVASIVE CV LAB;  Service: Cardiovascular;  Laterality: N/A;   CORONARY ULTRASOUND/IVUS N/A 09/09/2019   Procedure: Intravascular Ultrasound/IVUS;  Surgeon: Dann Candyce RAMAN, MD;  Location: [facility] INVASIVE CV LAB;  Service: Cardiovascular;  Laterality: N/A;   Ear drum replacement 2000 Right 2000   ENDARTERECTOMY Left 06/25/2013   Procedure: ENDARTERECTOMY CAROTID;  Surgeon: Krystal JULIANNA Doing, MD;  Location: [facility] OR;  Service: Vascular;  Laterality: Left;   ENDARTERECTOMY Right 08/06/2013   Procedure:  Carotid Endarterectomy with hemashield patch angioplasty;  Surgeon: Krystal JULIANNA Doing, MD;  Location: [facility] OR;  Service: Vascular;  Laterality: Right;   LEFT HEART CATH AND CORONARY ANGIOGRAPHY N/A 08/10/2019   Procedure: LEFT HEART CATH AND CORONARY ANGIOGRAPHY;  Surgeon: Dann Candyce RAMAN, MD;  Location: [facility] INVASIVE CV LAB;  Service: Cardiovascular;  Laterality: N/A;   LEFT HEART CATH AND CORONARY ANGIOGRAPHY N/A 09/09/2019   Procedure: LEFT HEART CATH AND CORONARY ANGIOGRAPHY;  Surgeon: Dann Candyce RAMAN, MD;  Location: [facility] INVASIVE CV LAB;  Service: Cardiovascular;  Laterality: N/A;   TEE WITHOUT CARDIOVERSION N/A 06/17/2013   Procedure: TRANSESOPHAGEAL ECHOCARDIOGRAM (TEE);  Surgeon: Aleene JINNY Passe, MD;  Location: [facility] ENDOSCOPY;  Service: Cardiovascular;  Laterality: N/A;   TONSILLECTOMY     TUBAL LIGATION      OB History   No obstetric history on file.      Home Medications    Prior to Admission medications   Medication Sig Start Date End Date Taking? Authorizing Provider  ALPRAZolam (XANAX) 0.25 MG tablet Take 0.25 mg by mouth 2 (two) times daily. 01/26/21  Yes [provider]  Ascorbic Acid (VITAMIN C PO) Take 1 tablet by mouth every morning.   Yes [provider]  Cholecalciferol (VITAMIN D PO) Take 1 tablet by mouth daily.    Yes [provider]  clopidogrel  (PLAVIX ) 75 MG tablet Take 1 tablet (75 mg total) by mouth daily with breakfast. 06/17/13  Yes Regalado, Belkys A, MD  Evolocumab  (REPATHA  SURECLICK) 140 MG/ML SOAJ INJECT 140 MG INTO THE SKIN EVERY 14 (FOURTEEN) DAYS. 11/04/22  Yes Jeffrie Oneil BROCKS, MD  hydrochlorothiazide  (HYDRODIURIL ) 25 MG tablet Take 25 mg by mouth daily.   Yes [provider]  hydroxypropyl methylcellulose (ISOPTO TEARS) 2.5 % ophthalmic solution Place 1 drop into both eyes 4 (four) times daily as needed for dry eyes.   Yes [provider]  ketoconazole (  NIZORAL) 2 % cream 1 application to affected area 12/27/09  Yes [provider]  metoprolol  succinate (TOPROL -XL) 50 MG 24 hr tablet Take 1 tablet (50 mg total) by mouth daily. Take with or immediately following a meal. 12/04/22  Yes Weaver, Scott T, PA-C  predniSONE  (DELTASONE ) 20 MG tablet Take 2 tablets (40 mg total) by mouth daily for 3 days. 11/29/23 12/02/23 Yes Braysen Cloward A, NP  vitamin B-12 (CYANOCOBALAMIN) 1000 MCG tablet Take 1,000 mcg by mouth daily.   Yes [provider]  isosorbide  mononitrate (IMDUR ) 60 MG 24 hr tablet Take 1.5 tablets (90 mg total) by mouth daily. 01/20/23 04/20/23  Lelon Hamilton T, PA-C  nitroGLYCERIN  (NITROSTAT ) 0.4 MG SL tablet Place 1 tablet (0.4 mg total) under the tongue every 5 (five) minutes as needed  for chest pain. 08/11/23   Jeffrie Oneil BROCKS, MD    Family History Family History  Problem Relation Age of Onset   Diabetes Father    Heart disease Father        Before age 23   Alcohol abuse Father    Alzheimer's disease Mother     Social History Social History   Tobacco Use   Smoking status: Never   Smokeless tobacco: Never  Vaping Use   Vaping status: Never Used  Substance Use Topics   Alcohol use: No    Alcohol/week: 0.0 standard drinks of alcohol   Drug use: No     Allergies   Pitavastatin and Rosuvastatin    Review of Systems Review of Systems  Per HPI  Physical Exam Triage Vital Signs ED Triage Vitals  Encounter Vitals Group     BP 11/29/23 1023 (!) 84/51     Girls Systolic BP Percentile --      Girls Diastolic BP Percentile --      Boys Systolic BP Percentile --      Boys Diastolic BP Percentile --      Pulse Rate 11/29/23 1023 90     Resp 11/29/23 1023 18     Temp 11/29/23 1023 97.9 F (36.6 C)     Temp Source 11/29/23 1023 Oral     SpO2 11/29/23 1023 92 %     Weight 11/29/23 1022 135 lb (61.2 kg)     Height 11/29/23 1022 5' 5 (1.651 m)     Head Circumference --      Peak Flow --      Pain Score 11/29/23 1020 10     Pain Loc --      Pain Education --      Exclude from Growth Chart --    No data found.  Updated Vital Signs BP (!) 91/51   Pulse 90   Temp 97.9 F (36.6 C) (Oral)   Resp 18   Ht 5' 5 (1.651 m)   Wt 135 lb (61.2 kg)   SpO2 92%   BMI 22.47 kg/m   Visual Acuity Right Eye Distance:   Left Eye Distance:   Bilateral Distance:    Right Eye Near:   Left Eye Near:    Bilateral Near:     Physical Exam Vitals and nursing note reviewed.  Constitutional:      General: She is awake. She is not in acute distress.    Appearance: Normal appearance. She is well-developed and well-groomed. She is not ill-appearing.  Musculoskeletal:     Cervical back: Normal.     Thoracic back: Normal.     Lumbar back: Normal.  Right hip:  No deformity, tenderness or bony tenderness. Decreased range of motion. Normal strength.     Right upper leg: Normal.  Skin:    General: Skin is warm and dry.  Neurological:     General: No focal deficit present.     Mental Status: She is alert and oriented to person, place, and time. Mental status is at baseline.     GCS: GCS eye subscore is 4. GCS verbal subscore is 5. GCS motor subscore is 6.     Gait: Gait abnormal.     Comments: Walks with a limp with cane secondary to pain.  Psychiatric:        Behavior: Behavior is cooperative.      UC Treatments / Results  Labs (all labs ordered are listed, but only abnormal results are displayed) Labs Reviewed - No data to display  EKG   Radiology No results found.  Procedures Procedures (including critical care time)  Medications Ordered in UC Medications  dexamethasone  (DECADRON ) injection 10 mg (10 mg Intramuscular Given 11/29/23 1047)    Initial Impression / Assessment and Plan / UC Course  I have reviewed the triage vital signs and the nursing notes.  Pertinent labs & imaging results that were available during my care of the patient were reviewed by me and considered in my medical decision making (see chart for details).     Patient is overall well-appearing.  Vitals are stable.  Given IM Decadron  in clinic.  Prescribed short prednisone  burst for additional relief of chronic hip pain.  Discussed importance of monitoring her blood pressure due to it reading low today and recommended following up with primary care provider if it continues to be low.  Discussed follow-up, return, and strict ER precautions. Final Clinical Impressions(s) / UC Diagnoses   Final diagnoses:  Chronic right hip pain  Hypotension, unspecified hypotension type     Discharge Instructions      You have received an injection of Decadron  in clinic today which is a steroid to help with your pain. Take 2 tablets of prednisone  once daily for 3 days  for additional relief. You can continue taking 500 mg of Tylenol  every 6-8 hours as needed for pain. You can also alternate between ice and heat as needed for pain. Follow-up with your orthopedic doctor for further evaluation and management of your hip pain if it continues.  Keep an eye on your blood pressure.  If you continue to have low readings I recommend following up with your primary care provider as they may need to adjust your medications. If you keep having low readings but also develop persistent dizziness, weakness, or feeling like you might pass out please seek immediate medical treatment in the emergency department.   ED Prescriptions     Medication Sig Dispense Auth. Provider   predniSONE  (DELTASONE ) 20 MG tablet Take 2 tablets (40 mg total) by mouth daily for 3 days. 6 tablet Johnie Flaming A, NP      PDMP not reviewed this encounter.   Johnie Flaming A, NP 11/29/23 1118

## 2023-11-29 NOTE — ED Triage Notes (Signed)
 Ongoing right side hip pain that got worse yesterday. Patient had a hard time standing and walking. Denies recent injuries or falls.   Patient tried Advil and tylenol  with moderate relief.

## 2023-11-29 NOTE — Discharge Instructions (Addendum)
 You have received an injection of Decadron  in clinic today which is a steroid to help with your pain. Take 2 tablets of prednisone  once daily for 3 days for additional relief. You can continue taking 500 mg of Tylenol  every 6-8 hours as needed for pain. You can also alternate between ice and heat as needed for pain. Follow-up with your orthopedic doctor for further evaluation and management of your hip pain if it continues.  Keep an eye on your blood pressure.  If you continue to have low readings I recommend following up with your primary care provider as they may need to adjust your medications. If you keep having low readings but also develop persistent dizziness, weakness, or feeling like you might pass out please seek immediate medical treatment in the emergency department.

## 2023-11-30 ENCOUNTER — Other Ambulatory Visit: Payer: Self-pay | Admitting: Cardiology

## 2023-12-23 ENCOUNTER — Ambulatory Visit: Attending: Cardiovascular Disease | Admitting: Cardiovascular Disease

## 2023-12-23 ENCOUNTER — Encounter: Payer: Self-pay | Admitting: Cardiovascular Disease

## 2023-12-23 ENCOUNTER — Other Ambulatory Visit: Payer: Self-pay | Admitting: Physician Assistant

## 2023-12-23 VITALS — BP 120/24 | HR 78 | Ht 65.0 in | Wt 142.5 lb

## 2023-12-23 DIAGNOSIS — I739 Peripheral vascular disease, unspecified: Secondary | ICD-10-CM

## 2023-12-23 NOTE — Assessment & Plan Note (Signed)
 Kerri Snyder  returns today for follow-up of PAD.  I last saw her 05/21/2023.  She was referred by Dr. Jeffrie.  She presently has left calf claudication.  She denies symptoms especially when she is at work.  Dopplers performed 01/18/2023 revealed a right ABI of 0.81, left of 0.74.  She did have a high-frequency signal in her left TPT.  She failed Pletal .  I am going to repeat her Doppler studies and she will think about whether she wants to proceed with angiography potential intervention.

## 2023-12-23 NOTE — Patient Instructions (Signed)
 Medication Instructions:  Your physician recommends that you continue on your current medications as directed. Please refer to the Current Medication list given to you today.  *If you need a refill on your cardiac medications before your next appointment, please call your pharmacy*   Testing/Procedures: Your physician has requested that you have a lower extremity arterial duplex. During this test, ultrasound is used to evaluate arterial blood flow in the legs. Allow one hour for this exam. There are no restrictions or special instructions. This will take place at [address], 4th floor  Please note: We ask at that you not bring children with you during ultrasound (echo/ vascular) testing. Due to room size and safety concerns, children are not allowed in the ultrasound rooms during exams. Our front office staff cannot provide observation of children in our lobby area while testing is being conducted. An adult accompanying a patient to their appointment will only be allowed in the ultrasound room at the discretion of the ultrasound technician under special circumstances. We apologize for any inconvenience.  Your physician has requested that you have an ankle brachial index (ABI). During this test an ultrasound and blood pressure cuff are used to evaluate the arteries that supply the arms and legs with blood. Allow thirty minutes for this exam. There are no restrictions or special instructions. This will take place at [address], 4th floor   Please note: We ask at that you not bring children with you during ultrasound (echo/ vascular) testing. Due to room size and safety concerns, children are not allowed in the ultrasound rooms during exams. Our front office staff cannot provide observation of children in our lobby area while testing is being conducted. An adult accompanying a patient to their appointment will only be allowed in the ultrasound room at the discretion of the ultrasound  technician under special circumstances. We apologize for any inconvenience.   Follow-Up: At [facility], you and your health needs are our priority.  As part of our continuing mission to provide you with exceptional heart care, our providers are all part of one team.  This team includes your primary Cardiologist (physician) and Advanced Practice Providers or APPs (Physician Assistants and Nurse Practitioners) who all work together to provide you with the care you need, when you need it.  Your next appointment:   6-8 week(s) (post dopplers)  Provider:   Dorn Lesches, MD

## 2023-12-23 NOTE — Progress Notes (Signed)
 12/23/2023 Kerri Snyder   Apr 16, 1940  [id number]  Primary Physician Claudene Pellet, MD Primary Cardiologist: Dorn [name] [name], [name]  HPI:  Kerri Snyder is a 83 y.o.  SABRA thin-appearing widowed Caucasian female mother of 1 son, grandmother of 3 grandchildren who continues to work at age 51 at the [employer] .  She was referred by Glendia Ferrier, PA-C for peripheral vascular evaluation because of lifestyle-limiting claudication.  Her primary cardiologist is Dr. Oneil Parchment.  I last saw her in the office 05/21/2023.  She does have a history of CAD status post LAD and RCA intervention in the past (4, 5/21).  She has heart failure with reduced EF and moderate to severe MR as well as history of hypertension, and hyperlipidemia.  She is legally blind.  She lives with her grandson and gets out of the house rarely.  She does say however that she is limited by claudication left greater than right for several years.  She did have Doppler studies performed 01/28/2023 revealing a right ABI of 0.81, left of 0.74.  She had moderate disease in her right popliteal artery and an occluded right PTA.  She had high-grade left tibioperoneal trunk stenosis and an occluded left posterior tibial artery.   Since I saw her in the office 6 months ago she continues to complain of left calf claudication.  She failed empiric Pletal  therapy.  Her Dopplers a year ago did show a left ABI of 0.74 with a high-frequency signal in her left TPT.  She she will think about whether she wants to proceed with outpatient peripheral angiography and potential endovascular therapy.     Current Meds  Medication Sig   ALPRAZolam (XANAX) 0.25 MG tablet Take 0.25 mg by mouth 2 (two) times daily. (Patient taking differently: Take 0.25 mg by mouth as needed for anxiety or sleep.)   Ascorbic Acid (VITAMIN C PO) Take 1 tablet by mouth every morning.   Cholecalciferol (VITAMIN D PO) Take 1 tablet by mouth  daily.    clopidogrel  (PLAVIX ) 75 MG tablet Take 1 tablet (75 mg total) by mouth daily with breakfast.   Evolocumab  (REPATHA  SURECLICK) 140 MG/ML SOAJ INJECT 140 MG INTO THE SKIN EVERY 14 (FOURTEEN) DAYS.   hydrochlorothiazide  (HYDRODIURIL ) 25 MG tablet Take 25 mg by mouth daily.   hydroxypropyl methylcellulose (ISOPTO TEARS) 2.5 % ophthalmic solution Place 1 drop into both eyes 4 (four) times daily as needed for dry eyes.   ketoconazole (NIZORAL) 2 % cream 1 application to affected area   LOSARTAN  POTASSIUM PO Take 25 mg by mouth daily at 6 (six) AM.   metoprolol  succinate (TOPROL -XL) 50 MG 24 hr tablet Take 1 tablet (50 mg total) by mouth daily. Take with or immediately following a meal.   nitroGLYCERIN  (NITROSTAT ) 0.4 MG SL tablet PLACE 1 TABLET UNDER THE TONGUE EVERY 5 MINUTES AS NEEDED FOR CHEST PAIN.   vitamin B-12 (CYANOCOBALAMIN) 1000 MCG tablet Take 1,000 mcg by mouth daily.     Allergies  Allergen Reactions   Pitavastatin     Other Reaction(s): severe leg cramps   Rosuvastatin      Other Reaction(s): leg pain and stomach upset    Social History   Socioeconomic History   Marital status: Married    Spouse name: Not on file   Number of children: Not on file   Years of education: Not on file   Highest education level: Not on file  Occupational History   Not on file  Tobacco Use   Smoking status: Never   Smokeless tobacco: Never  Vaping Use   Vaping status: Never Used  Substance and Sexual Activity   Alcohol use: No    Alcohol/week: 0.0 standard drinks of alcohol   Drug use: No   Sexual activity: Never  Other Topics Concern   Not on file  Social History Narrative   Not on file   Social Drivers of Health   Financial Resource Strain: Not on file  Food Insecurity: Not on file  Transportation Needs: Not on file  Physical Activity: Not on file  Stress: Not on file  Social Connections: Not on file  Intimate Partner Violence: Not on file     Review of  Systems: General: negative for chills, fever, night sweats or weight changes.  Cardiovascular: negative for chest pain, dyspnea on exertion, edema, orthopnea, palpitations, paroxysmal nocturnal dyspnea or shortness of breath Dermatological: negative for rash Respiratory: negative for cough or wheezing Urologic: negative for hematuria Abdominal: negative for nausea, vomiting, diarrhea, bright red blood per rectum, melena, or hematemesis Neurologic: negative for visual changes, syncope, or dizziness All other systems reviewed and are otherwise negative except as noted above.    Blood pressure (!) 120/24, pulse 78, height 5' 5 (1.651 m), weight 142 lb 8 oz (64.6 kg), SpO2 95%.  General appearance: alert and no distress Neck: no adenopathy, no carotid bruit, no JVD, supple, symmetrical, trachea midline, and thyroid not enlarged, symmetric, no tenderness/mass/nodules Lungs: clear to auscultation bilaterally Heart: regular rate and rhythm, S1, S2 normal, no murmur, click, rub or gallop Extremities: extremities normal, atraumatic, no cyanosis or edema Pulses: Diminished pedal pulses Skin: Skin color, texture, turgor normal. No rashes or lesions Neurologic: Grossly normal  EKG EKG Interpretation Date/Time:  Tuesday December 23 2023 08:39:44 EDT Ventricular Rate:  78 PR Interval:  152 QRS Duration:  94 QT Interval:  382 QTC Calculation: 435 R Axis:   26  Text Interpretation: Normal sinus rhythm Possible Inferior infarct (cited on or before 04-Dec-2022) Anterior infarct , age undetermined When compared with ECG of 19-Feb-2023 14:51, Anterior infarct is now Present Confirmed by Court Carrier [phone]) on 12/23/2023 8:41:48 AM    ASSESSMENT AND PLAN:   Claudication of calf muscles (HCC) [name]  returns today for follow-up of PAD.  I last saw her 05/21/2023.  She was referred by Dr. Jeffrie.  She presently has left calf claudication.  She denies symptoms especially when she is at work.   Dopplers performed 01/18/2023 revealed a right ABI of 0.81, left of 0.74.  She did have a high-frequency signal in her left TPT.  She failed Pletal .  I am going to repeat her Doppler studies and she will think about whether she wants to proceed with angiography potential intervention.     Carrier DOROTHA Court MD FACP,FACC,FAHA, [facility] 12/23/2023 9:03 AM

## 2024-01-03 ENCOUNTER — Encounter (HOSPITAL_COMMUNITY): Payer: Self-pay

## 2024-01-03 ENCOUNTER — Encounter (HOSPITAL_COMMUNITY): Payer: Self-pay | Admitting: Emergency Medicine

## 2024-01-03 ENCOUNTER — Other Ambulatory Visit: Payer: Self-pay

## 2024-01-03 ENCOUNTER — Ambulatory Visit (HOSPITAL_COMMUNITY)
Admission: EM | Admit: 2024-01-03 | Discharge: 2024-01-03 | Disposition: A | Discharge status: Home / Self Care | Attending: Family Medicine | Admitting: Family Medicine

## 2024-01-03 ENCOUNTER — Emergency Department (HOSPITAL_COMMUNITY)
Admission: EM | Admit: 2024-01-03 | Discharge: 2024-01-03 | Disposition: A | Discharge status: Home / Self Care | Attending: Emergency Medicine | Admitting: Emergency Medicine

## 2024-01-03 ENCOUNTER — Emergency Department (HOSPITAL_COMMUNITY)

## 2024-01-03 DIAGNOSIS — W19XXXA Unspecified fall, initial encounter: Secondary | ICD-10-CM

## 2024-01-03 DIAGNOSIS — Y9301 Activity, walking, marching and hiking: Secondary | ICD-10-CM | POA: Insufficient documentation

## 2024-01-03 DIAGNOSIS — Z7902 Long term (current) use of antithrombotics/antiplatelets: Secondary | ICD-10-CM | POA: Insufficient documentation

## 2024-01-03 DIAGNOSIS — Z7901 Long term (current) use of anticoagulants: Secondary | ICD-10-CM

## 2024-01-03 DIAGNOSIS — I10 Essential (primary) hypertension: Secondary | ICD-10-CM | POA: Insufficient documentation

## 2024-01-03 DIAGNOSIS — S0990XA Unspecified injury of head, initial encounter: Secondary | ICD-10-CM

## 2024-01-03 DIAGNOSIS — Z23 Encounter for immunization: Secondary | ICD-10-CM | POA: Diagnosis not present

## 2024-01-03 DIAGNOSIS — I2510 Atherosclerotic heart disease of native coronary artery without angina pectoris: Secondary | ICD-10-CM | POA: Insufficient documentation

## 2024-01-03 DIAGNOSIS — S0101XA Laceration without foreign body of scalp, initial encounter: Secondary | ICD-10-CM | POA: Diagnosis not present

## 2024-01-03 DIAGNOSIS — W108XXA Fall (on) (from) other stairs and steps, initial encounter: Secondary | ICD-10-CM | POA: Diagnosis not present

## 2024-01-03 DIAGNOSIS — Z79899 Other long term (current) drug therapy: Secondary | ICD-10-CM | POA: Diagnosis not present

## 2024-01-03 LAB — CBC WITH DIFFERENTIAL/PLATELET
Abs Immature Granulocytes: 0.03 K/uL (ref 0.00–0.07)
Basophils Absolute: 0 K/uL (ref 0.0–0.1)
Basophils Relative: 0 %
Eosinophils Absolute: 0.1 K/uL (ref 0.0–0.5)
Eosinophils Relative: 1 %
HCT: 42.4 % (ref 36.0–46.0)
Hemoglobin: 13.7 g/dL (ref 12.0–15.0)
Immature Granulocytes: 0 %
Lymphocytes Relative: 24 %
Lymphs Abs: 2.5 K/uL (ref 0.7–4.0)
MCH: 30.4 pg (ref 26.0–34.0)
MCHC: 32.3 g/dL (ref 30.0–36.0)
MCV: 94.2 fL (ref 80.0–100.0)
Monocytes Absolute: 1.2 K/uL — ABNORMAL HIGH (ref 0.1–1.0)
Monocytes Relative: 11 %
Neutro Abs: 6.6 K/uL (ref 1.7–7.7)
Neutrophils Relative %: 64 %
Platelets: 284 K/uL (ref 150–400)
RBC: 4.5 MIL/uL (ref 3.87–5.11)
RDW: 13.2 % (ref 11.5–15.5)
WBC: 10.5 K/uL (ref 4.0–10.5)
nRBC: 0 % (ref 0.0–0.2)

## 2024-01-03 LAB — COMPREHENSIVE METABOLIC PANEL WITH GFR
ALT: 15 U/L (ref 0–44)
AST: 22 U/L (ref 15–41)
Albumin: 4 g/dL (ref 3.5–5.0)
Alkaline Phosphatase: 62 U/L (ref 38–126)
Anion gap: 15 (ref 5–15)
BUN: 26 mg/dL — ABNORMAL HIGH (ref 8–23)
CO2: 24 mmol/L (ref 22–32)
Calcium: 9.6 mg/dL (ref 8.9–10.3)
Chloride: 99 mmol/L (ref 98–111)
Creatinine, Ser: 1.05 mg/dL — ABNORMAL HIGH (ref 0.44–1.00)
GFR, Estimated: 52 mL/min — ABNORMAL LOW (ref >60)
Glucose, Bld: 107 mg/dL — ABNORMAL HIGH (ref 70–99)
Potassium: 4 mmol/L (ref 3.5–5.1)
Sodium: 138 mmol/L (ref 135–145)
Total Bilirubin: 0.9 mg/dL (ref 0.0–1.2)
Total Protein: 7.2 g/dL (ref 6.5–8.1)

## 2024-01-03 LAB — I-STAT CHEM 8, ED
BUN: 30 mg/dL — ABNORMAL HIGH (ref 8–23)
Calcium, Ion: 1.16 mmol/L (ref 1.15–1.40)
Chloride: 104 mmol/L (ref 98–111)
Creatinine, Ser: 1.1 mg/dL — ABNORMAL HIGH (ref 0.44–1.00)
Glucose, Bld: 103 mg/dL — ABNORMAL HIGH (ref 70–99)
HCT: 42 % (ref 36.0–46.0)
Hemoglobin: 14.3 g/dL (ref 12.0–15.0)
Potassium: 4 mmol/L (ref 3.5–5.1)
Sodium: 140 mmol/L (ref 135–145)
TCO2: 25 mmol/L (ref 22–32)

## 2024-01-03 MED ORDER — LIDOCAINE-EPINEPHRINE (PF) 2 %-1:200000 IJ SOLN
10.0000 mL | Freq: Once | INTRAMUSCULAR | Status: DC
  Filled 2024-01-03: qty 20

## 2024-01-03 MED ORDER — TETANUS-DIPHTH-ACELL PERTUSSIS 5-2.5-18.5 LF-MCG/0.5 IM SUSY
0.5000 mL | PREFILLED_SYRINGE | Freq: Once | INTRAMUSCULAR | Status: AC
  Administered 2024-01-03: 0.5 mL via INTRAMUSCULAR
  Filled 2024-01-03: qty 0.5

## 2024-01-03 NOTE — ED Triage Notes (Addendum)
 The patient reports falling this morning around 0900 and hitting the back left side of her head. The pt denies any dizziness, lightheadedness, and the patient denies chest pain. Patient reports having a headache and that she is on plavix . Patient has a laceration to the left back side of her head that is not actively bleeding at this time (area cleansed). Patient is A&O x 4.

## 2024-01-03 NOTE — ED Provider Notes (Signed)
 [place] EMERGENCY DEPARTMENT AT [facility] Provider Note   CSN: [id number] Arrival date & time: 01/03/24  1048     Patient presents with: No chief complaint on file.   Kerri Snyder is a 83 y.o. female patient with past medical history of CVA, hypertension, hyperlipidemia, coronary artery disease, degenerative disc disease presents to emergency room after having fall.  Patient reports that she was walking down her stairs of the door when she missed the railing and fell backwards hitting the posterior aspect of her head.  She denies loss of consciousness.  She does have mild headache.  She does take Plavix .  She denies any neck pain.  She sustained no other injuries during fall.  She has been able to ambulate with steady gait. Sent from UC due to fall and Plavic use.   HPI     Prior to Admission medications   Medication Sig Start Date End Date Taking? Authorizing Provider  ALPRAZolam (XANAX) 0.25 MG tablet Take 0.25 mg by mouth 2 (two) times daily. Patient taking differently: Take 0.25 mg by mouth as needed for anxiety or sleep. 01/26/21   [provider]  Ascorbic Acid (VITAMIN C PO) Take 1 tablet by mouth every morning.    [provider]  Cholecalciferol (VITAMIN D PO) Take 1 tablet by mouth daily.     [provider]  clopidogrel  (PLAVIX ) 75 MG tablet Take 1 tablet (75 mg total) by mouth daily with breakfast. 06/17/13   Regalado, Belkys A, MD  Evolocumab  (REPATHA  SURECLICK) 140 MG/ML SOAJ INJECT 140 MG INTO THE SKIN EVERY 14 (FOURTEEN) DAYS. 11/04/22   Jeffrie Oneil BROCKS, MD  hydrochlorothiazide  (HYDRODIURIL ) 25 MG tablet Take 25 mg by mouth daily.    [provider]  hydroxypropyl methylcellulose (ISOPTO TEARS) 2.5 % ophthalmic solution Place 1 drop into both eyes 4 (four) times daily as needed for dry eyes.    [provider]  isosorbide  mononitrate (IMDUR ) 60 MG 24 hr tablet Take 1.5 tablets (90 mg total) by mouth daily. Patient  not taking: Reported on 12/23/2023 01/20/23 04/20/23  Lelon Hamilton T, PA-C  ketoconazole (NIZORAL) 2 % cream 1 application to affected area 12/27/09   [provider]  LOSARTAN  POTASSIUM PO Take 25 mg by mouth daily at 6 (six) AM.    [provider]  metoprolol  succinate (TOPROL -XL) 50 MG 24 hr tablet TAKE 1 TABLET BY MOUTH DAILY. TAKE WITH OR IMMEDIATELY FOLLOWING A MEAL. 12/24/23   Weaver, Scott T, PA-C  nitroGLYCERIN  (NITROSTAT ) 0.4 MG SL tablet PLACE 1 TABLET UNDER THE TONGUE EVERY 5 MINUTES AS NEEDED FOR CHEST PAIN. 12/02/23   Jeffrie Oneil BROCKS, MD  vitamin B-12 (CYANOCOBALAMIN) 1000 MCG tablet Take 1,000 mcg by mouth daily.    [provider]    Allergies: Pitavastatin and Rosuvastatin     Review of Systems  Skin:  Positive for wound.    Updated Vital Signs BP (!) 157/78   Pulse 100   Temp 97.9 F (36.6 C)   Resp 18   Ht 5' 5 (1.651 m)   Wt 64.6 kg   SpO2 98%   BMI 23.70 kg/m   Physical Exam Vitals and nursing note reviewed.  Constitutional:      General: She is not in acute distress.    Appearance: She is not toxic-appearing.  HENT:     Head: Normocephalic and atraumatic.  Eyes:     General: No scleral icterus.    Conjunctiva/sclera: Conjunctivae normal.  Cardiovascular:     Rate and Rhythm: Normal rate and regular rhythm.     Pulses: Normal pulses.     Heart sounds: Normal heart sounds.  Pulmonary:     Effort: Pulmonary effort is normal. No respiratory distress.     Breath sounds: Normal breath sounds.  Abdominal:     General: Abdomen is flat. Bowel sounds are normal.     Palpations: Abdomen is soft.     Tenderness: There is no abdominal tenderness.  Musculoskeletal:     Right lower leg: No edema.     Left lower leg: No edema.  Skin:    General: Skin is warm and dry.     Findings: No lesion.     Comments: Lac 2cm to posterior head.   Neurological:     General: No focal deficit present.     Mental Status: She is alert and oriented to  person, place, and time. Mental status is at baseline.     (all labs ordered are listed, but only abnormal results are displayed) Labs Reviewed  CBC WITH DIFFERENTIAL/PLATELET - Abnormal; Notable for the following components:      Result Value   Monocytes Absolute 1.2 (*)    All other components within normal limits  COMPREHENSIVE METABOLIC PANEL WITH GFR - Abnormal; Notable for the following components:   Glucose, Bld 107 (*)    BUN 26 (*)    Creatinine, Ser 1.05 (*)    GFR, Estimated 52 (*)    All other components within normal limits  I-STAT CHEM 8, ED - Abnormal; Notable for the following components:   BUN 30 (*)    Creatinine, Ser 1.10 (*)    Glucose, Bld 103 (*)    All other components within normal limits    EKG: None  Radiology: CT Head Wo Contrast Result Date: 01/03/2024 CLINICAL DATA:  Blunt head and neck trauma. On anticoagulation. Pain. EXAM: CT HEAD WITHOUT CONTRAST CT CERVICAL SPINE WITHOUT CONTRAST TECHNIQUE: Multidetector CT imaging of the head and cervical spine was performed following the standard protocol without intravenous contrast. Multiplanar CT image reconstructions of the cervical spine were also generated. RADIATION DOSE REDUCTION: This exam was performed according to the departmental dose-optimization program which includes automated exposure control, adjustment of the mA and/or kV according to patient size and/or use of iterative reconstruction technique. COMPARISON:  Head only CT on 06/14/2013 in FINDINGS: CT HEAD FINDINGS Brain: No evidence of intracranial hemorrhage, acute infarction, hydrocephalus, extra-axial collection, or mass lesion/mass effect. Mild chronic small vessel disease noted. Vascular:  No hyperdense vessel or other acute findings. Skull: No evidence of fracture or other significant bone abnormality. Sinuses/Orbits:  No acute findings. Other: None. CT CERVICAL SPINE FINDINGS Alignment: Grade 1 degenerative anterolisthesis noted at C4-5. No  evidence of traumatic listhesis. Skull base and vertebrae: No acute fracture. No primary bone lesion or focal pathologic process. Soft tissues and spinal canal: No prevertebral fluid or swelling. No visible canal hematoma. Disc levels: Moderate to severe degenerative disc disease at C5-6 and C6-7. Mild degenerative disc disease at C4-5. Mild-to-moderate facet DJD seen bilaterally from levels C3 to C6. Atlantoaxial degenerative changes also noted. Upper chest: No acute findings. Other: None. IMPRESSION: No acute intracranial abnormality. Mild chronic small vessel disease. No evidence of acute cervical spine fracture or traumatic listhesis. Degenerative spondylosis, as described above. Electronically Signed   By: Norleen DELENA Kil M.D.   On: 01/03/2024 11:39   CT Cervical Spine Wo Contrast Result Date: 01/03/2024 CLINICAL DATA:  Blunt head and neck trauma. On anticoagulation. Pain. EXAM: CT HEAD WITHOUT CONTRAST CT CERVICAL SPINE WITHOUT CONTRAST TECHNIQUE: Multidetector CT imaging of the head and cervical spine was performed following the standard protocol without intravenous contrast. Multiplanar CT image reconstructions of the cervical spine were also generated. RADIATION DOSE REDUCTION: This exam was performed according to the departmental dose-optimization program which includes automated exposure control, adjustment of the mA and/or kV according to patient size and/or use of iterative reconstruction technique. COMPARISON:  Head only CT on 06/14/2013 in FINDINGS: CT HEAD FINDINGS Brain: No evidence of intracranial hemorrhage, acute infarction, hydrocephalus, extra-axial collection, or mass lesion/mass effect. Mild chronic small vessel disease noted. Vascular:  No hyperdense vessel or other acute findings. Skull: No evidence of fracture or other significant bone abnormality. Sinuses/Orbits:  No acute findings. Other: None. CT CERVICAL SPINE FINDINGS Alignment: Grade 1 degenerative anterolisthesis noted at C4-5. No  evidence of traumatic listhesis. Skull base and vertebrae: No acute fracture. No primary bone lesion or focal pathologic process. Soft tissues and spinal canal: No prevertebral fluid or swelling. No visible canal hematoma. Disc levels: Moderate to severe degenerative disc disease at C5-6 and C6-7. Mild degenerative disc disease at C4-5. Mild-to-moderate facet DJD seen bilaterally from levels C3 to C6. Atlantoaxial degenerative changes also noted. Upper chest: No acute findings. Other: None. IMPRESSION: No acute intracranial abnormality. Mild chronic small vessel disease. No evidence of acute cervical spine fracture or traumatic listhesis. Degenerative spondylosis, as described above. Electronically Signed   By: Norleen DELENA Kil M.D.   On: 01/03/2024 11:39     .Laceration Repair  Date/Time: 01/03/2024 12:13 PM  Performed by: Shermon Warren SAILOR, PA-C Authorized by: Shermon Warren SAILOR, PA-C   Consent:    Consent obtained:  Verbal   Consent given by:  Patient   Risks, benefits, and alternatives were discussed: yes     Risks discussed:  Infection, need for additional repair, nerve damage, poor wound healing, poor cosmetic result, pain, retained foreign body, tendon damage and vascular damage   Alternatives discussed:  Referral, observation, no treatment and delayed treatment Universal protocol:    Procedure explained and questions answered to patient or proxy's satisfaction: yes     Relevant documents present and verified: yes     Test results available: yes     Imaging studies available: yes     Required blood products, implants, devices, and special equipment available: yes     Site/side marked: yes     Immediately prior to procedure, a time out was called: yes     Patient identity confirmed:  Verbally with patient Anesthesia:    Anesthesia method:  None Laceration details:    Location:  Scalp   Scalp location:  Crown   Length (cm):  2 Treatment:    Area cleansed with:  Povidone-iodine    Irrigation method:  Pressure wash Skin repair:    Repair method:  Staples   Number of staples:  2 Approximation:    Approximation:  Close Repair type:    Repair type:  Simple Post-procedure details:    Dressing:  Open (no dressing)   Procedure completion:  Tolerated    Medications Ordered in the ED  Tdap (BOOSTRIX ) injection 0.5 mL (has no administration in time range)  lidocaine -EPINEPHrine  (XYLOCAINE  W/EPI) 2 %-1:200000 (PF) injection 10 mL (has no administration in time range)  Medical Decision Making Amount and/or Complexity of Data Reviewed Labs: ordered. Radiology: ordered.  Risk Prescription drug management.   This patient presents to the ED for concern of fall, this involves an extensive number of treatment options, and is a complaint that carries with it a high risk of complications and morbidity.  The differential diagnosis includes intracranial hemorrhage, subdural/epidural hematoma, vertebral fracture, spinal cord injury, muscle strain, skull fracture, fracture, splenic injury, liver injury, perforated viscus, contusions.   Lab Tests:  I personally interpreted labs.  The pertinent results include:   CBC, CMP   Imaging Studies ordered:  I ordered imaging studies including Ct head, cervical spine.   I independently visualized and interpreted imaging which showed no acute findings.  I agree with the radiologist interpretation   Cardiac Monitoring: / EKG:  The patient was maintained on a cardiac monitor.     Problem List / ED Course / Critical interventions / Medication management  Patient reports to emergency room with complaint of fall.  Patient reports mechanical fall in which she lost her balance walking down steps and hit the posterior aspect of her head.  When she fell she did not have loss of consciousness.  She has not had seizure or altered mental status.  No repetitive episodes of vomiting.  She has no numbness  or tingling in extremities.  She is able to ambulate with steady gait.  On arrival she is hemodynamically stable and she is well-appearing.  She does have laceration to posterior aspect of head that is small and nonbleeding.  Obtain head CT as well as cervical spine CT that showed no acute findings.  Her CBC and CMP are overall unremarkable here.  Laceration was repaired without any difficulty. I ordered medication including update tdap.  Reevaluation of the patient after these medicines showed that the patient improved I have reviewed the patients home medicines and have made adjustments as needed. Hemodynamically stable. Well appearing.  Reassuring workup.  Feel stable for discharge with outpatient follow-up.        Final diagnoses:  Fall, initial encounter  Laceration of scalp, initial encounter    ED Discharge Orders     None          Shermon Warren LOISE RIGGERS 01/03/24 1213    Garrick Charleston, MD 01/05/24 1614

## 2024-01-03 NOTE — ED Notes (Signed)
 CCMD called, pt on monitor

## 2024-01-03 NOTE — Discharge Instructions (Signed)
 Would recommend ice or Tylenol  for pain control.  You had 2 staples placed today.  You will need to get them removed in 7 to 10 days.  For any new or worsening symptoms please return to emergency room

## 2024-01-03 NOTE — ED Notes (Signed)
 Trauma Response Nurse Documentation   Kerri Snyder is a 83 y.o. female arriving to [facility] ED via POV  On clopidogrel  75 mg daily. Trauma was activated as a Level 2 by ED Charge RN based on the following trauma criteria Elderly patients > 65 with head trauma on anti-coagulation (excluding ASA).  Patient cleared for CT by Dr. Garrick. Pt transported to CT with trauma response nurse present to monitor. RN remained with the patient throughout their absence from the department for clinical observation.   GCS 15.  History   Past Medical History:  Diagnosis Date   Albinism (HCC)    Anxiety disorder, unspecified    Blind    legally blind   Carotid artery occlusion    Cerebrovascular disease, unspecified    Coronary artery disease    DDD (degenerative disc disease), lumbar    Diverticulitis large intestine w/o perforation or abscess w/o bleeding    Exercise-induced angina (HCC)    Family history of anesthesia complication    cousin with nausea and vomiting post anesthesia   First degree hemorrhoids    Hereditary and idiopathic neuropathy, unspecified    Hyperlipidemia    Hypertension    Osteopenia    Peripheral vascular disease (HCC) 02/27/2021   ABIs/Arterial US  01/29/23: R 0.81, L 0.74 // R SFA/AK Pop 50-74; L BK Pop/Prox Tib-Peroneal Trunk 75-99      PFO (patent foramen ovale)    Sciatica    Stable angina (HCC)    Stroke (HCC) 06/2013     Past Surgical History:  Procedure Laterality Date   CAROTID ENDARTERECTOMY Left June 25, 2013   cea   CAROTID ENDARTERECTOMY Right August 06, 2013   cea   CESAREAN SECTION     CORONARY ATHERECTOMY N/A 09/09/2019   Procedure: CORONARY ATHERECTOMY;  Surgeon: Dann Candyce RAMAN, MD;  Location: [facility] INVASIVE CV LAB;  Service: Cardiovascular;  Laterality: N/A;   CORONARY STENT INTERVENTION N/A 08/10/2019   Procedure: CORONARY STENT INTERVENTION;  Surgeon: Dann Candyce RAMAN, MD;  Location: [facility] INVASIVE CV LAB;  Service: Cardiovascular;   Laterality: N/A;   CORONARY STENT INTERVENTION  09/09/2019   CORONARY STENT INTERVENTION N/A 09/09/2019   Procedure: CORONARY STENT INTERVENTION;  Surgeon: Dann Candyce RAMAN, MD;  Location: [facility] INVASIVE CV LAB;  Service: Cardiovascular;  Laterality: N/A;   CORONARY ULTRASOUND/IVUS N/A 08/10/2019   Procedure: Intravascular Ultrasound/IVUS;  Surgeon: Dann Candyce RAMAN, MD;  Location: [facility] INVASIVE CV LAB;  Service: Cardiovascular;  Laterality: N/A;   CORONARY ULTRASOUND/IVUS N/A 09/09/2019   Procedure: Intravascular Ultrasound/IVUS;  Surgeon: Dann Candyce RAMAN, MD;  Location: [facility] INVASIVE CV LAB;  Service: Cardiovascular;  Laterality: N/A;   Ear drum replacement 2000 Right 2000   ENDARTERECTOMY Left 06/25/2013   Procedure: ENDARTERECTOMY CAROTID;  Surgeon: Krystal JULIANNA Doing, MD;  Location: [facility] OR;  Service: Vascular;  Laterality: Left;   ENDARTERECTOMY Right 08/06/2013   Procedure:  Carotid Endarterectomy with hemashield patch angioplasty;  Surgeon: Krystal JULIANNA Doing, MD;  Location: [facility] OR;  Service: Vascular;  Laterality: Right;   LEFT HEART CATH AND CORONARY ANGIOGRAPHY N/A 08/10/2019   Procedure: LEFT HEART CATH AND CORONARY ANGIOGRAPHY;  Surgeon: Dann Candyce RAMAN, MD;  Location: [facility] INVASIVE CV LAB;  Service: Cardiovascular;  Laterality: N/A;   LEFT HEART CATH AND CORONARY ANGIOGRAPHY N/A 09/09/2019   Procedure: LEFT HEART CATH AND CORONARY ANGIOGRAPHY;  Surgeon: Dann Candyce RAMAN, MD;  Location: [facility] INVASIVE CV LAB;  Service: Cardiovascular;  Laterality: N/A;   TEE WITHOUT CARDIOVERSION  N/A 06/17/2013   Procedure: TRANSESOPHAGEAL ECHOCARDIOGRAM (TEE);  Surgeon: Aleene JINNY Passe, MD;  Location: [facility] ENDOSCOPY;  Service: Cardiovascular;  Laterality: N/A;   TONSILLECTOMY     TUBAL LIGATION       Initial Focused Assessment (If applicable, or please see trauma documentation): Airway: Intact, patent Breathing: Breath sounds clear, equal bilaterally. No CP and no SOB. Circulation: Approx 1cm laceration to  posterior left head. Bleeding controlled. Dried blood noted to back of head and had dripped onto clothing.  No other signs of trauma externally. Pulses intact centrally and peripherally. Hypertension noted. Pt does take plavix  due to previous stroke (took this morning), and also takes antihypertensives (takes at night-time).  Disability: PERRLA 2's. MAE equally with equal sensation throughout. No neck pain. Pt had no LOC.  CT's Completed:   CT Head and CT C-Spine   Interventions:  20G PIV to R AC Labs drawn Tdap given CT head and C-Spine Undressed and assessed thoroughly Warm blankets applied  Plan for disposition:  Other Awaiting results and lac repair  Consults completed:  none at 1130.  Event Summary: Pt came in POV brought in by grandson. Pt reports that she was leaving her house this morning to go get groceries when she missed grabbing the side of the door. Pt then fell backwards and hit the back of her head on the door.  Small laceration to back of left side of head.  No LOC.  Pt has no other injuries. Only c/o mild HA.  Pt on plavix  for previous stroke and did take her dose this morning.  Pt is also on antihypertensive medication and only takes this in the evening.   Bedside handoff with ED RN Luster and Ester, Paramedic.    LEBRON ROCKIE ORN  Trauma Response RN  Please call TRN at [phone] for further assistance.

## 2024-01-03 NOTE — Progress Notes (Signed)
   01/03/24 1117  Spiritual Encounters  Type of Visit Initial  Care provided to: Family  Referral source Trauma page  Reason for visit Trauma  OnCall Visit Yes   Chaplain responded to a trauma in the ED - level II, fall on blood thinners. Chaplain met with patient's grandson. Chaplain introduced spiritual care services. Chaplain extended hospitality.Spiritual care services available as needed.   Juliene CHRISTELLA Das, Chaplain 01/03/24

## 2024-01-03 NOTE — ED Notes (Signed)
 Patient is being discharged from the Urgent Care and sent to the Emergency Department via POV with grandson. Per Dr. Rolinda, patient is in need of higher level of care due to fall around 0900 today and head laceration. Patient/ and patients grandson are aware and verbalizes understanding of plan of care.  Vitals:   01/03/24 1015  BP: 133/80  Pulse: 94  Resp: 16  Temp: 98 F (36.7 C)  SpO2: 94%

## 2024-01-03 NOTE — ED Provider Notes (Signed)
 [facility]   [id number] 01/03/24 Arrival Time: 1007  ASSESSMENT & PLAN:  1. Acute head injury without loss of consciousness, initial encounter   2. Hx of long term use of blood thinners   3. Scalp laceration, initial encounter    Without acute neurological changes on exam. Is alert and oriented. Head injury sustained approx 1.5 hours ago. Recommend ED evaluation given and being on blood thinners. Scalp is not bleeding at this time. Pt is ambulatory. She agrees. Declines ambulance transport. Her grandson will take her to ED via  POV; stable upon discharge.  Recommend:  Follow-up Information     Go to  [facility] at [facility].   Specialty: Emergency Medicine Contact information: [address] [place]  [address] [phone]                 Reviewed expectations re: course of current medical issues. Questions answered. Outlined signs and symptoms indicating need for more acute intervention. Patient verbalized understanding. After Visit Summary given.   SUBJECTIVE: History from: Patient and grandson. Patient is able to give a clear and coherent history.  Kerri Snyder is a 83 y.o. female with multiple medical problems who reports falling this morning at home; approx 1.5 hours ago; fell backward and hit head on door with resulting scalp laceration; bleeding controlled. Denies LOC/n/v. Denies extremity sensation changes or weakness. Darden has brought her here. He denies any mental status changes. Pt is on Plavix  and takes as directed..   OBJECTIVE:  Vitals:   01/03/24 1015  BP: 133/80  Pulse: 94  Resp: 16  Temp: 98 F (36.7 C)  TempSrc: Oral  SpO2: 94%    General appearance: alert; NAD HENT: normocephalic; occipital scalp laceration present without active bleeding Eyes: PERRLA; EOMI; conjunctivae normal Neck: supple with FROM Lungs: clear to auscultation bilaterally; unlabored  respirations Heart: regular Extremities: no edema; symmetrical with no gross deformities Skin: warm and dry Neurologic: alert; speech is fluent and clear without dysarthria or aphasia; CN 2-12 grossly intact; no facial droop; normal gait Psychological: alert and cooperative; normal mood and affect    Allergies  Allergen Reactions   Pitavastatin     Other Reaction(s): severe leg cramps   Rosuvastatin      Other Reaction(s): leg pain and stomach upset    Past Medical History:  Diagnosis Date   Albinism (HCC)    Anxiety disorder, unspecified    Blind    legally blind   Carotid artery occlusion    Cerebrovascular disease, unspecified    Coronary artery disease    DDD (degenerative disc disease), lumbar    Diverticulitis large intestine w/o perforation or abscess w/o bleeding    Exercise-induced angina (HCC)    Family history of anesthesia complication    cousin with nausea and vomiting post anesthesia   First degree hemorrhoids    Hereditary and idiopathic neuropathy, unspecified    Hyperlipidemia    Hypertension    Osteopenia    Peripheral vascular disease (HCC) 02/27/2021   ABIs/Arterial US  01/29/23: R 0.81, L 0.74 // R SFA/AK Pop 50-74; L BK Pop/Prox Tib-Peroneal Trunk 75-99      PFO (patent foramen ovale)    Sciatica    Stable angina (HCC)    Stroke (HCC) 06/2013   Social History   Socioeconomic History   Marital status: Married    Spouse name: Not on file   Number of children: Not on file   Years of education:  Not on file   Highest education level: Not on file  Occupational History   Not on file  Tobacco Use   Smoking status: Never   Smokeless tobacco: Never  Vaping Use   Vaping status: Never Used  Substance and Sexual Activity   Alcohol use: No    Alcohol/week: 0.0 standard drinks of alcohol   Drug use: No   Sexual activity: Never  Other Topics Concern   Not on file  Social History Narrative   Not on file   Social Drivers of Health   Financial  Resource Strain: Not on file  Food Insecurity: Not on file  Transportation Needs: Not on file  Physical Activity: Not on file  Stress: Not on file  Social Connections: Not on file  Intimate Partner Violence: Not on file   Family History  Problem Relation Age of Onset   Diabetes Father    Heart disease Father        Before age 52   Alcohol abuse Father    Alzheimer's disease Mother    Past Surgical History:  Procedure Laterality Date   CAROTID ENDARTERECTOMY Left June 25, 2013   cea   CAROTID ENDARTERECTOMY Right August 06, 2013   cea   CESAREAN SECTION     CORONARY ATHERECTOMY N/A 09/09/2019   Procedure: CORONARY ATHERECTOMY;  Surgeon: Dann Candyce RAMAN, MD;  Location: [facility] INVASIVE CV LAB;  Service: Cardiovascular;  Laterality: N/A;   CORONARY STENT INTERVENTION N/A 08/10/2019   Procedure: CORONARY STENT INTERVENTION;  Surgeon: Dann Candyce RAMAN, MD;  Location: [facility] INVASIVE CV LAB;  Service: Cardiovascular;  Laterality: N/A;   CORONARY STENT INTERVENTION  09/09/2019   CORONARY STENT INTERVENTION N/A 09/09/2019   Procedure: CORONARY STENT INTERVENTION;  Surgeon: Dann Candyce RAMAN, MD;  Location: [facility] INVASIVE CV LAB;  Service: Cardiovascular;  Laterality: N/A;   CORONARY ULTRASOUND/IVUS N/A 08/10/2019   Procedure: Intravascular Ultrasound/IVUS;  Surgeon: Dann Candyce RAMAN, MD;  Location: [facility] INVASIVE CV LAB;  Service: Cardiovascular;  Laterality: N/A;   CORONARY ULTRASOUND/IVUS N/A 09/09/2019   Procedure: Intravascular Ultrasound/IVUS;  Surgeon: Dann Candyce RAMAN, MD;  Location: [facility] INVASIVE CV LAB;  Service: Cardiovascular;  Laterality: N/A;   Ear drum replacement 2000 Right 2000   ENDARTERECTOMY Left 06/25/2013   Procedure: ENDARTERECTOMY CAROTID;  Surgeon: Krystal JULIANNA Doing, MD;  Location: [facility] OR;  Service: Vascular;  Laterality: Left;   ENDARTERECTOMY Right 08/06/2013   Procedure:  Carotid Endarterectomy with hemashield patch angioplasty;  Surgeon: Krystal JULIANNA Doing, MD;  Location: [facility]  OR;  Service: Vascular;  Laterality: Right;   LEFT HEART CATH AND CORONARY ANGIOGRAPHY N/A 08/10/2019   Procedure: LEFT HEART CATH AND CORONARY ANGIOGRAPHY;  Surgeon: Dann Candyce RAMAN, MD;  Location: [facility] INVASIVE CV LAB;  Service: Cardiovascular;  Laterality: N/A;   LEFT HEART CATH AND CORONARY ANGIOGRAPHY N/A 09/09/2019   Procedure: LEFT HEART CATH AND CORONARY ANGIOGRAPHY;  Surgeon: Dann Candyce RAMAN, MD;  Location: [facility] INVASIVE CV LAB;  Service: Cardiovascular;  Laterality: N/A;   TEE WITHOUT CARDIOVERSION N/A 06/17/2013   Procedure: TRANSESOPHAGEAL ECHOCARDIOGRAM (TEE);  Surgeon: Aleene JINNY Passe, MD;  Location: [facility] ENDOSCOPY;  Service: Cardiovascular;  Laterality: N/A;   TONSILLECTOMY     TUBAL LINNA Rolinda Rogue, MD 01/03/24 1030

## 2024-01-03 NOTE — ED Triage Notes (Signed)
 Pt sent from UC after falling. Pt was walking out the door and missed grabbing the side of door. Pt fell and hit back of head. Laceration to back of head. Pt last took plavix  this morning. Denies any other complaints.

## 2024-01-27 ENCOUNTER — Ambulatory Visit (HOSPITAL_BASED_OUTPATIENT_CLINIC_OR_DEPARTMENT_OTHER)
Admission: RE | Admit: 2024-01-27 | Discharge: 2024-01-27 | Disposition: A | Discharge status: Home / Self Care | Source: Ambulatory Visit | Attending: Cardiovascular Disease | Admitting: Cardiovascular Disease

## 2024-01-27 ENCOUNTER — Ambulatory Visit (HOSPITAL_COMMUNITY)
Admission: RE | Admit: 2024-01-27 | Discharge: 2024-01-27 | Disposition: A | Discharge status: Home / Self Care | Source: Ambulatory Visit | Attending: Cardiovascular Disease | Admitting: Cardiovascular Disease

## 2024-01-27 DIAGNOSIS — I739 Peripheral vascular disease, unspecified: Secondary | ICD-10-CM

## 2024-01-28 ENCOUNTER — Ambulatory Visit: Payer: Self-pay | Admitting: Cardiovascular Disease

## 2024-01-28 LAB — VAS US ABI WITH/WO TBI
Left ABI: 0.52
Right ABI: 0.98

## 2024-02-02 ENCOUNTER — Ambulatory Visit: Attending: Cardiovascular Disease | Admitting: Cardiovascular Disease

## 2024-02-02 ENCOUNTER — Encounter: Payer: Self-pay | Admitting: Cardiovascular Disease

## 2024-02-02 VITALS — BP 130/50 | HR 71 | Ht 61.0 in | Wt 144.0 lb

## 2024-02-02 DIAGNOSIS — I739 Peripheral vascular disease, unspecified: Secondary | ICD-10-CM

## 2024-02-02 NOTE — Patient Instructions (Signed)
 Medication Instructions:  Your physician recommends that you continue on your current medications as directed. Please refer to the Current Medication list given to you today.  *If you need a refill on your cardiac medications before your next appointment, please call your pharmacy*  Lab Work: Your physician recommends that you have labs drawn today: BMET & CBC  If you have labs (blood work) drawn today and your tests are completely normal, you will receive your results only by: MyChart Message (if you have MyChart) OR A paper copy in the mail If you have any lab test that is abnormal or we need to change your treatment, we will call you to review the results.  Testing/Procedures: Your physician has requested that you have a lower extremity arterial duplex. During this test, ultrasound is used to evaluate arterial blood flow in the legs. Allow one hour for this exam. There are no restrictions or special instructions. This will take place at [address], 4th floor  **To be done 1-2 weeks after your procedure (11/13)**  Please note: We ask at that you not bring children with you during ultrasound (echo/ vascular) testing. Due to room size and safety concerns, children are not allowed in the ultrasound rooms during exams. Our front office staff cannot provide observation of children in our lobby area while testing is being conducted. An adult accompanying a patient to their appointment will only be allowed in the ultrasound room at the discretion of the ultrasound technician under special circumstances. We apologize for any inconvenience.  Your physician has requested that you have an ankle brachial index (ABI). During this test an ultrasound and blood pressure cuff are used to evaluate the arteries that supply the arms and legs with blood. Allow thirty minutes for this exam. There are no restrictions or special instructions. This will take place at [address] 4th floor  **To be done 1-2  weeks after your procedure (11/13)**   Please note: We ask at that you not bring children with you during ultrasound (echo/ vascular) testing. Due to room size and safety concerns, children are not allowed in the ultrasound rooms during exams. Our front office staff cannot provide observation of children in our lobby area while testing is being conducted. An adult accompanying a patient to their appointment will only be allowed in the ultrasound room at the discretion of the ultrasound technician under special circumstances. We apologize for any inconvenience.   Follow-Up: At [facility], you and your health needs are our priority.  As part of our continuing mission to provide you with exceptional heart care, our providers are all part of one team.  This team includes your primary Cardiologist (physician) and Advanced Practice Providers or APPs (Physician Assistants and Nurse Practitioners) who all work together to provide you with the care you need, when you need it.  Your next appointment:   2-3 week(s) after your procedure (11/13) (post dopplers)  Provider:   Dorn Lesches, MD    We recommend signing up for the patient portal called MyChart.  Sign up information is provided on this After Visit Summary.  MyChart is used to connect with patients for Virtual Visits (Telemedicine).  Patients are able to view lab/test results, encounter notes, upcoming appointments, etc.  Non-urgent messages can be sent to your provider as well.   To learn more about what you can do with MyChart, go to [email or web address].   Other Instructions        Cardiac/Peripheral Catheterization  You are scheduled for a Peripheral Angiogram on Thursday, November 13 with Dr. Dorn Lesches.  1. Please arrive at the [facility] (Main Entrance A) at [facility]: [address] [place], [place] [address] at 5:30 AM (This time is 2 hour(s) before your procedure to ensure your preparation).    Free valet parking service is available. You will check in at ADMITTING. The support person will be asked to wait in the waiting room.  It is OK to have someone drop you off and come back when you are ready to be discharged.        Special note: Every effort is made to have your procedure done on time. Please understand that emergencies sometimes delay scheduled procedures.  2. Diet: Nothing to eat after midnight.  3. Hydration:You need to be well hydrated before your procedure. On November 13, you may drink approved liquids (see below) until 2 hours before the procedure, with 16 oz of water  as your last intake.   List of approved liquids water , clear juice, clear tea, black coffee, fruit juices, non-citric and without pulp, carbonated beverages, Gatorade, Kool -Aid, plain Jello-O and plain ice popsicles.  4. Labs: You will need to have blood drawn today (10/20).    5. Medication instructions in preparation for your procedure:   Stop taking, HTCZ (Hydrochlorothiazide ) Thursday, November 13,   On the morning of your procedure, take Aspirin  81 mg and Plavix /Clopidogrel  and any morning medicines NOT listed above.  You may use sips of water .  6. Plan to go home the same day, you will only stay overnight if medically necessary. 7. You MUST have a responsible adult to drive you home. 8. An adult MUST be with you the first 24 hours after you arrive home. 9. Bring a current list of your medications, and the last time and date medication taken. 10. Bring ID and current insurance cards. 11.Please wear clothes that are easy to get on and off and wear slip-on shoes.  Thank you for allowing us  to care for you!   -- [facility]

## 2024-02-02 NOTE — H&P (View-Only) (Signed)
 02/02/2024 Kerri Snyder   [birth year]  [id number]  Primary Physician Claudene Pellet, MD Primary Cardiologist: Dorn [name] [name], [name]  HPI:  Kerri Snyder is a 83 y.o.  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  She was referred by Glendia Ferrier, PA-C for peripheral vascular evaluation because of lifestyle-limiting claudication.  Her primary cardiologist is Dr. Oneil Parchment.  She is accompanied by her friend Olam Genre.  I last saw her in the office 12/23/2023.  She does have a history of CAD status post LAD and RCA intervention in the past (4, 5/21).  She has heart failure with reduced EF and moderate to severe MR as well as history of hypertension, and hyperlipidemia.  She is legally blind.  She lives with her grandson and gets out of the house rarely.  She does say however that she is limited by claudication left greater than right for several years.  She did have Doppler studies performed 01/28/2023 revealing a right ABI of 0.81, left of 0.74.  She had moderate disease in her right popliteal artery and an occluded right PTA.  She had high-grade left tibioperoneal trunk stenosis and an occluded left posterior tibial artery.   Since I saw her in the office 6 weeks ago she continues to complain of left calf claudication.  She failed empiric Pletal  therapy.  Her Dopplers studies performed 01/27/2024 revealed a right ABI of 0.98 with a high-frequency signal in the distal right SFA although she is asymptomatic on that side.  Her left ABI 0.52 with what appears to be a subtotally occluded popliteal artery.  She wishes to proceed with outpatient peripheral angiography and endovascular therapy for lifestyle-limiting claudication.   Current Meds  Medication Sig   ALPRAZolam (XANAX) 0.25 MG tablet Take 0.25 mg by mouth 2 (two) times daily. (Patient taking  differently: Take 0.25 mg by mouth as needed for anxiety or sleep.)   clopidogrel  (PLAVIX ) 75 MG tablet Take 1 tablet (75 mg total) by mouth daily with breakfast.   Evolocumab  (REPATHA  SURECLICK) 140 MG/ML SOAJ INJECT 140 MG INTO THE SKIN EVERY 14 (FOURTEEN) DAYS.   hydrochlorothiazide  (HYDRODIURIL ) 25 MG tablet Take 25 mg by mouth daily.   hydroxypropyl methylcellulose (ISOPTO TEARS) 2.5 % ophthalmic solution Place 1 drop into both eyes 4 (four) times daily as needed for dry eyes.   ketoconazole (NIZORAL) 2 % cream 1 application to affected area   LOSARTAN  POTASSIUM PO Take 25 mg by mouth daily at 6 (six) AM.   metoprolol  succinate (TOPROL -XL) 50 MG 24 hr tablet TAKE 1 TABLET BY MOUTH DAILY. TAKE WITH OR IMMEDIATELY FOLLOWING A MEAL.   nitroGLYCERIN  (NITROSTAT ) 0.4 MG SL tablet PLACE 1 TABLET UNDER THE TONGUE EVERY 5 MINUTES AS NEEDED FOR CHEST PAIN.   vitamin B-12 (CYANOCOBALAMIN) 1000 MCG tablet Take 1,000 mcg by mouth daily.     Allergies  Allergen Reactions   Pitavastatin     Other Reaction(s): severe leg cramps   Rosuvastatin      Other Reaction(s): leg pain and stomach upset    Social History   Socioeconomic History   Marital status: Married    Spouse name: Not on file   Number of children: Not on file   Years of education: Not on file   Highest education level: Not on file  Occupational History   Not  on file  Tobacco Use   Smoking status: Never   Smokeless tobacco: Never  Vaping Use   Vaping status: Never Used  Substance and Sexual Activity   Alcohol use: No    Alcohol/week: 0.0 standard drinks of alcohol   Drug use: No   Sexual activity: Never  Other Topics Concern   Not on file  Social History Narrative   Not on file   Social Drivers of Health   Financial Resource Strain: Not on file  Food Insecurity: Not on file  Transportation Needs: Not on file  Physical Activity: Not on file  Stress: Not on file  Social Connections: Not on file  Intimate Partner  Violence: Not on file     Review of Systems: General: negative for chills, fever, night sweats or weight changes.  Cardiovascular: negative for chest pain, dyspnea on exertion, edema, orthopnea, palpitations, paroxysmal nocturnal dyspnea or shortness of breath Dermatological: negative for rash Respiratory: negative for cough or wheezing Urologic: negative for hematuria Abdominal: negative for nausea, vomiting, diarrhea, bright red blood per rectum, melena, or hematemesis Neurologic: negative for visual changes, syncope, or dizziness All other systems reviewed and are otherwise negative except as noted above.    Blood pressure (!) 130/50, pulse 71, height 5' 1 (1.549 m), weight 144 lb (65.3 kg), SpO2 98%.  General appearance: alert and no distress Neck: no adenopathy, no carotid bruit, no JVD, supple, symmetrical, trachea midline, and thyroid not enlarged, symmetric, no tenderness/mass/nodules Lungs: clear to auscultation bilaterally Heart: regular rate and rhythm, S1, S2 normal, no murmur, click, rub or gallop Extremities: extremities normal, atraumatic, no cyanosis or edema Pulses: Decreased left pedal pulse Skin: Skin color, texture, turgor normal. No rashes or lesions Neurologic: Grossly normal  EKG not performed today      ASSESSMENT AND PLAN:   Peripheral vascular disease Patient has symptomatic PAD with lifestyle-limiting claudication in her left calf.  She failed empiric Pletal  therapy.  Her most recent Doppler studies performed 01/27/2024 revealed a right ABI of 0.98 and a left of 0.52.  She did have a mildly elevated signal in the mid to distal right SFA and what appears to be possibly a subtotally occluded popliteal artery.  She wishes to proceed with outpatient peripheral angiography and endovascular therapy.     Dorn DOROTHA Lesches MD FACP,FACC,FAHA, [facility] 02/02/2024 2:44 PM

## 2024-02-02 NOTE — Assessment & Plan Note (Signed)
 Patient has symptomatic PAD with lifestyle-limiting claudication in her left calf.  She failed empiric Pletal  therapy.  Her most recent Doppler studies performed 01/27/2024 revealed a right ABI of 0.98 and a left of 0.52.  She did have a mildly elevated signal in the mid to distal right SFA and what appears to be possibly a subtotally occluded popliteal artery.  She wishes to proceed with outpatient peripheral angiography and endovascular therapy.

## 2024-02-02 NOTE — Progress Notes (Signed)
 02/02/2024 Kerri Snyder   [birth year]  [id number]  Primary Physician Kerri Pellet, MD Primary Cardiologist: Kerri [name] Kerri Snyder, Kerri Snyder  HPI:  Kerri Snyder is a 83 y.o.  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  She was referred by Kerri Ferrier, PA-C for peripheral vascular evaluation because of lifestyle-limiting claudication.  Her primary cardiologist is Dr. Oneil Snyder.  She is accompanied by her friend Kerri Snyder.  I last saw her in the office 12/23/2023.  She does have a history of CAD status post LAD and RCA intervention in the past (4, 5/21).  She has heart failure with reduced EF and moderate to severe MR as well as history of hypertension, and hyperlipidemia.  She is legally blind.  She lives with her grandson and gets out of the house rarely.  She does say however that she is limited by claudication left greater than right for several years.  She did have Doppler studies performed 01/28/2023 revealing a right ABI of 0.81, left of 0.74.  She had moderate disease in her right popliteal artery and an occluded right PTA.  She had high-grade left tibioperoneal trunk stenosis and an occluded left posterior tibial artery.   Since I saw her in the office 6 weeks ago she continues to complain of left calf claudication.  She failed empiric Pletal  therapy.  Her Dopplers studies performed 01/27/2024 revealed a right ABI of 0.98 with a high-frequency signal in the distal right SFA although she is asymptomatic on that side.  Her left ABI 0.52 with what appears to be a subtotally occluded popliteal artery.  She wishes to proceed with outpatient peripheral angiography and endovascular therapy for lifestyle-limiting claudication.   Current Meds  Medication Sig   ALPRAZolam (XANAX) 0.25 MG tablet Take 0.25 mg by mouth 2 (two) times daily. (Patient taking  differently: Take 0.25 mg by mouth as needed for anxiety or sleep.)   clopidogrel  (PLAVIX ) 75 MG tablet Take 1 tablet (75 mg total) by mouth daily with breakfast.   Evolocumab  (REPATHA  SURECLICK) 140 MG/ML SOAJ INJECT 140 MG INTO THE SKIN EVERY 14 (FOURTEEN) DAYS.   hydrochlorothiazide  (HYDRODIURIL ) 25 MG tablet Take 25 mg by mouth daily.   hydroxypropyl methylcellulose (ISOPTO TEARS) 2.5 % ophthalmic solution Place 1 drop into both eyes 4 (four) times daily as needed for dry eyes.   ketoconazole (NIZORAL) 2 % cream 1 application to affected area   LOSARTAN  POTASSIUM PO Take 25 mg by mouth daily at 6 (six) AM.   metoprolol  succinate (TOPROL -XL) 50 MG 24 hr tablet TAKE 1 TABLET BY MOUTH DAILY. TAKE WITH OR IMMEDIATELY FOLLOWING A MEAL.   nitroGLYCERIN  (NITROSTAT ) 0.4 MG SL tablet PLACE 1 TABLET UNDER THE TONGUE EVERY 5 MINUTES AS NEEDED FOR CHEST PAIN.   vitamin B-12 (CYANOCOBALAMIN) 1000 MCG tablet Take 1,000 mcg by mouth daily.     Allergies  Allergen Reactions   Pitavastatin     Other Reaction(s): severe leg cramps   Rosuvastatin      Other Reaction(s): leg pain and stomach upset    Social History   Socioeconomic History   Marital status: Married    Spouse name: Not on file   Number of children: Not on file   Years of education: Not on file   Highest education level: Not on file  Occupational History   Not  on file  Tobacco Use   Smoking status: Never   Smokeless tobacco: Never  Vaping Use   Vaping status: Never Used  Substance and Sexual Activity   Alcohol use: No    Alcohol/week: 0.0 standard drinks of alcohol   Drug use: No   Sexual activity: Never  Other Topics Concern   Not on file  Social History Narrative   Not on file   Social Drivers of Health   Financial Resource Strain: Not on file  Food Insecurity: Not on file  Transportation Needs: Not on file  Physical Activity: Not on file  Stress: Not on file  Social Connections: Not on file  Intimate Partner  Violence: Not on file     Review of Systems: General: negative for chills, fever, night sweats or weight changes.  Cardiovascular: negative for chest pain, dyspnea on exertion, edema, orthopnea, palpitations, paroxysmal nocturnal dyspnea or shortness of breath Dermatological: negative for rash Respiratory: negative for cough or wheezing Urologic: negative for hematuria Abdominal: negative for nausea, vomiting, diarrhea, bright red blood per rectum, melena, or hematemesis Neurologic: negative for visual changes, syncope, or dizziness All other systems reviewed and are otherwise negative except as noted above.    Blood pressure (!) 130/50, pulse 71, height 5' 1 (1.549 m), weight 144 lb (65.3 kg), SpO2 98%.  General appearance: alert and no distress Neck: no adenopathy, no carotid bruit, no JVD, supple, symmetrical, trachea midline, and thyroid not enlarged, symmetric, no tenderness/mass/nodules Lungs: clear to auscultation bilaterally Heart: regular rate and rhythm, S1, S2 normal, no murmur, click, rub or gallop Extremities: extremities normal, atraumatic, no cyanosis or edema Pulses: Decreased left pedal pulse Skin: Skin color, texture, turgor normal. No rashes or lesions Neurologic: Grossly normal  EKG not performed today      ASSESSMENT AND PLAN:   Peripheral vascular disease Patient has symptomatic PAD with lifestyle-limiting claudication in her left calf.  She failed empiric Pletal  therapy.  Her most recent Doppler studies performed 01/27/2024 revealed a right ABI of 0.98 and a left of 0.52.  She did have a mildly elevated signal in the mid to distal right SFA and what appears to be possibly a subtotally occluded popliteal artery.  She wishes to proceed with outpatient peripheral angiography and endovascular therapy.     Kerri Kerri Lesches MD FACP,FACC,FAHA, [facility] 02/02/2024 2:44 PM

## 2024-02-03 ENCOUNTER — Ambulatory Visit: Admitting: Cardiovascular Disease

## 2024-02-03 ENCOUNTER — Ambulatory Visit: Payer: Self-pay | Admitting: Cardiovascular Disease

## 2024-02-03 LAB — CBC
Hematocrit: 38.1 % (ref 34.0–46.6)
Hemoglobin: 12.3 g/dL (ref 11.1–15.9)
MCH: 30.9 pg (ref 26.6–33.0)
MCHC: 32.3 g/dL (ref 31.5–35.7)
MCV: 96 fL (ref 79–97)
Platelets: 272 x10E3/uL (ref 150–450)
RBC: 3.98 x10E6/uL (ref 3.77–5.28)
RDW: 12.8 % (ref 11.7–15.4)
WBC: 8.2 x10E3/uL (ref 3.4–10.8)

## 2024-02-03 LAB — BASIC METABOLIC PANEL WITH GFR
BUN/Creatinine Ratio: 34 — AB (ref 12–28)
BUN: 36 mg/dL — AB (ref 8–27)
CO2: 23 mmol/L (ref 20–29)
Calcium: 10.7 mg/dL — AB (ref 8.7–10.3)
Chloride: 101 mmol/L (ref 96–106)
Creatinine, Ser: 1.05 mg/dL — AB (ref 0.57–1.00)
Glucose: 91 mg/dL (ref 70–99)
Potassium: 4.8 mmol/L (ref 3.5–5.2)
Sodium: 138 mmol/L (ref 134–144)
eGFR: 52 mL/min/1.73 — AB (ref >59)

## 2024-02-10 ENCOUNTER — Other Ambulatory Visit: Payer: Self-pay | Admitting: Cardiovascular Disease

## 2024-02-10 DIAGNOSIS — I739 Peripheral vascular disease, unspecified: Secondary | ICD-10-CM

## 2024-02-17 ENCOUNTER — Telehealth: Payer: Self-pay | Admitting: Cardiology

## 2024-02-17 NOTE — Telephone Encounter (Signed)
 Spoke with pt regarding her procedure on 11/13. Pt was actually wanting to make sure that Dr. Jeffrie was aware that she is having a lower extremity procedure and that he was ok with this moving forward. Advised pt that Dr. Ranee note from her office visit was sent to Dr. Jeffrie to help keep everyone on the same page. Pt has no further questions at this time.

## 2024-02-17 NOTE — Telephone Encounter (Signed)
 Left message for pt to call back. Advised pt in message that Dr. Court is the doctor that is doing her procedure on 11/13. Call back number left.

## 2024-02-17 NOTE — Telephone Encounter (Signed)
 Called patient left message on personal voice mail to call back.

## 2024-02-17 NOTE — Telephone Encounter (Signed)
 Pts grandson returning call. He ask you contact Sharol on the number listed at Home.   [phone]

## 2024-02-17 NOTE — Telephone Encounter (Signed)
 Patient wanted to let Dr. Court know she will be having a stent placed in her left leg on 11/13.

## 2024-02-24 ENCOUNTER — Telehealth: Payer: Self-pay | Admitting: *Deleted

## 2024-02-24 NOTE — Telephone Encounter (Signed)
 Left detailed message on voicemail with procedure instructions.

## 2024-02-24 NOTE — Telephone Encounter (Signed)
 Called patient and reviewed procedure instructions.

## 2024-02-24 NOTE — Telephone Encounter (Signed)
 LEA scheduled at [facility] for: Thursday February 26, 2024 7:30 AM Arrival time [facility] Main Entrance A at: 5:30 AM  Diet: -Nothing to eat after midnight.  Hydration: -May drink clear liquids until 2 hours before the procedure.  Approved liquids: Water , clear tea, black coffee, fruit juices-non-citric and without pulp,Gatorade, plain Jello/popsicles.   -Please drink 16 oz of water  2 hours before procedure.  Medication instructions: -Hold:  Hydrochlorothiazide /Losartan -day before and day of procedure -per protocol GFR <60 (52) -Other usual morning medications can be taken including aspirin  81 mg and Plavix  75 mg  Plan to go home the same day, you will only stay overnight if medically necessary.  You must have responsible adult to drive you home.  Someone must be with you the first 24 hours after you arrive home.  Left message for patient to call back to review procedure instructions

## 2024-02-24 NOTE — Telephone Encounter (Signed)
 Patient is returning your call. Please advise. ?

## 2024-02-26 ENCOUNTER — Ambulatory Visit (HOSPITAL_COMMUNITY)
Admission: RE | Admit: 2024-02-26 | Discharge: 2024-02-27 | Disposition: A | Discharge status: Home / Self Care | Attending: Cardiovascular Disease | Admitting: Cardiovascular Disease

## 2024-02-26 ENCOUNTER — Other Ambulatory Visit: Payer: Self-pay

## 2024-02-26 ENCOUNTER — Encounter (HOSPITAL_COMMUNITY)
Admission: RE | Disposition: A | Discharge status: Home / Self Care | Payer: Self-pay | Source: Home / Self Care | Attending: Cardiovascular Disease

## 2024-02-26 DIAGNOSIS — I110 Hypertensive heart disease with heart failure: Secondary | ICD-10-CM | POA: Insufficient documentation

## 2024-02-26 DIAGNOSIS — I2510 Atherosclerotic heart disease of native coronary artery without angina pectoris: Secondary | ICD-10-CM | POA: Diagnosis not present

## 2024-02-26 DIAGNOSIS — I70212 Atherosclerosis of native arteries of extremities with intermittent claudication, left leg: Secondary | ICD-10-CM

## 2024-02-26 DIAGNOSIS — I5022 Chronic systolic (congestive) heart failure: Secondary | ICD-10-CM | POA: Diagnosis not present

## 2024-02-26 DIAGNOSIS — I7092 Chronic total occlusion of artery of the extremities: Secondary | ICD-10-CM | POA: Diagnosis not present

## 2024-02-26 DIAGNOSIS — E785 Hyperlipidemia, unspecified: Secondary | ICD-10-CM | POA: Diagnosis not present

## 2024-02-26 DIAGNOSIS — I739 Peripheral vascular disease, unspecified: Secondary | ICD-10-CM | POA: Diagnosis present

## 2024-02-26 DIAGNOSIS — Z79899 Other long term (current) drug therapy: Secondary | ICD-10-CM | POA: Insufficient documentation

## 2024-02-26 DIAGNOSIS — H548 Legal blindness, as defined in USA: Secondary | ICD-10-CM | POA: Diagnosis not present

## 2024-02-26 DIAGNOSIS — Z7902 Long term (current) use of antithrombotics/antiplatelets: Secondary | ICD-10-CM | POA: Diagnosis not present

## 2024-02-26 LAB — POCT ACTIVATED CLOTTING TIME
Activated Clotting Time: 285 s
Activated Clotting Time: 250 s
Activated Clotting Time: 268 s
Activated Clotting Time: 222 s
Activated Clotting Time: 170 s

## 2024-02-26 MED ORDER — HEPARIN SODIUM (PORCINE) 1000 UNIT/ML IJ SOLN
INTRAMUSCULAR | Status: DC | PRN
  Administered 2024-02-26: 2500 [IU] via INTRAVENOUS
  Administered 2024-02-26: 7000 [IU] via INTRAVENOUS

## 2024-02-26 MED ORDER — FREE WATER: 500.0000 mL | Freq: Once | Status: DC

## 2024-02-26 MED ORDER — LIDOCAINE HCL (PF) 1 % IJ SOLN
INTRAMUSCULAR | Status: AC
  Filled 2024-02-26: qty 30

## 2024-02-26 MED ORDER — SODIUM CHLORIDE 0.9 % IV SOLN: 250.0000 mL | INTRAVENOUS | Status: AC | PRN

## 2024-02-26 MED ORDER — FENTANYL CITRATE (PF) 100 MCG/2ML IJ SOLN
INTRAMUSCULAR | Status: AC
  Filled 2024-02-26: qty 2

## 2024-02-26 MED ORDER — HYDRALAZINE HCL 20 MG/ML IJ SOLN
INTRAMUSCULAR | Status: DC | PRN
  Administered 2024-02-26: 5 mg via INTRAVENOUS

## 2024-02-26 MED ORDER — ONDANSETRON HCL 4 MG/2ML IJ SOLN: 4.0000 mg | Freq: Four times a day (QID) | INTRAMUSCULAR | Status: DC | PRN

## 2024-02-26 MED ORDER — CLOPIDOGREL BISULFATE 300 MG PO TABS
ORAL_TABLET | ORAL | Status: DC | PRN
  Administered 2024-02-26: 300 mg via ORAL

## 2024-02-26 MED ORDER — HYDROCHLOROTHIAZIDE 25 MG PO TABS
25.0000 mg | ORAL_TABLET | Freq: Every day | ORAL | Status: DC
Start: 2024-02-27 — End: 2024-02-27
  Administered 2024-02-27: 25 mg via ORAL
  Filled 2024-02-26: qty 1

## 2024-02-26 MED ORDER — HYDRALAZINE HCL 20 MG/ML IJ SOLN: 5.0000 mg | INTRAMUSCULAR | Status: DC | PRN

## 2024-02-26 MED ORDER — ACETAMINOPHEN 325 MG PO TABS: 650.0000 mg | ORAL_TABLET | ORAL | Status: DC | PRN

## 2024-02-26 MED ORDER — MIDAZOLAM HCL 2 MG/2ML IJ SOLN
INTRAMUSCULAR | Status: AC
  Filled 2024-02-26: qty 2

## 2024-02-26 MED ORDER — SODIUM CHLORIDE 0.9 % IV SOLN: 250.0000 mL | INTRAVENOUS | Status: DC | PRN

## 2024-02-26 MED ORDER — SODIUM CHLORIDE 0.9% FLUSH: 3.0000 mL | INTRAVENOUS | Status: DC | PRN

## 2024-02-26 MED ORDER — ALPRAZOLAM 0.25 MG PO TABS
0.2500 mg | ORAL_TABLET | Freq: Two times a day (BID) | ORAL | Status: DC
  Administered 2024-02-26 – 2024-02-27 (×3): 0.25 mg via ORAL
  Filled 2024-02-26 (×3): qty 1

## 2024-02-26 MED ORDER — LIDOCAINE HCL (PF) 1 % IJ SOLN
INTRAMUSCULAR | Status: DC | PRN
  Administered 2024-02-26: 15 mL

## 2024-02-26 MED ORDER — CLOPIDOGREL BISULFATE 300 MG PO TABS
ORAL_TABLET | ORAL | Status: AC
  Filled 2024-02-26: qty 1

## 2024-02-26 MED ORDER — FENTANYL CITRATE (PF) 100 MCG/2ML IJ SOLN
INTRAMUSCULAR | Status: DC | PRN
  Administered 2024-02-26: 25 ug via INTRAVENOUS

## 2024-02-26 MED ORDER — METOPROLOL SUCCINATE ER 50 MG PO TB24
50.0000 mg | ORAL_TABLET | Freq: Every day | ORAL | Status: DC
  Administered 2024-02-26 – 2024-02-27 (×2): 50 mg via ORAL
  Filled 2024-02-26 (×2): qty 1

## 2024-02-26 MED ORDER — IODIXANOL 320 MG/ML IV SOLN
INTRAVENOUS | Status: DC | PRN
  Administered 2024-02-26: 131 mL

## 2024-02-26 MED ORDER — SODIUM CHLORIDE 0.9 % IV SOLN: INTRAVENOUS | Status: AC

## 2024-02-26 MED ORDER — SODIUM CHLORIDE 0.9% FLUSH: 3.0000 mL | Freq: Two times a day (BID) | INTRAVENOUS | Status: DC

## 2024-02-26 MED ORDER — LABETALOL HCL 5 MG/ML IV SOLN: 10.0000 mg | INTRAVENOUS | Status: DC | PRN

## 2024-02-26 MED ORDER — ASPIRIN 81 MG PO TBEC
81.0000 mg | DELAYED_RELEASE_TABLET | Freq: Every day | ORAL | Status: DC
  Administered 2024-02-27: 81 mg via ORAL
  Filled 2024-02-26: qty 1

## 2024-02-26 MED ORDER — ASPIRIN 81 MG PO CHEW
CHEWABLE_TABLET | ORAL | Status: DC | PRN
  Administered 2024-02-26: 81 mg via ORAL

## 2024-02-26 MED ORDER — LOSARTAN POTASSIUM 25 MG PO TABS
25.0000 mg | ORAL_TABLET | Freq: Every day | ORAL | Status: DC
  Administered 2024-02-27: 25 mg via ORAL
  Filled 2024-02-26: qty 1

## 2024-02-26 MED ORDER — SODIUM CHLORIDE 0.9% FLUSH
3.0000 mL | INTRAVENOUS | Status: DC | PRN
Start: 2024-02-26 — End: 2024-02-27

## 2024-02-26 MED ORDER — SODIUM CHLORIDE 0.9% FLUSH
3.0000 mL | Freq: Two times a day (BID) | INTRAVENOUS | Status: DC
  Administered 2024-02-26 – 2024-02-27 (×3): 3 mL via INTRAVENOUS

## 2024-02-26 MED ORDER — HEPARIN SODIUM (PORCINE) 1000 UNIT/ML IJ SOLN
INTRAMUSCULAR | Status: AC
  Filled 2024-02-26: qty 10

## 2024-02-26 MED ORDER — MORPHINE SULFATE (PF) 2 MG/ML IV SOLN
2.0000 mg | INTRAVENOUS | Status: DC | PRN
  Administered 2024-02-26 (×2): 2 mg via INTRAVENOUS
  Filled 2024-02-26 (×2): qty 1

## 2024-02-26 MED ORDER — ASPIRIN 81 MG PO CHEW
CHEWABLE_TABLET | ORAL | Status: AC
  Filled 2024-02-26: qty 1

## 2024-02-26 MED ORDER — CLOPIDOGREL BISULFATE 75 MG PO TABS: 75.0000 mg | ORAL_TABLET | Freq: Every day | ORAL | Status: DC

## 2024-02-26 MED ORDER — HYDRALAZINE HCL 20 MG/ML IJ SOLN
INTRAMUSCULAR | Status: AC
  Filled 2024-02-26: qty 1

## 2024-02-26 MED ORDER — MIDAZOLAM HCL (PF) 2 MG/2ML IJ SOLN
INTRAMUSCULAR | Status: DC | PRN
  Administered 2024-02-26: 1 mg via INTRAVENOUS

## 2024-02-26 MED ORDER — CLOPIDOGREL BISULFATE 75 MG PO TABS
75.0000 mg | ORAL_TABLET | Freq: Every day | ORAL | Status: DC
  Administered 2024-02-27: 75 mg via ORAL
  Filled 2024-02-26: qty 1

## 2024-02-26 NOTE — Interval H&P Note (Signed)
 History and Physical Interval Note:  02/26/2024 7:37 AM  Kerri Snyder  has presented today for surgery, with the diagnosis of pad.  The various methods of treatment have been discussed with the patient and family. After consideration of risks, benefits and other options for treatment, the patient has consented to  Procedure(s): Lower Extremity Angiography (N/A) as a surgical intervention.  The patient's history has been reviewed, patient examined, no change in status, stable for surgery.  I have reviewed the patient's chart and labs.  Questions were answered to the patient's satisfaction.     Dorn Lesches

## 2024-02-26 NOTE — Progress Notes (Signed)
 Site area: Right Femoral Site Prior to Removal:  Level 0 Pressure Applied For: 30 min Manual:  Yes  Patient Status During Pull:  Stable Post Pull Site:  Level 0 Post Pull Instructions Given: Flat time 6 hours Post Pull Pulses Present: Yes Dressing Applied:  Gauze and tegaderm Bedrest begins @ 1312 Comments:

## 2024-02-27 ENCOUNTER — Encounter (HOSPITAL_COMMUNITY): Payer: Self-pay | Admitting: Cardiovascular Disease

## 2024-02-27 DIAGNOSIS — I739 Peripheral vascular disease, unspecified: Secondary | ICD-10-CM | POA: Diagnosis not present

## 2024-02-27 DIAGNOSIS — I70212 Atherosclerosis of native arteries of extremities with intermittent claudication, left leg: Secondary | ICD-10-CM | POA: Diagnosis not present

## 2024-02-27 LAB — BASIC METABOLIC PANEL WITH GFR
Anion gap: 11 (ref 5–15)
BUN: 16 mg/dL (ref 8–23)
CO2: 21 mmol/L — ABNORMAL LOW (ref 22–32)
Calcium: 9.2 mg/dL (ref 8.9–10.3)
Chloride: 106 mmol/L (ref 98–111)
Creatinine, Ser: 0.76 mg/dL (ref 0.44–1.00)
GFR, Estimated: >60 mL/min (ref >60)
Glucose, Bld: 102 mg/dL — ABNORMAL HIGH (ref 70–99)
Potassium: 3.7 mmol/L (ref 3.5–5.1)
Sodium: 138 mmol/L (ref 135–145)

## 2024-02-27 LAB — CBC
HCT: 35.7 % — ABNORMAL LOW (ref 36.0–46.0)
Hemoglobin: 12 g/dL (ref 12.0–15.0)
MCH: 30.6 pg (ref 26.0–34.0)
MCHC: 33.6 g/dL (ref 30.0–36.0)
MCV: 91.1 fL (ref 80.0–100.0)
Platelets: 214 K/uL (ref 150–400)
RBC: 3.92 MIL/uL (ref 3.87–5.11)
RDW: 12.6 % (ref 11.5–15.5)
WBC: 7.2 K/uL (ref 4.0–10.5)
nRBC: 0 % (ref 0.0–0.2)

## 2024-02-27 LAB — LIPID PANEL
Cholesterol: 153 mg/dL (ref 0–200)
HDL: 52 mg/dL (ref >40)
LDL Cholesterol: 77 mg/dL (ref 0–99)
Total CHOL/HDL Ratio: 2.9 ratio
Triglycerides: 121 mg/dL (ref <150)
VLDL: 24 mg/dL (ref 0–40)

## 2024-02-27 MED ORDER — ASPIRIN 81 MG PO TBEC: 81.0000 mg | DELAYED_RELEASE_TABLET | Freq: Every day | ORAL | Status: AC

## 2024-02-27 NOTE — Progress Notes (Signed)
 Mobility Specialist Progress Note:    02/27/24 0923  Mobility  Activity Ambulated with assistance  Level of Assistance Modified independent, requires aide device or extra time  Assistive Device Other (Comment) (HHA)  Distance Ambulated (ft) 250 ft  Activity Response Tolerated well  Mobility Referral Yes  Mobility visit 1 Mobility  Mobility Specialist Start Time (ACUTE ONLY) [id number]  Mobility Specialist Stop Time (ACUTE ONLY) 0929  Mobility Specialist Time Calculation (min) (ACUTE ONLY) 6 min   Pt received in bed, agreeable to mobility session ModI via HHA for safety. Tolerated well, asx throughout. Returned to room, all needs met, eager for d/c.   Madyx Delfin Mobility Specialist Please contact via SecureChat or  Rehab office at [phone]

## 2024-02-27 NOTE — TOC CM/SW Note (Signed)
 Transition of Care [facility]) - Inpatient Brief Assessment   Patient Details  Name: Kerri Snyder MRN: [id number] Date of Birth: [birth year]  Transition of Care [facility]) CM/SW Contact:    Waddell Barnie Rama, RN Phone Number: 02/27/2024, 11:04 AM   Clinical Narrative: From home with grandson,  has PCP and insurance on file, states has no HH services in place at this time , has a cane at home.  States family member ( grandson) will transport them home at [employer] and family is support system, states gets medications from [facility] on [address]  Pta self ambulatory with cane.   There are no ICM  needs identified  at this time.  Please place consult for ICM needs.    Transition of Care Asessment: Insurance and Status: Insurance coverage has been reviewed Patient has primary care physician: Yes Home environment has been reviewed: home with grandson Prior level of function:: ambulatory with cane Prior/Current Home Services: Current home services (cane) Social Drivers of Health Review: SDOH reviewed no interventions necessary Readmission risk has been reviewed: Yes Transition of care needs: no transition of care needs at this time

## 2024-02-27 NOTE — TOC Transition Note (Signed)
 Transition of Care [facility]) - Discharge Note   Patient Details  Name: Kerri Snyder MRN: [id number] Date of Birth: Nov 12, 1940  Transition of Care [facility]) CM/SW Contact:  Waddell Barnie Rama, RN Phone Number: 02/27/2024, 11:05 AM   Clinical Narrative:    For possible dc today, grandson will transport home. She has no needs.         Patient Goals and CMS Choice            Discharge Placement                       Discharge Plan and Services Additional resources added to the After Visit Summary for                                       Social Drivers of Health (SDOH) Interventions SDOH Screenings   Food Insecurity: No Food Insecurity (02/26/2024)  Housing: Unknown (02/26/2024)  Transportation Needs: No Transportation Needs (02/26/2024)  Utilities: Not At Risk (02/26/2024)  Social Connections: Unknown (02/26/2024)  Tobacco Use: Low Risk  (02/02/2024)     Readmission Risk Interventions     No data to display

## 2024-02-27 NOTE — Progress Notes (Signed)
 Rounding Note   Patient Name: Kerri Snyder Date of Encounter: 02/27/2024  [facility] Cardiologist: Oneil Parchment, MD  PV: Court  Subjective  No complaints  Scheduled Meds:  ALPRAZolam  0.25 mg Oral BID   aspirin  EC  81 mg Oral Daily   clopidogrel   75 mg Oral Q breakfast   hydrochlorothiazide   25 mg Oral Daily   losartan   25 mg Oral Q0600   metoprolol  succinate  50 mg Oral Daily   sodium chloride  flush  3 mL Intravenous Q12H   Continuous Infusions:  sodium chloride      PRN Meds: sodium chloride , acetaminophen , hydrALAZINE , labetalol , morphine  injection, ondansetron  (ZOFRAN ) IV, sodium chloride  flush   Vital Signs  Vitals:   02/26/24 2004 02/27/24 0050 02/27/24 0240 02/27/24 0817  BP: (!) 119/52 128/67 (!) 134/59 107/73  Pulse: 82 84 80 77  Resp: 18 16 16 15   Temp: 97.8 F (36.6 C) 97.8 F (36.6 C) 97.9 F (36.6 C) 98.4 F (36.9 C)  TempSrc: Oral Oral Oral Oral  SpO2: 100% 97% 97% 98%  Weight:      Height:        Intake/Output Summary (Last 24 hours) at 02/27/2024 1033 Last data filed at 02/27/2024 0837 Gross per 24 hour  Intake 1482.5 ml  Output 500 ml  Net 982.5 ml      02/26/2024    6:23 AM 02/02/2024    2:11 PM 01/03/2024   10:52 AM  Last 3 Weights  Weight (lbs) 140 lb 144 lb 142 lb 6.7 oz  Weight (kg) 63.504 kg 65.318 kg 64.6 kg      Telemetry Sinus - Personally Reviewed  ECG  No am tracing - Personally Reviewed  Physical Exam  GEN: No acute distress.   Neck: No JVD Cardiac: RRR, no murmurs, rubs, or gallops.  Respiratory: Clear to auscultation bilaterally. GI: Soft, nontender, non-distended  MS: No edema; No deformity. Neuro:  Nonfocal  Psych: Normal affect   Labs High Sensitivity Troponin:  No results for input(s): TROPONINIHS in the last 720 hours.   Chemistry Recent Labs  Lab 02/27/24 0238  NA 138  K 3.7  CL 106  CO2 21*  GLUCOSE 102*  BUN 16  CREATININE 0.76  CALCIUM  9.2  GFRNONAA >60  ANIONGAP 11     Lipids  Recent Labs  Lab 02/27/24 0238  CHOL 153  TRIG 121  HDL 52  LDLCALC 77  CHOLHDL 2.9    Hematology Recent Labs  Lab 02/27/24 0238  WBC 7.2  RBC 3.92  HGB 12.0  HCT 35.7*  MCV 91.1  MCH 30.6  MCHC 33.6  RDW 12.6  PLT 214   Thyroid No results for input(s): TSH, FREET4 in the last 168 hours.  BNPNo results for input(s): BNP, PROBNP in the last 168 hours.  DDimer No results for input(s): DDIMER in the last 168 hours.   Radiology  PERIPHERAL VASCULAR CATHETERIZATION Result Date: 02/26/2024 Images from the original result were not included.  [id number] LOCATION:  FACILITY: [facility] PHYSICIAN: Dorn Court, M.D. 03-30-1941 DATE OF PROCEDURE:  02/26/2024 DATE OF DISCHARGE: PV Angiogram/Intervention History obtained from chart review. Kerri Snyder is a 83 y.o.  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  She was referred by Glendia Ferrier, PA-C for peripheral vascular evaluation because of lifestyle-limiting claudication.  Her primary cardiologist is Dr. Oneil Parchment.  She is accompanied by her friend Olam Genre.  I last saw her in the office 12/23/2023.  She does have a history of CAD status post LAD and RCA intervention in the past (4, 5/21).  She has heart failure with reduced EF and moderate to severe MR as well as history of hypertension, and hyperlipidemia.  She is legally blind.  She lives with her grandson and gets out of the house rarely.  She does say however that she is limited by claudication left greater than right for several years.  She did have Doppler studies performed 01/28/2023 revealing a right ABI of 0.81, left of 0.74.  She had moderate disease in her right popliteal artery and an occluded right PTA.  She had high-grade left tibioperoneal trunk stenosis and an occluded left posterior tibial artery.  Since I saw her in the office 6 weeks  ago she continues to complain of left calf claudication.  She failed empiric Pletal  therapy.  Her Dopplers studies performed 01/27/2024 revealed a right ABI of 0.98 with a high-frequency signal in the distal right SFA although she is asymptomatic on that side.  Her left ABI 0.52 with what appears to be a subtotally occluded popliteal artery.  She wishes to proceed with outpatient peripheral angiography and endovascular therapy for lifestyle-limiting claudication. Pre Procedure Diagnosis: Peripheral arterial disease Post Procedure Diagnosis: Peripheral arterial disease Operators: Dr. Dorn Lesches Procedures Performed:  1.  Ultrasound-guided right common femoral access  2.  Abdominal aortogram/bilateral iliac angiogram/bifemoral runoff  3.  Contralateral access (secondary catheter placement)  4.  Cross CTO of Viance catheter, PTA left popliteal artery CTO, shortness of balloon angioplasty, IV L, DCB PROCEDURE DESCRIPTION: The patient was brought to the second floor [facility] cath lab in the the postabsorptive state. She was premedicated with IV Versed  and fentanyl . Her right groin was prepped and shaved in usual sterile fashion. Xylocaine  1% was used for local anesthesia.  Ultrasound was used to identify the right common femoral artery and guide access.  Digital images captured and placed patient's chart.  A 5 French sheath was inserted into the right common femoral artery artery using standard Seldinger technique.  A 5 French pigtail catheter was placed in the distal abdominal aorta.  Distal abdominal aortography, bilateral iliac angiography with bifemoral runoff was performed using static images.  On the [facility] dialysis for the entirety of the case (total contrast 130 cc the patient).  Retrograde pressures monitored in the case. Angiographic Data: 1: Abdominal aorta-fluoroscopically calcified, mild diffuse atherosclerosis but no aneurysm or obstructive disease. 2: Left lower extremity-scattered moderate  disease in the left SFA.  Left popliteal artery was occluded in the P2 segment reconstituting the P3 segment.  The posterior tibial artery was occluded.  The peroneal artery was diffusely diseased.  The anterior tibial artery was a dominant vessel to the foot.   This Garret has lifestyle-limiting claudication on the left.  She has an occluded left popliteal artery with essentially one-vessel runoff.  Will proceed with attempted percutaneous revascularization. Procedure Description: Contralateral access was obtained with a crossover catheter, endhole catheter, 035 Glidewire.  The 5 French sheath was exchanged for a 6 French 45 cm catapult sheath which was then advanced over the iliac bifurcation.  Patient received a total of 95 units of heparin  with an ACT of 285 peak and 250 at the end of the case.  I was able to cross the CTO with a Viance CTO catheter along with a 1 4 Hi-Torque command  wire.  I then ballooned the CTO with a 2.5 mm x 60 mm long coyote balloon.  Following this I performed chocolate balloon angioplasty using a 3 mm x 80 mm long chocolate balloon.  Normal pressures.  There was a focal lesion probably representing distal That would not yield.  I elected to use IV L using a 4 mm x 80 mm long by VL delivering 200 pulses at 4 atm total.  The lesion ultimately yielded at 4 atm.  There was a small linear dissection that was not flow-limiting.  I ended the procedure by using a 4 mm x 60 mm long Ranger DCB at 4 atm for 3 minutes resulting in reduction of a P2 segment CTO in the left to 30% residual with a small nonflow limiting linear dissection.  The anterior tibial popped up and there was some minimal flow down the peroneal.  I then withdrew the wire and the catapult sheath across the bifurcation and exchanged for a short 6 French sheath over an 035 purser wire which was then secured in place.  The patient received an additional 300 mg of p.o. clopidogrel . Final Impression: Successful left P2 segment CTO  with one-vessel runoff for lifestyle-limiting claudication PTA using chocolate balloon angioplasty, shockwave angioplasty and DCB with a 30% residual and small nonflow limiting dissection.  Patient tolerated procedure well.  Sheath was removed once ACT falls below 170 pressure will be held.  To be hydrated overnight discharged in the morning on DAPT.  Will obtain left lower extremity with Doppler studies in our [place] office next week and I will see her back 1 to 2 weeks after that.  She left the lab in stable condition. Dorn Lesches. MD, [facility] 02/26/2024 9:34 AM     Patient Profile   83 y.o. female with PAD and lifestyle limiting claudication of her left leg. Dr. Lesches performed lower ext angiography and found occlusion of the left popliteal artery with one vessel runoff to the foot via the left anterior tibial artery. PV intervention performed with angioplasty, intravascular lithotripsy and drug coated balloon therapy.   Assessment & Plan   PAD: She is admitted post PV intervention. Doing well. See details above. Will plan to discharge home today on ASA and Plavix . Follow up with Dr. Lesches in 1-2 weeks with LE dopplers next week.   For questions or updates, please contact [facility] Please consult [email or web address] for contact info under     Signed, Lonni Cash, MD  02/27/2024, 10:33 AM

## 2024-02-27 NOTE — Discharge Summary (Addendum)
 Discharge Summary   Patient ID: Kerri Snyder MRN: [id number]; DOB: 29-Apr-1940  Admit date: 02/26/2024 Discharge date: 02/27/2024  PCP:  Claudene Pellet, MD   [facility] Providers Cardiologist:  Oneil Parchment, MD  PV Cardiologist:  Dorn Lesches, MD      Discharge Diagnoses  Principal Problem:   Claudication in peripheral vascular disease   Diagnostic Studies/Procedures   Procedures Performed:             1.  Ultrasound-guided right common femoral access             2.  Abdominal aortogram/bilateral iliac angiogram/bifemoral runoff             3.  Contralateral access (secondary catheter placement)             4.  Cross CTO of Viance catheter, PTA left popliteal artery CTO, shortness of balloon angioplasty, IV L, DCB   PROCEDURE DESCRIPTION:    The patient was brought to the second floor [facility] cath lab in the the postabsorptive state. She was premedicated with IV Versed  and fentanyl . Her right groin was prepped and shaved in usual sterile fashion. Xylocaine  1% was used for local anesthesia.  Ultrasound was used to identify the right common femoral artery and guide access.  Digital images captured and placed patient's chart.  A 5 French sheath was inserted into the right common femoral artery artery using standard Seldinger technique.  A 5 French pigtail catheter was placed in the distal abdominal aorta.  Distal abdominal aortography, bilateral iliac angiography with bifemoral runoff was performed using static images.  On the [facility] dialysis for the entirety of the case (total contrast 130 cc the patient).  Retrograde pressures monitored in the case.    Angiographic Data:    1: Abdominal aorta-fluoroscopically calcified, mild diffuse atherosclerosis but no aneurysm or obstructive disease. 2: Left lower extremity-scattered moderate disease in the left SFA.  Left popliteal artery was occluded in the P2 segment reconstituting the P3 segment.  The posterior tibial  artery was occluded.  The peroneal artery was diffusely diseased.  The anterior tibial artery was a dominant vessel to the foot.   IMPRESSION: This Garret has lifestyle-limiting claudication on the left.  She has an occluded left popliteal artery with essentially one-vessel runoff.  Will proceed with attempted percutaneous revascularization.   Procedure Description: Contralateral access was obtained with a crossover catheter, endhole catheter, 035 Glidewire.  The 5 French sheath was exchanged for a 6 French 45 cm catapult sheath which was then advanced over the iliac bifurcation.  Patient received a total of 95 units of heparin  with an ACT of 285 peak and 250 at the end of the case.  I was able to cross the CTO with a Viance CTO catheter along with a 1 4 Hi-Torque command wire.  I then ballooned the CTO with a 2.5 mm x 60 mm long coyote balloon.  Following this I performed chocolate balloon angioplasty using a 3 mm x 80 mm long chocolate balloon.  Normal pressures.  There was a focal lesion probably representing distal That would not yield.  I elected to use IV L using a 4 mm x 80 mm long by VL delivering 200 pulses at 4 atm total.  The lesion ultimately yielded at 4 atm.  There was a small linear dissection that was not flow-limiting.  I ended the procedure by using a 4 mm x 60 mm long Ranger DCB at 4 atm for 3  minutes resulting in reduction of a P2 segment CTO in the left to 30% residual with a small nonflow limiting linear dissection.  The anterior tibial popped up and there was some minimal flow down the peroneal.  I then withdrew the wire and the catapult sheath across the bifurcation and exchanged for a short 6 French sheath over an 035 purser wire which was then secured in place.  The patient received an additional 300 mg of p.o. clopidogrel .   Final Impression: Successful left P2 segment CTO with one-vessel runoff for lifestyle-limiting claudication PTA using chocolate balloon angioplasty, shockwave  angioplasty and DCB with a 30% residual and small nonflow limiting dissection.  Patient tolerated procedure well.  Sheath was removed once ACT falls below 170 pressure will be held.  To be hydrated overnight discharged in the morning on DAPT.  Will obtain left lower extremity with Doppler studies in our [place] office next week and I will see her back 1 to 2 weeks after that.  She left the lab in stable condition.   Dorn Lesches. MD, [facility] 02/26/2024 9:34 AM   _____________   History of Present Illness   Kerri Snyder is a 83 y.o. female with chronic HFrEF, CAD, PVD, mitral regurgitation, HTN, legally blind who presented to the hospital for planned PV angiogram. She has followed with Dr. Lesches for PAD. She failed outpatient Pletal  therapy. Her Dopplers studies performed 01/27/2024 revealed a right ABI of 0.98 with a high-frequency signal in the distal right SFA although she symptomatic on that side. Her left ABI was 0.52 with what appears to be a subtotally occluded popliteal artery.  She wished to proceed with outpatient peripheral angiography and endovascular therapy for lifestyle-limiting claudication.   Hospital Course    1. PAD - underwent PV Angio/intervention as above with successful left P2 segment CTO with one-vessel runoff for lifestyle-limiting claudication PTA using chocolate balloon angioplasty, shockwave angioplasty and DCB with a 30% residual and small nonflow limiting dissection  - tolerated procedure well, to be discharged on DAPT (was on Plavix  PTA, started on 81mg  ASA advised to get OTC per d/w pharmacist since no other meds coming from [facility] today) - OP f/u LE arterial studies and PV appointment arranged by PV team  2. HTN with hx of chronic HFrEF - BPs variable this admission, 100-150s -> trends this AM on softer side but feeling well, 107/73->99/59->99/52->112/56. Ambulated with no concerns - per D/w Dr. Verlin, hold hydrochlorothiazide  at discharge - pt given  instructions for monitoring BP and for fluid retention after discharge and to call for any concerns.  - re-evaluate in follow-up  Dr. Verlin has seen and examined the patient today and feels she is stable for discharge. He feels she may return to work in 1 week, advised standard post intervention instructions including no lifting over 5lb for 1 week.  _____________  Discharge Vitals Blood pressure (!) 112/56, pulse 76, temperature 98.1 F (36.7 C), temperature source Oral, resp. rate 15, height 5' 1 (1.549 m), weight 63.5 kg, SpO2 100%.  Filed Weights   02/26/24 0623  Weight: 63.5 kg    Labs & Radiologic Studies  CBC Recent Labs    02/27/24 0238  WBC 7.2  HGB 12.0  HCT 35.7*  MCV 91.1  PLT 214   Basic Metabolic Panel Recent Labs    88/85/74 0238  NA 138  K 3.7  CL 106  CO2 21*  GLUCOSE 102*  BUN 16  CREATININE 0.76  CALCIUM  9.2  Fasting Lipid Panel Recent Labs    02/27/24 0238  CHOL 153  HDL 52  LDLCALC 77  TRIG 121  CHOLHDL 2.9   No results found for: LIPOA  Thyroid Function Tests No results for input(s): TSH, T4TOTAL, T3FREE, THYROIDAB in the last 72 hours.  Invalid input(s): FREET3 _____________  PERIPHERAL VASCULAR CATHETERIZATION Result Date: 02/26/2024 Images from the original result were not included.  [id number] LOCATION:  FACILITY: [facility] PHYSICIAN: Dorn Lesches, M.D. 09-01-1940 DATE OF PROCEDURE:  02/26/2024 DATE OF DISCHARGE: PV Angiogram/Intervention History obtained from chart review. ETOSHA WETHERELL is a 82 y.o.  thin-appearing widowed Caucasian female mother of 1 son, grandmother of 3 grandchildren who continues to work at age 32 at the [employer] .  She was referred by Glendia Ferrier, PA-C for peripheral vascular evaluation because of lifestyle-limiting claudication.  Her primary cardiologist is Dr. Oneil Parchment.  She is accompanied by her friend Olam Genre.  I last saw her in the office 12/23/2023.  She  does have a history of CAD status post LAD and RCA intervention in the past (4, 5/21).  She has heart failure with reduced EF and moderate to severe MR as well as history of hypertension, and hyperlipidemia.  She is legally blind.  She lives with her grandson and gets out of the house rarely.  She does say however that she is limited by claudication left greater than right for several years.  She did have Doppler studies performed 01/28/2023 revealing a right ABI of 0.81, left of 0.74.  She had moderate disease in her right popliteal artery and an occluded right PTA.  She had high-grade left tibioperoneal trunk stenosis and an occluded left posterior tibial artery.  Since I saw her in the office 6 weeks ago she continues to complain of left calf claudication.  She failed empiric Pletal  therapy.  Her Dopplers studies performed 01/27/2024 revealed a right ABI of 0.98 with a high-frequency signal in the distal right SFA although she is asymptomatic on that side.  Her left ABI 0.52 with what appears to be a subtotally occluded popliteal artery.  She wishes to proceed with outpatient peripheral angiography and endovascular therapy for lifestyle-limiting claudication. Pre Procedure Diagnosis: Peripheral arterial disease Post Procedure Diagnosis: Peripheral arterial disease Operators: Dr. Dorn Lesches Procedures Performed:  1.  Ultrasound-guided right common femoral access  2.  Abdominal aortogram/bilateral iliac angiogram/bifemoral runoff  3.  Contralateral access (secondary catheter placement)  4.  Cross CTO of Viance catheter, PTA left popliteal artery CTO, shortness of balloon angioplasty, IV L, DCB PROCEDURE DESCRIPTION: The patient was brought to the second floor [place] cath lab in the the postabsorptive state. She was premedicated with IV Versed  and fentanyl . Her right groin was prepped and shaved in usual sterile fashion. Xylocaine  1% was used for local anesthesia.  Ultrasound was used to identify the  right common femoral artery and guide access.  Digital images captured and placed patient's chart.  A 5 French sheath was inserted into the right common femoral artery artery using standard Seldinger technique.  A 5 French pigtail catheter was placed in the distal abdominal aorta.  Distal abdominal aortography, bilateral iliac angiography with bifemoral runoff was performed using static images.  On the [facility] dialysis for the entirety of the case (total contrast 130 cc the patient).  Retrograde pressures monitored in the case. Angiographic Data: 1: Abdominal aorta-fluoroscopically calcified, mild diffuse atherosclerosis but no aneurysm or obstructive disease. 2: Left lower extremity-scattered  moderate disease in the left SFA.  Left popliteal artery was occluded in the P2 segment reconstituting the P3 segment.  The posterior tibial artery was occluded.  The peroneal artery was diffusely diseased.  The anterior tibial artery was a dominant vessel to the foot.   This Garret has lifestyle-limiting claudication on the left.  She has an occluded left popliteal artery with essentially one-vessel runoff.  Will proceed with attempted percutaneous revascularization. Procedure Description: Contralateral access was obtained with a crossover catheter, endhole catheter, 035 Glidewire.  The 5 French sheath was exchanged for a 6 French 45 cm catapult sheath which was then advanced over the iliac bifurcation.  Patient received a total of 95 units of heparin  with an ACT of 285 peak and 250 at the end of the case.  I was able to cross the CTO with a Viance CTO catheter along with a 1 4 Hi-Torque command wire.  I then ballooned the CTO with a 2.5 mm x 60 mm long coyote balloon.  Following this I performed chocolate balloon angioplasty using a 3 mm x 80 mm long chocolate balloon.  Normal pressures.  There was a focal lesion probably representing distal That would not yield.  I elected to use IV L using a 4 mm x 80 mm long by VL  delivering 200 pulses at 4 atm total.  The lesion ultimately yielded at 4 atm.  There was a small linear dissection that was not flow-limiting.  I ended the procedure by using a 4 mm x 60 mm long Ranger DCB at 4 atm for 3 minutes resulting in reduction of a P2 segment CTO in the left to 30% residual with a small nonflow limiting linear dissection.  The anterior tibial popped up and there was some minimal flow down the peroneal.  I then withdrew the wire and the catapult sheath across the bifurcation and exchanged for a short 6 French sheath over an 035 purser wire which was then secured in place.  The patient received an additional 300 mg of p.o. clopidogrel . Final Impression: Successful left P2 segment CTO with one-vessel runoff for lifestyle-limiting claudication PTA using chocolate balloon angioplasty, shockwave angioplasty and DCB with a 30% residual and small nonflow limiting dissection.  Patient tolerated procedure well.  Sheath was removed once ACT falls below 170 pressure will be held.  To be hydrated overnight discharged in the morning on DAPT.  Will obtain left lower extremity with Doppler studies in our [place] office next week and I will see her back 1 to 2 weeks after that.  She left the lab in stable condition. Dorn Lesches. MD, [facility] 02/26/2024 9:34 AM     Disposition Pt is being discharged home today in good condition.  Follow-up Plans & Appointments  Follow-up Information     Claudene Pellet, MD. Schedule an appointment as soon as possible for a visit on 03/03/2024.   Specialty: Family Medicine Why: 11:30 for hospital follow up Contact information: [address] [place] [address] [phone]         [facility] Dept of [employer]. [facility] Follow up.   Specialty: Cardiology Why: Keep follow-up as scheduled below for lower extremity arterial ultrasound testing and appointment with Dr. Lesches. Contact information: [address] [place]  [address] [phone]               Discharge Instructions     Diet - low sodium heart healthy  Complete by: As directed    Discharge instructions   Complete by: As directed    Please start a baby aspirin  81mg  daily (over the counter) to take in addition to your clopidogrel /Plavix . If you notice any bleeding such as blood in stool, black tarry stools, blood in urine, nosebleeds or any other unusual bleeding, call your doctor immediately. It is not normal to have this kind of bleeding while on a blood thinner and usually indicates there is an underlying problem with one of your body systems that needs to be checked out.    We stopped your hydrochlorothiazide  because your blood pressure was a little on the lower side. Please monitor this at home at least 3 hours after taking your morning medicines. If you see any low readings (I.e. less than 100) or high readings (I.e. greater than 140) or develop any fluid retention issues, please call your cardiologist's office.   Increase activity slowly   Complete by: As directed    No driving for 2 days. No lifting over 5 lbs for 1 week. No sexual activity for 1 week. You may return to work in 1 week if you are feeling well. Keep procedure site clean & dry. If you notice increased pain, swelling, bleeding or pus, call/return!  You may shower, but no soaking baths/hot tubs/pools for 1 week.       Discharge Medications Allergies as of 02/27/2024       Reactions   Pitavastatin    Other Reaction(s): severe leg cramps   Rosuvastatin     Other Reaction(s): leg pain and stomach upset        Medication List     STOP taking these medications    hydrochlorothiazide  25 MG tablet Commonly known as: HYDRODIURIL        TAKE these medications    acetaminophen  325 MG tablet Commonly known as: TYLENOL  Take 650 mg by mouth every 6 (six) hours as needed for moderate pain (pain score 4-6).   ALPRAZolam 0.25 MG  tablet Commonly known as: XANAX Take 0.25 mg by mouth 2 (two) times daily. What changed: when to take this   aspirin  EC 81 MG tablet Take 1 tablet (81 mg total) by mouth daily. Swallow whole. Start taking on: February 28, 2024   calcium  carbonate 500 MG chewable tablet Commonly known as: TUMS - dosed in mg elemental calcium  Chew 1 tablet by mouth daily as needed for indigestion or heartburn.   clopidogrel  75 MG tablet Commonly known as: PLAVIX  Take 1 tablet (75 mg total) by mouth daily with breakfast.   cyanocobalamin 1000 MCG tablet Commonly known as: VITAMIN B12 Take 1,000 mcg by mouth daily.   hydroxypropyl methylcellulose / hypromellose 2.5 % ophthalmic solution Commonly known as: ISOPTO TEARS / GONIOVISC Place 1 drop into both eyes 4 (four) times daily as needed for dry eyes.   losartan  25 MG tablet Commonly known as: COZAAR  Take 25 mg by mouth daily at 6 (six) AM.   metoprolol  succinate 50 MG 24 hr tablet Commonly known as: TOPROL -XL TAKE 1 TABLET BY MOUTH DAILY. TAKE WITH OR IMMEDIATELY FOLLOWING A MEAL.   nitroGLYCERIN  0.4 MG SL tablet Commonly known as: NITROSTAT  PLACE 1 TABLET UNDER THE TONGUE EVERY 5 MINUTES AS NEEDED FOR CHEST PAIN.   Repatha  SureClick 140 MG/ML Soaj Generic drug: Evolocumab  INJECT 140 MG INTO THE SKIN EVERY 14 (FOURTEEN) DAYS.         Outstanding Labs/Studies PV studies as per Dr. Court  Duration of Discharge Encounter:  APP Time: 15 minutes   Signed, Dayna N Dunn, PA-C 02/27/2024, 12:25 PM  Donia JINNY Leek was seen by me today along with Dayna Dunn, PA-C. I have personally performed an evaluation on this patient.  My findings are as follows:  83 y.o. female with PAD and lifestyle limiting claudication of her left leg. Dr. Court performed lower ext angiography and found occlusion of the left popliteal artery with one vessel runoff to the foot via the left anterior tibial artery. PV intervention performed with angioplasty,  intravascular lithotripsy and drug coated balloon therapy.   Data: EKG(s) and pertinent labs, studies, etc were personally reviewed and interpreted by me:  Telemetry reviewed by me: sinus All labs reviewed by me Otherwise, I agree with data as outlined by the advanced practice provider.  Exam performed by me: Gen: NAD Neck: No JVD Cardiac:  RRR without murmur Lungs: clear bilaterally Extremities: No LE edema  My Assessment and Plan:  PAD, She is post PV intervention and doing well. Left popliteal artery occlusion treated with IVL and drug coated balloon angioplasty. Will d/c home today on ASA and Plavix . Follow up dopplers one week. F/U Dr. Court 2 weeks.   The patient will be discharged home today  I spent 20 minutes seeing the patient. During that time, I reviewed the labs, EKG, telemetry, angiography films,  evaluated their symptoms and performed an examination. This time also included plan formulation, discussion of the plan with the patient and the time spent with documentation.   Signed,  Lonni Cash, MD  02/27/2024 12:28 PM

## 2024-02-27 NOTE — Discharge Instructions (Signed)

## 2024-02-28 ENCOUNTER — Telehealth: Payer: Self-pay | Admitting: Student

## 2024-02-28 NOTE — Telephone Encounter (Signed)
   Patient called Kerri Snyder with concerns about swelling in her foot after PV intervention.  Called and spoke with patient.  She had PV intervention of left P2 segment with Dr. Wadie on 02/26/2024.  She was discharged yesterday and states that today she noticed that her left foot is swollen.  She states that the ball and top of her foot are swollen.  She has a little swelling in her left ankle as well.  She states the top of her foot is a little red and warm to the touch.  She  denies any injuries to that foot.  No fevers or chills.  She denies any swellings of her calf or groin.  I explained that it is hard to say anything definitively over the phone but this does not sound like it is secondary to her recent procedure and low suspicion for DVT given the swelling is primarily of her foot.  Recommended trying conservative measures such as elevating her foot/leg and icing her foot. Reviewed ED precautions.  Taegan Standage E Nasiah Polinsky, PA-C 02/28/2024 2:33 PM

## 2024-03-01 ENCOUNTER — Telehealth: Payer: Self-pay | Admitting: Cardiovascular Disease

## 2024-03-01 DIAGNOSIS — I739 Peripheral vascular disease, unspecified: Secondary | ICD-10-CM

## 2024-03-01 DIAGNOSIS — E782 Mixed hyperlipidemia: Secondary | ICD-10-CM

## 2024-03-01 DIAGNOSIS — I25119 Atherosclerotic heart disease of native coronary artery with unspecified angina pectoris: Secondary | ICD-10-CM

## 2024-03-01 MED ORDER — REPATHA SURECLICK 140 MG/ML ~~LOC~~ SOAJ: 140.0000 mg | SUBCUTANEOUS | 3 refills | Status: AC

## 2024-03-01 NOTE — Telephone Encounter (Signed)
 Patient called again to follow-up on getting Repatha  medication sent to [id number] - [place], [place] - [address]  Patient noted she is completely out of this medication.

## 2024-03-01 NOTE — Telephone Encounter (Signed)
*  STAT* If patient is at the pharmacy, call can be transferred to refill team.   1. Which medications need to be refilled? (please list name of each medication and dose if known)   Evolocumab  (REPATHA  SURECLICK) 140 MG/ML SOAJ    2. Which pharmacy/location (including street and city if local pharmacy) is medication to be sent to?  [id number] [place], [place] - [address]      3. Do they need a 30 day or 90 day supply? 90 day    Pt is out of medication

## 2024-03-02 NOTE — Telephone Encounter (Signed)
Refill sent to pharmacy yesterday.

## 2024-03-04 ENCOUNTER — Ambulatory Visit (HOSPITAL_BASED_OUTPATIENT_CLINIC_OR_DEPARTMENT_OTHER)
Admission: RE | Admit: 2024-03-04 | Discharge: 2024-03-04 | Disposition: A | Discharge status: Home / Self Care | Source: Ambulatory Visit | Attending: Cardiovascular Disease | Admitting: Cardiovascular Disease

## 2024-03-04 ENCOUNTER — Ambulatory Visit (HOSPITAL_COMMUNITY)
Admission: RE | Admit: 2024-03-04 | Discharge: 2024-03-04 | Disposition: A | Discharge status: Home / Self Care | Source: Ambulatory Visit | Attending: Cardiovascular Disease | Admitting: Cardiovascular Disease

## 2024-03-04 DIAGNOSIS — I739 Peripheral vascular disease, unspecified: Secondary | ICD-10-CM | POA: Diagnosis present

## 2024-03-04 LAB — VAS US ABI WITH/WO TBI
Left ABI: 0.78
Right ABI: 0.86

## 2024-03-15 ENCOUNTER — Ambulatory Visit: Attending: Cardiovascular Disease | Admitting: Cardiovascular Disease

## 2024-03-15 ENCOUNTER — Encounter: Payer: Self-pay | Admitting: Cardiovascular Disease

## 2024-03-15 VITALS — BP 118/58 | HR 70 | Ht 64.0 in | Wt 142.4 lb

## 2024-03-15 DIAGNOSIS — I739 Peripheral vascular disease, unspecified: Secondary | ICD-10-CM | POA: Diagnosis not present

## 2024-03-15 NOTE — Assessment & Plan Note (Signed)
 History of PAD with lifestyle-limiting claudication.  She failed Pletal  therapy.  Dopplers performed 01/27/2024 showed a right ABI of 0.98 with a high-frequency signal of the distal right SFA although she was asymptomatic on that side.  Her left ABI was 0.52 with what appeared to be a subtotally occluded left popliteal artery.  I performed peripheral angiography on her 02/26/2024 and performed shockwave balloon angioplasty followed by IV L and DCB with a 4 mm x 60 mm long Ranger.  She had a small nonflow limiting linear dissection.  She was discharged home the following day.  Her postprocedure ABIs revealed an increase in her left ABI from 0.52 up to 0.78.  Her claudication has resolved.  We will continue to follow her noninvasively in 6 months.

## 2024-03-15 NOTE — Patient Instructions (Signed)
 Medication Instructions:  Your physician recommends that you continue on your current medications as directed. Please refer to the Current Medication list given to you today.  *If you need a refill on your cardiac medications before your next appointment, please call your pharmacy*  Testing/Procedures: Your physician has requested that you have a lower extremity arterial duplex. During this test, ultrasound is used to evaluate arterial blood flow in the legs. Allow one hour for this exam. There are no restrictions or special instructions. This will take place at [address] 4th floor **To do in 6 months**  Please note: We ask at that you not bring children with you during ultrasound (echo/ vascular) testing. Due to room size and safety concerns, children are not allowed in the ultrasound rooms during exams. Our front office staff cannot provide observation of children in our lobby area while testing is being conducted. An adult accompanying a patient to their appointment will only be allowed in the ultrasound room at the discretion of the ultrasound technician under special circumstances. We apologize for any inconvenience.   Your physician has requested that you have an ankle brachial index (ABI). During this test an ultrasound and blood pressure cuff are used to evaluate the arteries that supply the arms and legs with blood. Allow thirty minutes for this exam. There are no restrictions or special instructions. This will take place at [address] 4th floor   **To do in 6 months**   Please note: We ask at that you not bring children with you during ultrasound (echo/ vascular) testing. Due to room size and safety concerns, children are not allowed in the ultrasound rooms during exams. Our front office staff cannot provide observation of children in our lobby area while testing is being conducted. An adult accompanying a patient to their appointment will only be allowed in the ultrasound room  at the discretion of the ultrasound technician under special circumstances. We apologize for any inconvenience.   Follow-Up: At [facility], you and your health needs are our priority.  As part of our continuing mission to provide you with exceptional heart care, our providers are all part of one team.  This team includes your primary Cardiologist (physician) and Advanced Practice Providers or APPs (Physician Assistants and Nurse Practitioners) who all work together to provide you with the care you need, when you need it.  Your next appointment:   12 month(s) (PV only)  Provider:   Dorn Lesches, MD    We recommend signing up for the patient portal called MyChart.  Sign up information is provided on this After Visit Summary.  MyChart is used to connect with patients for Virtual Visits (Telemedicine).  Patients are able to view lab/test results, encounter notes, upcoming appointments, etc.  Non-urgent messages can be sent to your provider as well.   To learn more about what you can do with MyChart, go to [email or web address].

## 2024-03-15 NOTE — Progress Notes (Signed)
 03/15/2024 Kerri Snyder   [birth year]  [id number]  Primary Physician Claudene Pellet, MD Primary Cardiologist: Dorn [name] [name], [name]  HPI:  Kerri Snyder is a 83 y.o.  thin-appearing widowed Caucasian female mother of 1 son, grandmother of 3 grandchildren who continues to work at age 61 at the [employer] .  She was referred by Glendia Ferrier, PA-C for peripheral vascular evaluation because of lifestyle-limiting claudication.  Her primary cardiologist is Dr. Oneil Parchment.  She is accompanied by her friend Olam Genre.  I last saw her in the office 02/02/2024.  She does have a history of CAD status post LAD and RCA intervention in the past (4, 5/21).  She has heart failure with reduced EF and moderate to severe MR as well as history of hypertension, and hyperlipidemia.  She is legally blind.  She lives with her grandson and gets out of the house rarely.  She does say however that she is limited by claudication left greater than right for several years.  She did have Doppler studies performed 01/28/2023 revealing a right ABI of 0.81, left of 0.74.  She had moderate disease in her right popliteal artery and an occluded right PTA.  She had high-grade left tibioperoneal trunk stenosis and an occluded left posterior tibial artery.    She failed empiric Pletal  therapy.  Her Dopplers studies performed 01/27/2024 revealed a right ABI of 0.98 with a high-frequency signal in the distal right SFA although she is asymptomatic on that side.  Her left ABI 0.52 with what appears to be a subtotally occluded popliteal artery.  She wishes to proceed with outpatient peripheral angiography and endovascular therapy for lifestyle-limiting claudication.  I performed peripheral angiography on her 02/26/2024 revealing a total P1 segment of the left popliteal artery with one-vessel runoff via the anterior tibial.  She did have a 70% segmental mid right SFA stenosis.  I performed  chocolate balloon angioplasties followed by shockwave angioplasty and DCB resulting reduction of 100% occlusion to less than 30% residual with a small linear dissection.  She was discharged home the following day.  Her most recent postprocedure Doppler studies revealed an increase in her left ABI from 0.5-0.78.  Her claudication has resolved.   Current Meds  Medication Sig   acetaminophen  (TYLENOL ) 325 MG tablet Take 650 mg by mouth every 6 (six) hours as needed for moderate pain (pain score 4-6).   ALPRAZolam  (XANAX ) 0.25 MG tablet Take 0.25 mg by mouth 2 (two) times daily. (Patient taking differently: Take 0.25 mg by mouth at bedtime.)   aspirin  EC 81 MG tablet Take 1 tablet (81 mg total) by mouth daily. Swallow whole.   calcium  carbonate (TUMS - DOSED IN MG ELEMENTAL CALCIUM ) 500 MG chewable tablet Chew 1 tablet by mouth daily as needed for indigestion or heartburn.   clopidogrel  (PLAVIX ) 75 MG tablet Take 1 tablet (75 mg total) by mouth daily with breakfast.   Evolocumab  (REPATHA  SURECLICK) 140 MG/ML SOAJ Inject 140 mg into the skin every 14 (fourteen) days.   hydrochlorothiazide  (HYDRODIURIL ) 25 MG tablet Take 25 mg by mouth daily.   hydroxypropyl methylcellulose (ISOPTO TEARS) 2.5 % ophthalmic solution Place 1 drop into both eyes 4 (four) times daily as needed for dry eyes.   losartan  (COZAAR ) 25 MG tablet Take 25 mg by mouth daily at 6 (six) AM.   metoprolol  succinate (TOPROL -XL) 50 MG 24 hr tablet TAKE 1 TABLET BY MOUTH DAILY.  TAKE WITH OR IMMEDIATELY FOLLOWING A MEAL.   nitroGLYCERIN  (NITROSTAT ) 0.4 MG SL tablet PLACE 1 TABLET UNDER THE TONGUE EVERY 5 MINUTES AS NEEDED FOR CHEST PAIN.   vitamin B-12 (CYANOCOBALAMIN) 1000 MCG tablet Take 1,000 mcg by mouth daily.     Allergies  Allergen Reactions   Pitavastatin     Other Reaction(s): severe leg cramps   Rosuvastatin      Other Reaction(s): leg pain and stomach upset    Social History   Socioeconomic History   Marital status:  Married    Spouse name: Not on file   Number of children: Not on file   Years of education: Not on file   Highest education level: Not on file  Occupational History   Not on file  Tobacco Use   Smoking status: Never   Smokeless tobacco: Never  Vaping Use   Vaping status: Never Used  Substance and Sexual Activity   Alcohol use: No    Alcohol/week: 0.0 standard drinks of alcohol   Drug use: No   Sexual activity: Never  Other Topics Concern   Not on file  Social History Narrative   Not on file   Social Drivers of Health   Financial Resource Strain: Not on file  Food Insecurity: No Food Insecurity (02/26/2024)   Hunger Vital Sign    Worried About Running Out of Food in the Last Year: Never true    Ran Out of Food in the Last Year: Never true  Transportation Needs: No Transportation Needs (02/26/2024)   PRAPARE - Administrator, Civil Service (Medical): No    Lack of Transportation (Non-Medical): No  Physical Activity: Not on file  Stress: Not on file  Social Connections: Unknown (02/26/2024)   Social Connection and Isolation Panel    Frequency of Communication with Friends and Family: More than three times a week    Frequency of Social Gatherings with Friends and Family: More than three times a week    Attends Religious Services: Not on file    Active Member of Clubs or Organizations: Not on file    Attends Banker Meetings: Not on file    Marital Status: Not on file  Intimate Partner Violence: Unknown (02/26/2024)   Humiliation, Afraid, Rape, and Kick questionnaire    Fear of Current or Ex-Partner: No    Emotionally Abused: No    Physically Abused: Not on file    Sexually Abused: Not on file     Review of Systems: General: negative for chills, fever, night sweats or weight changes.  Cardiovascular: negative for chest pain, dyspnea on exertion, edema, orthopnea, palpitations, paroxysmal nocturnal dyspnea or shortness of breath Dermatological:  negative for rash Respiratory: negative for cough or wheezing Urologic: negative for hematuria Abdominal: negative for nausea, vomiting, diarrhea, bright red blood per rectum, melena, or hematemesis Neurologic: negative for visual changes, syncope, or dizziness All other systems reviewed and are otherwise negative except as noted above.    Blood pressure (!) 118/58, pulse 70, height 5' 4 (1.626 m), weight 142 lb 6.4 oz (64.6 kg), SpO2 92%.  General appearance: alert and no distress Neck: no adenopathy, no carotid bruit, no JVD, supple, symmetrical, trachea midline, and thyroid not enlarged, symmetric, no tenderness/mass/nodules Lungs: clear to auscultation bilaterally Heart: regular rate and rhythm, S1, S2 normal, no murmur, click, rub or gallop Extremities: extremities normal, atraumatic, no cyanosis or edema Pulses: Decreased pedal pulses Skin: Skin color, texture, turgor normal. No rashes or lesions Neurologic:  Grossly normal  EKG not performed today      ASSESSMENT AND PLAN:   Peripheral vascular disease History of PAD with lifestyle-limiting claudication.  She failed Pletal  therapy.  Dopplers performed 01/27/2024 showed a right ABI of 0.98 with a high-frequency signal of the distal right SFA although she was asymptomatic on that side.  Her left ABI was 0.52 with what appeared to be a subtotally occluded left popliteal artery.  I performed peripheral angiography on her 02/26/2024 and performed shockwave balloon angioplasty followed by IV L and DCB with a 4 mm x 60 mm long Ranger.  She had a small nonflow limiting linear dissection.  She was discharged home the following day.  Her postprocedure ABIs revealed an increase in her left ABI from 0.52 up to 0.78.  Her claudication has resolved.  We will continue to follow her noninvasively in 6 months.     Dorn DOROTHA Lesches MD FACP,FACC,FAHA, [facility] 03/15/2024 3:44 PM
